# Patient Record
Sex: Male | Born: 1970
Health system: Southern US, Community
[De-identification: ages and names within clinical notes are randomized; demographics above are authoritative.]

## PROBLEM LIST (undated history)

## (undated) DIAGNOSIS — E119 Type 2 diabetes mellitus without complications: Secondary | ICD-10-CM

## (undated) DIAGNOSIS — I10 Essential (primary) hypertension: Secondary | ICD-10-CM

---

## 2017-05-26 ENCOUNTER — Ambulatory Visit (HOSPITAL_COMMUNITY)
Admission: EM | Admit: 2017-05-26 | Discharge: 2017-05-26 | Disposition: A | Discharge status: Home / Self Care | Payer: Self-pay | Attending: Family Medicine | Admitting: Family Medicine

## 2017-05-26 ENCOUNTER — Encounter (HOSPITAL_COMMUNITY): Payer: Self-pay | Admitting: Emergency Medicine

## 2017-05-26 DIAGNOSIS — I10 Essential (primary) hypertension: Secondary | ICD-10-CM

## 2017-05-26 DIAGNOSIS — E119 Type 2 diabetes mellitus without complications: Secondary | ICD-10-CM

## 2017-05-26 HISTORY — DX: Type 2 diabetes mellitus without complications: E11.9

## 2017-05-26 HISTORY — DX: Essential (primary) hypertension: I10

## 2017-05-26 NOTE — Discharge Instructions (Signed)
I have attached the contact information for [facility], contact them to schedule an appointment to establish for primary care.   For your diabetes and hypertension, I recommend eating fresh fruits and vegetables, avoid high salt foods, fried foods, increase daily exercise, and drink lots of plain water, avoid sweet tea.  You will need to follow up with a primary care provider for labs and to start medications.

## 2017-05-26 NOTE — ED Triage Notes (Signed)
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  Documentation shows high blood pressure and abnormal blood glucose readings.  Sent to this location for treatment

## 2017-05-26 NOTE — ED Provider Notes (Signed)
CSN: [id number]     Arrival date & time 05/26/17  1643 History   First MD Initiated Contact with Patient 05/26/17 1805     Chief Complaint  Patient presents with  . Hypertension  . Diabetes   (Consider location/radiation/quality/duration/timing/severity/associated sxs/prior Treatment) Paul Roberson is a 46 y.o. male with past hx DMII and HTN who [facility] with a chief complaint of unctrolled DM and HTN. 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 HgbA1c 8.2, BP at their clinic 170/120. Denies any symptoms. Was previously on medication several years ago but stopped after having acute renal failure.    The history is provided by the patient.  Hypertension   Diabetes     Past Medical History:  Diagnosis Date  . Diabetes mellitus without complication (HCC)   . Hypertension    No past surgical history on file. No family history on file. Social History  Substance Use Topics  . Smoking status: Current Some Day Smoker  . Smokeless tobacco: Not on file  . Alcohol use Yes    Review of Systems  Constitutional: Negative.   HENT: Negative.   Respiratory: Negative.   Cardiovascular: Negative.   Gastrointestinal: Negative.   Musculoskeletal: Negative.   Skin: Negative.   Neurological: Negative.     Allergies  Patient has no known allergies.  Home Medications   Prior to Admission medications   Not on File   Meds Ordered and Administered this Visit  Medications - No data to display  BP (!) 155/90 (BP Location: Right Arm) Comment: rn notified  Pulse (!) 109 Comment: notified rn  Temp 98.7 F (37.1 C) (Oral)   Resp 16   SpO2 100%  No data found.   Physical Exam  Constitutional: He is oriented to person, place, and time. He appears well-developed and well-nourished. No distress.  HENT:  Head: Normocephalic and atraumatic.  Right Ear: External ear normal.  Left Ear: External ear normal.  Eyes:  Conjunctivae are normal.  Neck: Normal range of motion.  Cardiovascular: Normal rate and regular rhythm.   Pulmonary/Chest: Effort normal and breath sounds normal.  Musculoskeletal: He exhibits no edema.  Neurological: He is alert and oriented to person, place, and time.  Skin: Skin is warm and dry. Capillary refill takes less than 2 seconds. He is not diaphoretic.  Psychiatric: He has a normal mood and affect. His behavior is normal.  Nursing note and vitals reviewed.   Urgent Care Course     Procedures (including critical care time)  Labs Review Labs Reviewed - No data to display  Imaging Review No results found.     MDM   1. Essential hypertension   2. Type 2 diabetes mellitus without complication, without long-term current use of insulin (HCC)     Pt referred to [facility] to establish for primary care. Declined to start treatment at this time, follow up with a PCP for management. Provided counseling on non-pharm management of these diseases such as diet and exercise.    Dorena BodoKennard, Karron Alvizo, NP 05/26/17 [phone]

## 2017-05-26 NOTE — ED Notes (Signed)
Patient was made aware at in take that providers would evaluated dm and htn including give guidance to securing a pcp.  Patient aware that paperwork would be providers discretion and NOT a certainty.

## 2017-05-28 ENCOUNTER — Ambulatory Visit (INDEPENDENT_AMBULATORY_CARE_PROVIDER_SITE_OTHER): Payer: Self-pay | Admitting: Emergency Medicine

## 2017-05-28 ENCOUNTER — Telehealth: Payer: Self-pay

## 2017-05-28 ENCOUNTER — Encounter: Payer: Self-pay | Admitting: Emergency Medicine

## 2017-05-28 VITALS — BP 134/90 | HR 69 | Temp 98.2°F | Resp 16 | Ht 69.0 in | Wt 238.4 lb

## 2017-05-28 DIAGNOSIS — E119 Type 2 diabetes mellitus without complications: Secondary | ICD-10-CM

## 2017-05-28 DIAGNOSIS — E1159 Type 2 diabetes mellitus with other circulatory complications: Secondary | ICD-10-CM | POA: Insufficient documentation

## 2017-05-28 DIAGNOSIS — I10 Essential (primary) hypertension: Secondary | ICD-10-CM

## 2017-05-28 MED ORDER — AMLODIPINE BESYLATE 5 MG PO TABS: 5.0000 mg | ORAL_TABLET | Freq: Every day | ORAL | 3 refills | Status: DC

## 2017-05-28 MED ORDER — METFORMIN HCL 500 MG PO TABS: 500.0000 mg | ORAL_TABLET | Freq: Two times a day (BID) | ORAL | 3 refills | Status: DC

## 2017-05-28 NOTE — Progress Notes (Signed)
Paul Roberson 46 y.o.   Chief Complaint  Patient presents with  . Establish Care    Sugar and BP check    HISTORY OF PRESENT ILLNESS: This is a 46 y.o. 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 Asymptomatic. BP was 170/120 and A1C was 8.7 with glucose 260.  HPI   Prior to Admission medications   Not on File    Allergies  Allergen Reactions  . Penicillins Itching    There are no active problems to display for this patient.   Past Medical History:  Diagnosis Date  . Diabetes mellitus without complication (HCC)   . Hypertension     History reviewed. No pertinent surgical history.  Social History   Social History  . Marital status: Married    Spouse name: N/A  . Number of children: N/A  . Years of education: N/A   Occupational History  . Not on file.   Social History Main Topics  . Smoking status: Current Some Day Smoker  . Smokeless tobacco: Never Used  . Alcohol use Yes  . Drug use: No  . Sexual activity: Not on file   Other Topics Concern  . Not on file   Social History Narrative  . No narrative on file    History reviewed. No pertinent family history.   Review of Systems  Constitutional: Negative.  Negative for chills and fever.  HENT: Negative.   Eyes: Negative.  Negative for blurred vision and double vision.  Respiratory: Negative.  Negative for cough, shortness of breath and wheezing.   Cardiovascular: Negative.  Negative for chest pain, palpitations, claudication and leg swelling.  Gastrointestinal: Negative.  Negative for abdominal pain, nausea and vomiting.  Genitourinary: Negative.  Negative for dysuria and hematuria.  Musculoskeletal: Negative.  Negative for back pain, myalgias and neck pain.  Skin: Negative.  Negative for rash.       Rash to both feet.  Neurological: Negative.  Negative for dizziness, sensory change, focal weakness and headaches.    Endo/Heme/Allergies: Negative.   All other systems reviewed and are negative.  Vitals:   05/28/17 0812  BP: 134/90  Pulse: 69  Resp: 16  Temp: 98.2 F (36.8 C)     Physical Exam  Constitutional: He is oriented to person, place, and time. He appears well-developed and well-nourished.  HENT:  Head: Normocephalic and atraumatic.  Nose: Nose normal.  Mouth/Throat: Oropharynx is clear and moist. No oropharyngeal exudate.  Eyes: Pupils are equal, round, and reactive to light. Conjunctivae and EOM are normal.  Neck: Normal range of motion. Neck supple. No JVD present. No thyromegaly present.  Cardiovascular: Normal rate, regular rhythm, normal heart sounds and intact distal pulses.   Pulmonary/Chest: Effort normal and breath sounds normal.  Abdominal: Soft. Bowel sounds are normal. He exhibits no distension. There is no tenderness.  Musculoskeletal: Normal range of motion.  Lymphadenopathy:    He has no cervical adenopathy.  Neurological: He is alert and oriented to person, place, and time. No sensory deficit. He exhibits normal muscle tone.  Skin: Skin is warm and dry. Capillary refill takes less than 2 seconds. Rash (both feet; tinea pedis) noted.  Psychiatric: He has a normal mood and affect. His behavior is normal.  Vitals reviewed.    ASSESSMENT & PLAN: Paul Roberson was seen today for establish care.  Diagnoses and all orders for this visit:  Essential hypertension -  Basic metabolic panel -     CBC -     amLODipine (NORVASC) 5 MG tablet; Take 1 tablet (5 mg total) by mouth daily.  Type 2 diabetes mellitus without complication, without long-term current use of insulin (HCC) -     Hemoglobin A1c -     metFORMIN (GLUCOPHAGE) 500 MG tablet; Take 1 tablet (500 mg total) by mouth 2 (two) times daily with a meal.    Patient Instructions    IF you received an x-ray today, you will receive an invoice from [facility]. Please contact [facility] at  [phone] with questions or concerns regarding your invoice.   IF you received labwork today, you will receive an invoice from [facility]. Please contact [facility] at [phone] with questions or concerns regarding your invoice.   Our billing staff will not be able to assist you with questions regarding bills from these companies.  You will be contacted with the lab results as soon as they are available. The fastest way to get your results is to activate your My Chart account. Instructions are located on the last page of this paperwork. If you have not heard from [place] regarding the results in 2 weeks, please contact this office.      Type 2 Diabetes Mellitus, Diagnosis, Adult Type 2 diabetes (type 2 diabetes mellitus) is a long-term (chronic) disease. It may be caused by one or both of these problems:  Your body does not make enough of a hormone called insulin.  Your body does not react in a normal way to insulin that it makes.  Insulin lets sugars (glucose) go into cells in the body. This gives you energy. If you have type 2 diabetes, sugars cannot get into cells. This causes high blood sugar (hyperglycemia). Your doctor will set treatment goals for you. Generally, you should have these blood sugar levels:  Before meals (preprandial): 80-130 mg/dL (9.5-6.2 mmol/L).  After meals (postprandial): below 180 mg/dL (10 mmol/L).  A1c (hemoglobin A1c) level: less than 7%.  Follow these instructions at home: Questions to Ask Your Doctor  You may want to ask these questions:  Do I need to meet with a diabetes educator?  Where can I find a support group for people with diabetes?  What equipment will I need to care for myself at home?  What diabetes medicines do I need? When should I take them?  How often do I need to check my blood sugar?  What number can I call if I have questions?  When is my next doctor's visit?  General instructions  Take over-the-counter and prescription  medicines only as told by your doctor.  Keep all follow-up visits as told by your doctor. This is important. Contact a doctor if:  Your blood sugar is at or above 240 mg/dL (13.0 mmol/L) for 2 days in a row.  You have been sick or have had a fever for 2 days or more and you are not getting better.  You have any of these problems for more than 6 hours: ? You cannot eat or drink. ? You feel sick to your stomach (nauseous). ? You throw up (vomit). ? You have watery poop (diarrhea). Get help right away if:  Your blood sugar is lower than 54 mg/dL (3 mmol/L).  You get confused.  You have trouble: ? Thinking clearly. ? Breathing.  You have moderate or large ketone levels in your pee (urine). This information is not intended to replace advice given to you by  your health care provider. Make sure you discuss any questions you have with your health care provider. Document Released: 08/12/2008 Document Revised: 04/10/2016 Document Reviewed: 12/07/2015 Elsevier Interactive Patient Education  2018 [employer]  Hypertension Hypertension is another name for high blood pressure. High blood pressure forces your heart to work harder to pump blood. This can cause problems over time. There are two numbers in a blood pressure reading. There is a top number (systolic) over a bottom number (diastolic). It is best to have a blood pressure below 120/80. Healthy choices can help lower your blood pressure. You may need medicine to help lower your blood pressure if:  Your blood pressure cannot be lowered with healthy choices.  Your blood pressure is higher than 130/80.  Follow these instructions at home: Eating and drinking  If directed, follow the DASH eating plan. This diet includes: ? Filling half of your plate at each meal with fruits and vegetables. ? Filling one quarter of your plate at each meal with whole grains. Whole grains include whole wheat pasta, brown rice, and whole grain  bread. ? Eating or drinking low-fat dairy products, such as skim milk or low-fat yogurt. ? Filling one quarter of your plate at each meal with low-fat (lean) proteins. Low-fat proteins include fish, skinless chicken, eggs, beans, and tofu. ? Avoiding fatty meat, cured and processed meat, or chicken with skin. ? Avoiding premade or processed food.  Eat less than 1,500 mg of salt (sodium) a day.  Limit alcohol use to no more than 1 drink a day for nonpregnant women and 2 drinks a day for men. One drink equals 12 oz of beer, 5 oz of wine, or 1 oz of hard liquor. Lifestyle  Work with your doctor to stay at a healthy weight or to lose weight. Ask your doctor what the best weight is for you.  Get at least 30 minutes of exercise that causes your heart to beat faster (aerobic exercise) most days of the week. This may include walking, swimming, or biking.  Get at least 30 minutes of exercise that strengthens your muscles (resistance exercise) at least 3 days a week. This may include lifting weights or pilates.  Do not use any products that contain nicotine or tobacco. This includes cigarettes and e-cigarettes. If you need help quitting, ask your doctor.  Check your blood pressure at home as told by your doctor.  Keep all follow-up visits as told by your doctor. This is important. Medicines  Take over-the-counter and prescription medicines only as told by your doctor. Follow directions carefully.  Do not skip doses of blood pressure medicine. The medicine does not work as well if you skip doses. Skipping doses also puts you at risk for problems.  Ask your doctor about side effects or reactions to medicines that you should watch for. Contact a doctor if:  You think you are having a reaction to the medicine you are taking.  You have headaches that keep coming back (recurring).  You feel dizzy.  You have swelling in your ankles.  You have trouble with your vision. Get help right away  if:  You get a very bad headache.  You start to feel confused.  You feel weak or numb.  You feel faint.  You get very bad pain in your: ? Chest. ? Belly (abdomen).  You throw up (vomit) more than once.  You have trouble breathing. Summary  Hypertension is another name for high blood pressure.  Making healthy choices  can help lower blood pressure. If your blood pressure cannot be controlled with healthy choices, you may need to take medicine. This information is not intended to replace advice given to you by your health care provider. Make sure you discuss any questions you have with your health care provider. Document Released: 04/21/2008 Document Revised: 10/01/2016 Document Reviewed: 10/01/2016 Elsevier Interactive Patient Education  2018 Elsevier Inc.      Edwina Barth, MD [facility]

## 2017-05-28 NOTE — Telephone Encounter (Signed)
Pt states that the pharmacy had incorrect DOB. Called pharmacy to resolve for Rx. Pharmacy was able to correct.

## 2017-05-28 NOTE — Patient Instructions (Addendum)
   IF you received an x-ray today, you will receive an invoice from [facility]. Please contact [facility] at [phone] with questions or concerns regarding your invoice.   IF you received labwork today, you will receive an invoice from [facility]. Please contact [facility] at [phone] with questions or concerns regarding your invoice.   Our billing staff will not be able to assist you with questions regarding bills from these companies.  You will be contacted with the lab results as soon as they are available. The fastest way to get your results is to activate your My Chart account. Instructions are located on the last page of this paperwork. If you have not heard from us regarding the results in 2 weeks, please contact this office.      Type 2 Diabetes Mellitus, Diagnosis, Adult Type 2 diabetes (type 2 diabetes mellitus) is a long-term (chronic) disease. It may be caused by one or both of these problems:  Your body does not make enough of a hormone called insulin.  Your body does not react in a normal way to insulin that it makes.  Insulin lets sugars (glucose) go into cells in the body. This gives you energy. If you have type 2 diabetes, sugars cannot get into cells. This causes high blood sugar (hyperglycemia). Your doctor will set treatment goals for you. Generally, you should have these blood sugar levels:  Before meals (preprandial): 80-130 mg/dL (4.4-7.2 mmol/L).  After meals (postprandial): below 180 mg/dL (10 mmol/L).  A1c (hemoglobin A1c) level: less than 7%.  Follow these instructions at home: Questions to Ask Your Doctor  You may want to ask these questions:  Do I need to meet with a diabetes educator?  Where can I find a support group for people with diabetes?  What equipment will I need to care for myself at home?  What diabetes medicines do I need? When should I take them?  How often do I need to check my blood sugar?  What number  can I call if I have questions?  When is my next doctor's visit?  General instructions  Take over-the-counter and prescription medicines only as told by your doctor.  Keep all follow-up visits as told by your doctor. This is important. Contact a doctor if:  Your blood sugar is at or above 240 mg/dL (13.3 mmol/L) for 2 days in a row.  You have been sick or have had a fever for 2 days or more and you are not getting better.  You have any of these problems for more than 6 hours: ? You cannot eat or drink. ? You feel sick to your stomach (nauseous). ? You throw up (vomit). ? You have watery poop (diarrhea). Get help right away if:  Your blood sugar is lower than 54 mg/dL (3 mmol/L).  You get confused.  You have trouble: ? Thinking clearly. ? Breathing.  You have moderate or large ketone levels in your pee (urine). This information is not intended to replace advice given to you by your health care provider. Make sure you discuss any questions you have with your health care provider. Document Released: 08/12/2008 Document Revised: 04/10/2016 Document Reviewed: 12/07/2015 Elsevier Interactive Patient Education  2018 [employer] Inc.  Hypertension Hypertension is another name for high blood pressure. High blood pressure forces your heart to work harder to pump blood. This can cause problems over time. There are two numbers in a blood pressure reading. There is a top number (systolic) over a bottom   number (diastolic). It is best to have a blood pressure below 120/80. Healthy choices can help lower your blood pressure. You may need medicine to help lower your blood pressure if:  Your blood pressure cannot be lowered with healthy choices.  Your blood pressure is higher than 130/80.  Follow these instructions at home: Eating and drinking  If directed, follow the DASH eating plan. This diet includes: ? Filling half of your plate at each meal with fruits and vegetables. ? Filling one  quarter of your plate at each meal with whole grains. Whole grains include whole wheat pasta, brown rice, and whole grain bread. ? Eating or drinking low-fat dairy products, such as skim milk or low-fat yogurt. ? Filling one quarter of your plate at each meal with low-fat (lean) proteins. Low-fat proteins include fish, skinless chicken, eggs, beans, and tofu. ? Avoiding fatty meat, cured and processed meat, or chicken with skin. ? Avoiding premade or processed food.  Eat less than 1,500 mg of salt (sodium) a day.  Limit alcohol use to no more than 1 drink a day for nonpregnant women and 2 drinks a day for men. One drink equals 12 oz of beer, 5 oz of wine, or 1 oz of hard liquor. Lifestyle  Work with your doctor to stay at a healthy weight or to lose weight. Ask your doctor what the best weight is for you.  Get at least 30 minutes of exercise that causes your heart to beat faster (aerobic exercise) most days of the week. This may include walking, swimming, or biking.  Get at least 30 minutes of exercise that strengthens your muscles (resistance exercise) at least 3 days a week. This may include lifting weights or pilates.  Do not use any products that contain nicotine or tobacco. This includes cigarettes and e-cigarettes. If you need help quitting, ask your doctor.  Check your blood pressure at home as told by your doctor.  Keep all follow-up visits as told by your doctor. This is important. Medicines  Take over-the-counter and prescription medicines only as told by your doctor. Follow directions carefully.  Do not skip doses of blood pressure medicine. The medicine does not work as well if you skip doses. Skipping doses also puts you at risk for problems.  Ask your doctor about side effects or reactions to medicines that you should watch for. Contact a doctor if:  You think you are having a reaction to the medicine you are taking.  You have headaches that keep coming back  (recurring).  You feel dizzy.  You have swelling in your ankles.  You have trouble with your vision. Get help right away if:  You get a very bad headache.  You start to feel confused.  You feel weak or numb.  You feel faint.  You get very bad pain in your: ? Chest. ? Belly (abdomen).  You throw up (vomit) more than once.  You have trouble breathing. Summary  Hypertension is another name for high blood pressure.  Making healthy choices can help lower blood pressure. If your blood pressure cannot be controlled with healthy choices, you may need to take medicine. This information is not intended to replace advice given to you by your health care provider. Make sure you discuss any questions you have with your health care provider. Document Released: 04/21/2008 Document Revised: 10/01/2016 Document Reviewed: 10/01/2016 Elsevier Interactive Patient Education  2018 [employer]vier Inc.  

## 2017-05-29 LAB — HEMOGLOBIN A1C
ESTIMATED AVERAGE GLUCOSE: 235 mg/dL
Hgb A1c MFr Bld: 9.8 % — ABNORMAL HIGH (ref 4.8–5.6)

## 2017-05-29 LAB — BASIC METABOLIC PANEL
BUN / CREAT RATIO: 14 (ref 9–20)
BUN: 17 mg/dL (ref 6–24)
CHLORIDE: 103 mmol/L (ref 96–106)
CO2: 21 mmol/L (ref 20–29)
Calcium: 9.7 mg/dL (ref 8.7–10.2)
Creatinine, Ser: 1.18 mg/dL (ref 0.76–1.27)
GFR calc Af Amer: 87 mL/min/{1.73_m2} (ref >59)
GFR calc non Af Amer: 75 mL/min/{1.73_m2} (ref >59)
GLUCOSE: 281 mg/dL — AB (ref 65–99)
Potassium: 4.6 mmol/L (ref 3.5–5.2)
SODIUM: 141 mmol/L (ref 134–144)

## 2017-05-29 LAB — CBC
Hematocrit: 38.8 % (ref 37.5–51.0)
Hemoglobin: 12.5 g/dL — ABNORMAL LOW (ref 13.0–17.7)
MCH: 27.6 pg (ref 26.6–33.0)
MCHC: 32.2 g/dL (ref 31.5–35.7)
MCV: 86 fL (ref 79–97)
PLATELETS: 293 10*3/uL (ref 150–379)
RBC: 4.53 x10E6/uL (ref 4.14–5.80)
RDW: 15.4 % (ref 12.3–15.4)
WBC: 6.5 10*3/uL (ref 3.4–10.8)

## 2017-06-26 ENCOUNTER — Ambulatory Visit: Payer: Self-pay | Admitting: Emergency Medicine

## 2017-07-30 ENCOUNTER — Encounter: Payer: Self-pay | Admitting: Physician Assistant

## 2017-07-30 ENCOUNTER — Ambulatory Visit (INDEPENDENT_AMBULATORY_CARE_PROVIDER_SITE_OTHER): Payer: BLUE CROSS/BLUE SHIELD | Admitting: Physician Assistant

## 2017-07-30 VITALS — BP 147/95 | HR 109 | Resp 16 | Ht 69.0 in | Wt 239.2 lb

## 2017-07-30 DIAGNOSIS — E119 Type 2 diabetes mellitus without complications: Secondary | ICD-10-CM | POA: Diagnosis not present

## 2017-07-30 LAB — POCT GLYCOSYLATED HEMOGLOBIN (HGB A1C): Hemoglobin A1C: 9.5

## 2017-07-30 LAB — POCT URINALYSIS DIPSTICK
Bilirubin, UA: NEGATIVE
Blood, UA: NEGATIVE
GLUCOSE UA: NEGATIVE
Ketones, UA: NEGATIVE
NITRITE UA: NEGATIVE
Spec Grav, UA: >=1.03 — AB (ref 1.010–1.025)
UROBILINOGEN UA: 0.2 U/dL
pH, UA: 5.5 (ref 5.0–8.0)

## 2017-07-30 MED ORDER — AMLODIPINE BESYLATE 10 MG PO TABS: 10.0000 mg | ORAL_TABLET | Freq: Every day | ORAL | 3 refills | Status: DC

## 2017-07-30 MED ORDER — METFORMIN HCL 1000 MG PO TABS: 1000.0000 mg | ORAL_TABLET | Freq: Two times a day (BID) | ORAL | 3 refills | Status: DC

## 2017-07-30 NOTE — Patient Instructions (Signed)
Start the new metformin and try not to miss doses.  Start amlodipine 10 mg and try not to miss doses.

## 2017-07-30 NOTE — Progress Notes (Signed)
07/31/2017 11:25 AM   DOB: February 10, 1971 / MRN: [id number]  SUBJECTIVE:  Paul Roberson is a 46 y.o. male presenting for a1c recheck. Is taking Norvasc 5 mg and Metofomin 500.  Tells me that he has lost 60 lbs this year 2/2 dieting.  Tells me he has been taking 1 metformin daily.  Continues smoking. Takes norvasc daily.  Remains asymptomatic. No acute changes in vision. Mother here today and is requesting patient be started on multiple medications as she is a diabetic as well.   He is allergic to penicillins.   He  has a past medical history of Diabetes mellitus without complication (HCC) and Hypertension.    He  reports that he has been smoking.  He has never used smokeless tobacco. He reports that he drinks alcohol. He reports that he does not use drugs. He  has no sexual activity history on file. The patient  has no past surgical history on file.  His family history is not on file.  Review of Systems  Constitutional: Negative for chills, diaphoresis and fever.  Respiratory: Negative for cough, hemoptysis, sputum production, shortness of breath and wheezing.   Cardiovascular: Negative for chest pain, orthopnea and leg swelling.  Gastrointestinal: Negative for abdominal pain, blood in stool, constipation, diarrhea, heartburn, melena, nausea and vomiting.  Genitourinary: Negative for flank pain.  Skin: Negative for rash.  Neurological: Negative for dizziness.    The problem list and medications were reviewed and updated by myself where necessary and exist elsewhere in the encounter.   OBJECTIVE:  BP (!) 147/95 (BP Location: Right Arm, Patient Position: Sitting, Cuff Size: Large)   Pulse (!) 109   Resp 16   Ht  (1.753 m)   Wt 239 lb 3.2 oz (108.5 kg)   SpO2 98%   BMI 35.32 kg/m   Pulse Readings from Last 3 Encounters:  07/30/17 (!) 109  05/28/17 69  05/26/17 (!) 109   Lab Results  Component Value Date   CHOL 169 07/30/2017   HDL 48 07/30/2017   LDLCALC 88 07/30/2017   TRIG 163 (H) 07/30/2017   CHOLHDL 3.5 07/30/2017     Lab Results  Component Value Date   HGBA1C 9.5 07/30/2017     Physical Exam  Constitutional: He is oriented to person, place, and time. He appears well-developed. He is active and cooperative.  Non-toxic appearance.  Eyes: Pupils are equal, round, and reactive to light. EOM are normal.  Cardiovascular: Normal rate, regular rhythm, S1 normal, S2 normal, normal heart sounds, intact distal pulses and normal pulses.  Exam reveals no gallop and no friction rub.   No murmur heard. Pulmonary/Chest: Effort normal. No stridor. No tachypnea. No respiratory distress. He has no wheezes. He has no rales.  Abdominal: He exhibits no distension.  Musculoskeletal: He exhibits no edema.  Neurological: He is alert and oriented to person, place, and time. He has normal strength and normal reflexes. He is not disoriented. No cranial nerve deficit or sensory deficit. He exhibits normal muscle tone. Coordination and gait normal.  Skin: Skin is warm and dry. Rash (skn breakdown about the bilateral feet, no open wounds) noted. He is not diaphoretic. No pallor.  Psychiatric: His behavior is normal.  Vitals reviewed.   Results for orders placed or performed in visit on 07/30/17 (from the past 72 hour(s))  POCT glycosylated hemoglobin (Hb A1C)     Status: None   Collection Time: 07/30/17  3:05 PM  Result Value Ref Range  Hemoglobin A1C 9.5   POCT urinalysis dipstick     Status: Abnormal   Collection Time: 07/30/17  3:10 PM  Result Value Ref Range   Color, UA yellow    Clarity, UA clear    Glucose, UA negative    Bilirubin, UA negative    Ketones, UA negative    Spec Grav, UA >=1.030 (A) 1.010 - 1.025   Blood, UA negative    pH, UA 5.5 5.0 - 8.0   Protein, UA trace    Urobilinogen, UA 0.2 0.2 or 1.0 E.U./dL   Nitrite, UA negative    Leukocytes, UA Trace (A) Negative  Lipid Panel     Status: Abnormal   Collection Time: 07/30/17  3:49 PM  Result  Value Ref Range   Cholesterol, Total 169 100 - 199 mg/dL   Triglycerides 161163 (H) 0 - 149 mg/dL   HDL 48 >09>39 mg/dL   VLDL Cholesterol Cal 33 5 - 40 mg/dL   LDL Calculated 88 0 - 99 mg/dL   Chol/HDL Ratio 3.5 0.0 - 5.0 ratio    Comment:                                   T. Chol/HDL Ratio                                             Men  Women                               1/2 Avg.Risk  3.4    3.3                                   Avg.Risk  5.0    4.4                                2X Avg.Risk  9.6    7.1                                3X Avg.Risk 23.4   11.0    Lab Results  Component Value Date   CREATININE 1.18 05/28/2017   BUN 17 05/28/2017   NA 141 05/28/2017   K 4.6 05/28/2017   CL 103 05/28/2017   CO2 21 05/28/2017     No results found.  ASSESSMENT AND PLAN:  Paul Roberson was seen today for diabetes.  Diagnoses and all orders for this visit:  Type 2 diabetes mellitus without complication, without long-term current use of insulin (HCC): Smoker.  Hypertensive.  Uncontrolled diabetic. He knew about his diabetes for at leaast 1/2 a year before seeking treatment.  Feet look terrible.  He does not want diabetes education.  Doubling metformin.  Doubling Norvasc. Will see him back in about three weeks. Will be starting a statin and ASA at that time.  I have urged him to quit smoking.  There may be a depression underlying his chronic health issues and Wellbutrin would be a reasonable step forward if it could help him stop smoking, reduce his appetite, and treat  an underlying dysthymia. I am sending him to podiatry. Will need optho.  -     POCT glycosylated hemoglobin (Hb A1C) -     Microalbumin, urine -     POCT urinalysis dipstick -     Lipid Panel -     amLODipine (NORVASC) 10 MG tablet; Take 1 tablet (10 mg total) by mouth daily. -     metFORMIN (GLUCOPHAGE) 1000 MG tablet; Take 1 tablet (1,000 mg total) by mouth 2 (two) times daily with a meal. -     Ambulatory referral to  Podiatry    The patient is advised to call or return to clinic if he does not see an improvement in symptoms, or to seek the care of the closest emergency department if he worsens with the above plan.   Deliah Boston, MHS, PA-C [facility] 07/31/2017 11:25 AM

## 2017-07-31 LAB — MICROALBUMIN, URINE: MICROALBUM., U, RANDOM: 41.4 ug/mL

## 2017-07-31 LAB — LIPID PANEL
Chol/HDL Ratio: 3.5 ratio (ref 0.0–5.0)
Cholesterol, Total: 169 mg/dL (ref 100–199)
HDL: 48 mg/dL (ref >39)
LDL Calculated: 88 mg/dL (ref 0–99)
TRIGLYCERIDES: 163 mg/dL — AB (ref 0–149)
VLDL Cholesterol Cal: 33 mg/dL (ref 5–40)

## 2017-08-21 ENCOUNTER — Encounter: Payer: Self-pay | Admitting: Emergency Medicine

## 2017-08-21 ENCOUNTER — Ambulatory Visit (INDEPENDENT_AMBULATORY_CARE_PROVIDER_SITE_OTHER): Payer: BLUE CROSS/BLUE SHIELD | Admitting: Emergency Medicine

## 2017-08-21 VITALS — BP 130/70 | HR 97 | Temp 98.7°F | Resp 16 | Ht 68.25 in | Wt 235.6 lb

## 2017-08-21 DIAGNOSIS — E119 Type 2 diabetes mellitus without complications: Secondary | ICD-10-CM

## 2017-08-21 DIAGNOSIS — I10 Essential (primary) hypertension: Secondary | ICD-10-CM | POA: Diagnosis not present

## 2017-08-21 LAB — POCT GLYCOSYLATED HEMOGLOBIN (HGB A1C): Hemoglobin A1C: 8.5

## 2017-08-21 NOTE — Patient Instructions (Addendum)
  Must take Metformin twice a day.   IF you received an x-ray today, you will receive an invoice from [facility]. Please contact [facility] at [phone] with questions or concerns regarding your invoice.   IF you received labwork today, you will receive an invoice from [place]. Please contact [facility] at [phone] with questions or concerns regarding your invoice.   Our billing staff will not be able to assist you with questions regarding bills from these companies.  You will be contacted with the lab results as soon as they are available. The fastest way to get your results is to activate your My Chart account. Instructions are located on the last page of this paperwork. If you have not heard from [place] regarding the results in 2 weeks, please contact this office.     Type 2 Diabetes Mellitus, Diagnosis, Adult Type 2 diabetes (type 2 diabetes mellitus) is a long-term (chronic) disease. It may be caused by one or both of these problems:  Your body does not make enough of a hormone called insulin.  Your body does not react in a normal way to insulin that it makes.  Insulin lets sugars (glucose) go into cells in the body. This gives you energy. If you have type 2 diabetes, sugars cannot get into cells. This causes high blood sugar (hyperglycemia). Your doctor will set treatment goals for you. Generally, you should have these blood sugar levels:  Before meals (preprandial): 80-130 mg/dL (1.3-0.8 mmol/L).  After meals (postprandial): below 180 mg/dL (10 mmol/L).  A1c (hemoglobin A1c) level: less than 7%.  Follow these instructions at home: Questions to Ask Your Doctor  You may want to ask these questions:  Do I need to meet with a diabetes educator?  Where can I find a support group for people with diabetes?  What equipment will I need to care for myself at home?  What diabetes medicines do I need? When should I take them?  How often do I need to check my  blood sugar?  What number can I call if I have questions?  When is my next doctor's visit?  General instructions  Take over-the-counter and prescription medicines only as told by your doctor.  Keep all follow-up visits as told by your doctor. This is important. Contact a doctor if:  Your blood sugar is at or above 240 mg/dL (65.7 mmol/L) for 2 days in a row.  You have been sick or have had a fever for 2 days or more and you are not getting better.  You have any of these problems for more than 6 hours: ? You cannot eat or drink. ? You feel sick to your stomach (nauseous). ? You throw up (vomit). ? You have watery poop (diarrhea). Get help right away if:  Your blood sugar is lower than 54 mg/dL (3 mmol/L).  You get confused.  You have trouble: ? Thinking clearly. ? Breathing.  You have moderate or large ketone levels in your pee (urine). This information is not intended to replace advice given to you by your health care provider. Make sure you discuss any questions you have with your health care provider. Document Released: 08/12/2008 Document Revised: 04/10/2016 Document Reviewed: 12/07/2015 Elsevier Interactive Patient Education  [employer].

## 2017-08-21 NOTE — Assessment & Plan Note (Signed)
A1C still high at 8.5 but less than before (9.5). Pt was taking Metformin once a day instead of bid. Advised to take it with food twice a day.

## 2017-08-21 NOTE — Progress Notes (Signed)
Paul Roberson 46 y.o.   Chief Complaint  Patient presents with  . Follow-up    check diabetes  and blood pressure    HISTORY OF PRESENT ILLNESS: This is a 46 y.o. male here for follow up of diabetes and HTN; taking Norvasc 10 mg daily but only taking Metformin once a day. Asymptomatic.   HPI   Prior to Admission medications   Medication Sig Start Date End Date Taking? Authorizing Provider  amLODipine (NORVASC) 10 MG tablet Take 1 tablet (10 mg total) by mouth daily. 07/30/17  Yes Ofilia Neas, PA-C  metFORMIN (GLUCOPHAGE) 1000 MG tablet Take 1 tablet (1,000 mg total) by mouth 2 (two) times daily with a meal. 07/30/17  Yes Ofilia Neas, PA-C    Allergies  Allergen Reactions  . Penicillins Itching    Patient Active Problem List   Diagnosis Date Noted  . Essential hypertension 05/28/2017  . Type 2 diabetes mellitus without complication, without long-term current use of insulin (HCC) 05/28/2017    Past Medical History:  Diagnosis Date  . Diabetes mellitus without complication (HCC)   . Hypertension     No past surgical history on file.  Social History   Social History  . Marital status: Married    Spouse name: N/A  . Number of children: N/A  . Years of education: N/A   Occupational History  . Not on file.   Social History Main Topics  . Smoking status: Current Some Day Smoker  . Smokeless tobacco: Never Used  . Alcohol use Yes  . Drug use: No  . Sexual activity: Not on file   Other Topics Concern  . Not on file   Social History Narrative  . No narrative on file    No family history on file.   Review of Systems  Constitutional: Negative.  Negative for chills and fever.  HENT: Negative.  Negative for nosebleeds and sore throat.   Eyes: Negative for blurred vision, double vision, discharge and redness.  Respiratory: Negative.  Negative for cough and shortness of breath.   Cardiovascular: Negative.  Negative for chest pain and leg swelling.    Gastrointestinal: Negative for abdominal pain, diarrhea, nausea and vomiting.  Genitourinary: Negative.  Negative for dysuria and hematuria.  Musculoskeletal: Negative for myalgias and neck pain.  Skin: Negative.  Negative for rash.  Neurological: Negative.  Negative for dizziness, sensory change, focal weakness and headaches.  Endo/Heme/Allergies: Negative.   All other systems reviewed and are negative.  Vitals:   08/21/17 1445  BP: 130/70  Pulse: 97  Resp: 16  Temp: 98.7 F (37.1 C)  SpO2: 100%     Physical Exam  Constitutional: He is oriented to person, place, and time. He appears well-developed and well-nourished.  HENT:  Head: Normocephalic and atraumatic.  Right Ear: External ear normal.  Left Ear: External ear normal.  Nose: Nose normal.  Mouth/Throat: Oropharynx is clear and moist.  Eyes: Pupils are equal, round, and reactive to light. Conjunctivae and EOM are normal.  Neck: Normal range of motion. Neck supple. No JVD present. No thyromegaly present.  Cardiovascular: Normal rate, regular rhythm, normal heart sounds and intact distal pulses.   Pulmonary/Chest: Effort normal and breath sounds normal.  Abdominal: Soft. Bowel sounds are normal. He exhibits no distension. There is no tenderness.  Musculoskeletal: Normal range of motion.  Lymphadenopathy:    He has no cervical adenopathy.  Neurological: He is alert and oriented to person, place, and time. No sensory deficit. He  exhibits normal muscle tone.  Skin: Skin is warm and dry. Capillary refill takes less than 2 seconds. No rash noted.  Psychiatric: He has a normal mood and affect. His behavior is normal.  Vitals reviewed.  Lab Results  Component Value Date   HGBA1C 8.5 08/21/2017   HGBA1C 9.5 07/30/2017   HGBA1C 9.8 (H) 05/28/2017   Lab Results  Component Value Date   LDLCALC 88 07/30/2017   CREATININE 1.18 05/28/2017   Results for orders placed or performed in visit on 08/21/17 (from the past 24  hour(s))  POCT glycosylated hemoglobin (Hb A1C)     Status: None   Collection Time: 08/21/17  3:27 PM  Result Value Ref Range   Hemoglobin A1C 8.5    Essential hypertension Target BP reached. Well controlled on 10 mg Norvasc.   Type 2 diabetes mellitus without complication, without long-term current use of insulin (HCC) A1C still high at 8.5 but less than before (9.5). Pt was taking Metformin once a day instead of bid. Advised to take it with food twice a day.   A total of 30 minutes was spent in the room with the patient, greater than 50% of which was in counseling/coordination of care regarding treatment of HTN and DM.    Follow up in 3 months.  ASSESSMENT & PLAN: Wen was seen today for follow-up.  Diagnoses and all orders for this visit:  Type 2 diabetes mellitus without complication, without long-term current use of insulin (HCC) -     POCT glycosylated hemoglobin (Hb A1C)  Essential hypertension    Patient Instructions    Must take Metformin twice a day.   IF you received an x-ray today, you will receive an invoice from [facility]. Please contact [facility] at [phone] with questions or concerns regarding your invoice.   IF you received labwork today, you will receive an invoice from [place]. Please contact [facility] at [phone] with questions or concerns regarding your invoice.   Our billing staff will not be able to assist you with questions regarding bills from these companies.  You will be contacted with the lab results as soon as they are available. The fastest way to get your results is to activate your My Chart account. Instructions are located on the last page of this paperwork. If you have not heard from [place] regarding the results in 2 weeks, please contact this office.     Type 2 Diabetes Mellitus, Diagnosis, Adult Type 2 diabetes (type 2 diabetes mellitus) is a long-term (chronic) disease. It may be caused by one or both of  these problems:  Your body does not make enough of a hormone called insulin.  Your body does not react in a normal way to insulin that it makes.  Insulin lets sugars (glucose) go into cells in the body. This gives you energy. If you have type 2 diabetes, sugars cannot get into cells. This causes high blood sugar (hyperglycemia). Your doctor will set treatment goals for you. Generally, you should have these blood sugar levels:  Before meals (preprandial): 80-130 mg/dL (8.4-1.3 mmol/L).  After meals (postprandial): below 180 mg/dL (10 mmol/L).  A1c (hemoglobin A1c) level: less than 7%.  Follow these instructions at home: Questions to Ask Your Doctor  You may want to ask these questions:  Do I need to meet with a diabetes educator?  Where can I find a support group for people with diabetes?  What equipment will I need to care for myself at home?  What diabetes medicines do I need? When should I take them?  How often do I need to check my blood sugar?  What number can I call if I have questions?  When is my next doctor's visit?  General instructions  Take over-the-counter and prescription medicines only as told by your doctor.  Keep all follow-up visits as told by your doctor. This is important. Contact a doctor if:  Your blood sugar is at or above 240 mg/dL (19.1 mmol/L) for 2 days in a row.  You have been sick or have had a fever for 2 days or more and you are not getting better.  You have any of these problems for more than 6 hours: ? You cannot eat or drink. ? You feel sick to your stomach (nauseous). ? You throw up (vomit). ? You have watery poop (diarrhea). Get help right away if:  Your blood sugar is lower than 54 mg/dL (3 mmol/L).  You get confused.  You have trouble: ? Thinking clearly. ? Breathing.  You have moderate or large ketone levels in your pee (urine). This information is not intended to replace advice given to you by your health care  provider. Make sure you discuss any questions you have with your health care provider. Document Released: 08/12/2008 Document Revised: 04/10/2016 Document Reviewed: 12/07/2015 Elsevier Interactive Patient Education  2018 [employer]      Edwina Barth, MD [facility]

## 2017-08-21 NOTE — Assessment & Plan Note (Signed)
Target BP reached. Well controlled on 10 mg Norvasc.

## 2018-06-26 ENCOUNTER — Ambulatory Visit: Payer: BLUE CROSS/BLUE SHIELD | Admitting: Emergency Medicine

## 2018-06-29 ENCOUNTER — Other Ambulatory Visit: Payer: Self-pay

## 2018-06-29 ENCOUNTER — Encounter: Payer: Self-pay | Admitting: Family Medicine

## 2018-06-29 ENCOUNTER — Ambulatory Visit (INDEPENDENT_AMBULATORY_CARE_PROVIDER_SITE_OTHER): Payer: 59 | Admitting: Family Medicine

## 2018-06-29 ENCOUNTER — Telehealth: Payer: Self-pay | Admitting: Family Medicine

## 2018-06-29 VITALS — BP 138/90 | HR 100 | Temp 98.4°F | Ht 69.88 in | Wt 230.8 lb

## 2018-06-29 DIAGNOSIS — E119 Type 2 diabetes mellitus without complications: Secondary | ICD-10-CM

## 2018-06-29 DIAGNOSIS — Z23 Encounter for immunization: Secondary | ICD-10-CM | POA: Diagnosis not present

## 2018-06-29 DIAGNOSIS — I10 Essential (primary) hypertension: Secondary | ICD-10-CM | POA: Diagnosis not present

## 2018-06-29 DIAGNOSIS — G5701 Lesion of sciatic nerve, right lower limb: Secondary | ICD-10-CM

## 2018-06-29 DIAGNOSIS — Z114 Encounter for screening for human immunodeficiency virus [HIV]: Secondary | ICD-10-CM | POA: Diagnosis not present

## 2018-06-29 LAB — POCT GLYCOSYLATED HEMOGLOBIN (HGB A1C): Hemoglobin A1C: 11.5 % — AB (ref 4.0–5.6)

## 2018-06-29 MED ORDER — AMLODIPINE BESYLATE 10 MG PO TABS: 10.0000 mg | ORAL_TABLET | Freq: Every day | ORAL | 1 refills | Status: DC

## 2018-06-29 MED ORDER — CYCLOBENZAPRINE HCL 10 MG PO TABS: 10.0000 mg | ORAL_TABLET | Freq: Three times a day (TID) | ORAL | 0 refills | Status: DC | PRN

## 2018-06-29 MED ORDER — DULAGLUTIDE 0.75 MG/0.5ML ~~LOC~~ SOAJ: 0.7500 mg | SUBCUTANEOUS | 2 refills | Status: DC

## 2018-06-29 MED ORDER — IBUPROFEN 600 MG PO TABS: 600.0000 mg | ORAL_TABLET | Freq: Three times a day (TID) | ORAL | 0 refills | Status: DC | PRN

## 2018-06-29 MED ORDER — METFORMIN HCL ER (MOD) 1000 MG PO TB24: 1000.0000 mg | ORAL_TABLET | Freq: Every day | ORAL | 2 refills | Status: DC

## 2018-06-29 NOTE — Patient Instructions (Addendum)
   IF you received an x-ray today, you will receive an invoice from [facility]. Please contact [facility] at [phone] with questions or concerns regarding your invoice.   IF you received labwork today, you will receive an invoice from [facility]. Please contact [facility] at [phone] with questions or concerns regarding your invoice.   Our billing staff will not be able to assist you with questions regarding bills from these companies.  You will be contacted with the lab results as soon as they are available. The fastest way to get your results is to activate your My Chart account. Instructions are located on the last page of this paperwork. If you have not heard from us regarding the results in 2 weeks, please contact this office.     Diabetes Mellitus and Nutrition When you have diabetes (diabetes mellitus), it is very important to have healthy eating habits because your blood sugar (glucose) levels are greatly affected by what you eat and drink. Eating healthy foods in the appropriate amounts, at about the same times every day, can help you:  Control your blood glucose.  Lower your risk of heart disease.  Improve your blood pressure.  Reach or maintain a healthy weight.  Every person with diabetes is different, and each person has different needs for a meal plan. Your health care provider may recommend that you work with a diet and nutrition specialist (dietitian) to make a meal plan that is best for you. Your meal plan may vary depending on factors such as:  The calories you need.  The medicines you take.  Your weight.  Your blood glucose, blood pressure, and cholesterol levels.  Your activity level.  Other health conditions you have, such as heart or kidney disease.  How do carbohydrates affect me? Carbohydrates affect your blood glucose level more than any other type of food. Eating carbohydrates naturally increases the amount of glucose in your  blood. Carbohydrate counting is a method for keeping track of how many carbohydrates you eat. Counting carbohydrates is important to keep your blood glucose at a healthy level, especially if you use insulin or take certain oral diabetes medicines. It is important to know how many carbohydrates you can safely have in each meal. This is different for every person. Your dietitian can help you calculate how many carbohydrates you should have at each meal and for snack. Foods that contain carbohydrates include:  Bread, cereal, rice, pasta, and crackers.  Potatoes and corn.  Peas, beans, and lentils.  Milk and yogurt.  Fruit and juice.  Desserts, such as cakes, cookies, ice cream, and candy.  How does alcohol affect me? Alcohol can cause a sudden decrease in blood glucose (hypoglycemia), especially if you use insulin or take certain oral diabetes medicines. Hypoglycemia can be a life-threatening condition. Symptoms of hypoglycemia (sleepiness, dizziness, and confusion) are similar to symptoms of having too much alcohol. If your health care provider says that alcohol is safe for you, follow these guidelines:  Limit alcohol intake to no more than 1 drink per day for nonpregnant women and 2 drinks per day for men. One drink equals 12 oz of beer, 5 oz of wine, or 1 oz of hard liquor.  Do not drink on an empty stomach.  Keep yourself hydrated with water, diet soda, or unsweetened iced tea.  Keep in mind that regular soda, juice, and other mixers may contain a lot of sugar and must be counted as carbohydrates.  What are tips for following   this plan? Reading food labels  Start by checking the serving size on the label. The amount of calories, carbohydrates, fats, and other nutrients listed on the label are based on one serving of the food. Many foods contain more than one serving per package.  Check the total grams (g) of carbohydrates in one serving. You can calculate the number of servings of  carbohydrates in one serving by dividing the total carbohydrates by 15. For example, if a food has 30 g of total carbohydrates, it would be equal to 2 servings of carbohydrates.  Check the number of grams (g) of saturated and trans fats in one serving. Choose foods that have low or no amount of these fats.  Check the number of milligrams (mg) of sodium in one serving. Most people should limit total sodium intake to less than 2,300 mg per day.  Always check the nutrition information of foods labeled as "low-fat" or "nonfat". These foods may be higher in added sugar or refined carbohydrates and should be avoided.  Talk to your dietitian to identify your daily goals for nutrients listed on the label. Shopping  Avoid buying canned, premade, or processed foods. These foods tend to be high in fat, sodium, and added sugar.  Shop around the outside edge of the grocery store. This includes fresh fruits and vegetables, bulk grains, fresh meats, and fresh dairy. Cooking  Use low-heat cooking methods, such as baking, instead of high-heat cooking methods like deep frying.  Cook using healthy oils, such as olive, canola, or sunflower oil.  Avoid cooking with butter, cream, or high-fat meats. Meal planning  Eat meals and snacks regularly, preferably at the same times every day. Avoid going long periods of time without eating.  Eat foods high in fiber, such as fresh fruits, vegetables, beans, and whole grains. Talk to your dietitian about how many servings of carbohydrates you can eat at each meal.  Eat 4-6 ounces of lean protein each day, such as lean meat, chicken, fish, eggs, or tofu. 1 ounce is equal to 1 ounce of meat, chicken, or fish, 1 egg, or 1/4 cup of tofu.  Eat some foods each day that contain healthy fats, such as avocado, nuts, seeds, and fish. Lifestyle   Check your blood glucose regularly.  Exercise at least 30 minutes 5 or more days each week, or as told by your health care  provider.  Take medicines as told by your health care provider.  Do not use any products that contain nicotine or tobacco, such as cigarettes and e-cigarettes. If you need help quitting, ask your health care provider.  Work with a counselor or diabetes educator to identify strategies to manage stress and any emotional and social challenges. What are some questions to ask my health care provider?  Do I need to meet with a diabetes educator?  Do I need to meet with a dietitian?  What number can I call if I have questions?  When are the best times to check my blood glucose? Where to find more information:  American Diabetes Association: diabetes.org/food-and-fitness/food  Academy of Nutrition and Dietetics: www.eatright.org/resources/health/diseases-and-conditions/diabetes  National Institute of Diabetes and Digestive and Kidney Diseases (NIH): www.niddk.nih.gov/health-information/diabetes/overview/diet-eating-physical-activity Summary  A healthy meal plan will help you control your blood glucose and maintain a healthy lifestyle.  Working with a diet and nutrition specialist (dietitian) can help you make a meal plan that is best for you.  Keep in mind that carbohydrates and alcohol have immediate effects on your blood glucose   levels. It is important to count carbohydrates and to use alcohol carefully. This information is not intended to replace advice given to you by your health care provider. Make sure you discuss any questions you have with your health care provider. Document Released: 07/31/2005 Document Revised: 12/08/2016 Document Reviewed: 12/08/2016 Elsevier Interactive Patient Education  2018 Elsevier Inc.  

## 2018-06-29 NOTE — Telephone Encounter (Signed)
Copied from CRM [phone]. Topic: General - Other >> Jun 29, 2018  3:32 PM Percival SpanishKennedy, Cheryl W wrote:   Pharmacy told pt his insurance company will not cover the below meds and will need to have something else issued    Dulaglutide (TRULICITY) 0.75 MG/0.5ML SOPN     metFORMIN (GLUMETZA) 1000 MG (MOD) 24 hr tablet   Pharmacy [facility]

## 2018-06-29 NOTE — Progress Notes (Signed)
8/13/20191:54 PM  Paul LiasJames Roberson 10/21/1971, 47 y.o. male [id number]  Chief Complaint  Patient presents with  . Pain    pain in the right hip that radiates down the leg. Has been a truck driver for 12 years. Taking aleve for the pain. Pt is a diabetic, a1C was 10.4 taken at [facility] yesterday    HPI:   Patient is a 47 y.o. male with past medical history significant for HTN and DM2 who presents today for followup and right leg pain  Last visit 08/2017   Lab Results  Component Value Date   HGBA1C 8.5 08/21/2017   Pain of right buttock x 1 week Feels it when he sits down or drives  Pain goes down to his foot Pain actually started 2 weeks ago in low back but now only buttocks Aleve not helping Had similar problems on left leg  No weakness. occ tightness and tingling No changes in bowel or bladder  Had a1c checked yesterday and it was 10.5, but continues to take it once a day. It causes diarrhea.  Currently being disqualified from driving  He has been working on cutting back on smoking, currently 2-3 cig day  Fall Risk  06/29/2018 08/21/2017 07/30/2017 05/28/2017  Falls in the past year? No No No No     Depression [facility] 2/9 06/29/2018 08/21/2017 07/30/2017  Decreased Interest 0 0 0  Down, Depressed, Hopeless 0 0 0  PHQ - 2 Score 0 0 0    Allergies  Allergen Reactions  . Penicillins Itching    Prior to Admission medications   Medication Sig Start Date End Date Taking? Authorizing Provider  amLODipine (NORVASC) 10 MG tablet Take 1 tablet (10 mg total) by mouth daily. 07/30/17   Ofilia Neaslark, Michael L, PA-C  metFORMIN (GLUCOPHAGE) 1000 MG tablet Take 1 tablet (1,000 mg total) by mouth 2 (two) times daily with a meal. 07/30/17   Ofilia Neaslark, Michael L, PA-C    Past Medical History:  Diagnosis Date  . Diabetes mellitus without complication (HCC)   . Hypertension     History reviewed. No pertinent surgical history.  Social History   Tobacco Use  . Smoking status: Current Some  Day Smoker  . Smokeless tobacco: Never Used  Substance Use Topics  . Alcohol use: Yes    Family History  Problem Relation Age of Onset  . Diabetes Mother   . Heart disease Mother   . Hypertension Mother   . Asthma Mother   . Diabetes Father   . Heart disease Father   . Hypertension Father   . Kidney disease Father   . Cancer Father     Review of Systems  Constitutional: Negative for chills and fever.  Respiratory: Negative for cough and shortness of breath.   Cardiovascular: Negative for chest pain, palpitations and leg swelling.  Gastrointestinal: Negative for abdominal pain, nausea and vomiting.     OBJECTIVE:  Blood pressure 138/90, pulse 100, temperature 98.4 F (36.9 C), temperature source Oral, height 5' 9.88" (1.775 m), weight 230 lb 12.8 oz (104.7 kg), SpO2 100 %. Body mass index is 33.23 kg/m.   Wt Readings from Last 3 Encounters:  06/29/18 230 lb 12.8 oz (104.7 kg)  08/21/17 235 lb 9.6 oz (106.9 kg)  07/30/17 239 lb 3.2 oz (108.5 kg)    Physical Exam  Constitutional: He is oriented to person, place, and time. He appears well-developed and well-nourished.  HENT:  Head: Normocephalic and atraumatic.  Mouth/Throat: Oropharynx is clear and  moist.  Eyes: Pupils are equal, round, and reactive to light. EOM are normal.  Neck: Neck supple.  Cardiovascular: Normal rate and regular rhythm. Exam reveals no gallop and no friction rub.  No murmur heard. Pulmonary/Chest: Effort normal and breath sounds normal. He has no wheezes. He has no rales.  Musculoskeletal:       Lumbar back: He exhibits normal range of motion, no tenderness and no bony tenderness.  + TTP over right piriformis Right hip normal   Neurological: He is alert and oriented to person, place, and time. He displays normal reflexes.  Neg SLR  Skin: Skin is warm and dry.  Psychiatric: He has a normal mood and affect.  Nursing note and vitals reviewed.   Results for orders placed or performed in  visit on 06/29/18 (from the past 24 hour(s))  POCT glycosylated hemoglobin (Hb A1C)     Status: Abnormal   Collection Time: 06/29/18  2:35 PM  Result Value Ref Range   Hemoglobin A1C 11.5 (A) 4.0 - 5.6 %   HbA1c POC (<> result, manual entry)     HbA1c, POC (prediabetic range)     HbA1c, POC (controlled diabetic range)       ASSESSMENT and PLAN  1. Type 2 diabetes mellitus without complication, without long-term current use of insulin (HCC) Uncontrolled. Changing metformin to XR for improved GI tolerance. Adding victoza. New med r/se/b reviewed. Discussed LFM.  - Ambulatory referral to Ophthalmology - POCT glycosylated hemoglobin (Hb A1C) - Comprehensive metabolic panel - TSH - Lipid panel - Microalbumin/Creatinine Ratio, Urine - amLODipine (NORVASC) 10 MG tablet; Take 1 tablet (10 mg total) by mouth daily.  2. Essential hypertension Controlled. Continue current regime.  - Comprehensive metabolic panel - Lipid panel  3. Screening for HIV (human immunodeficiency virus) - HIV antibody  4. Piriformis syndrome of right side Discussed supportive measures, new meds r/se/b and RTC precautions. Patient educational handout given.  Other orders - Td vaccine greater than or equal to 7yo preservative free IM - metFORMIN (GLUMETZA) 1000 MG (MOD) 24 hr tablet; Take 1 tablet (1,000 mg total) by mouth daily with breakfast. - Dulaglutide (TRULICITY) 0.75 MG/0.5ML SOPN; Inject 0.75 mg into the skin once a week. - ibuprofen (ADVIL,MOTRIN) 600 MG tablet; Take 1 tablet (600 mg total) by mouth every 8 (eight) hours as needed. - cyclobenzaprine (FLEXERIL) 10 MG tablet; Take 1 tablet (10 mg total) by mouth 3 (three) times daily as needed for muscle spasms.  Return in about 3 months (around 09/29/2018).    Myles LippsIrma M Santiago, MD [facility] [address] [place], [place] [address] Ph.  [phone] Fax [phone]

## 2018-06-30 LAB — MICROALBUMIN / CREATININE URINE RATIO
Creatinine, Urine: 216.2 mg/dL
Microalb/Creat Ratio: 45.1 mg/g creat — ABNORMAL HIGH (ref 0.0–30.0)
Microalbumin, Urine: 97.5 ug/mL

## 2018-06-30 LAB — COMPREHENSIVE METABOLIC PANEL
ALT: 23 IU/L (ref 0–44)
AST: 23 IU/L (ref 0–40)
Albumin/Globulin Ratio: 1.5 (ref 1.2–2.2)
Albumin: 4.6 g/dL (ref 3.5–5.5)
Alkaline Phosphatase: 71 IU/L (ref 39–117)
BUN/Creatinine Ratio: 13 (ref 9–20)
BUN: 14 mg/dL (ref 6–24)
Bilirubin Total: 0.5 mg/dL (ref 0.0–1.2)
CO2: 22 mmol/L (ref 20–29)
Calcium: 10 mg/dL (ref 8.7–10.2)
Chloride: 102 mmol/L (ref 96–106)
Creatinine, Ser: 1.11 mg/dL (ref 0.76–1.27)
GFR calc Af Amer: 92 mL/min/{1.73_m2} (ref >59)
GFR calc non Af Amer: 80 mL/min/{1.73_m2} (ref >59)
Globulin, Total: 3 g/dL (ref 1.5–4.5)
Glucose: 287 mg/dL — ABNORMAL HIGH (ref 65–99)
Potassium: 4.5 mmol/L (ref 3.5–5.2)
Sodium: 141 mmol/L (ref 134–144)
Total Protein: 7.6 g/dL (ref 6.0–8.5)

## 2018-06-30 LAB — LIPID PANEL
Chol/HDL Ratio: 3.5 ratio (ref 0.0–5.0)
Cholesterol, Total: 171 mg/dL (ref 100–199)
HDL: 49 mg/dL (ref >39)
LDL Calculated: 94 mg/dL (ref 0–99)
Triglycerides: 139 mg/dL (ref 0–149)
VLDL Cholesterol Cal: 28 mg/dL (ref 5–40)

## 2018-06-30 LAB — TSH: TSH: 2.28 u[IU]/mL (ref 0.450–4.500)

## 2018-06-30 LAB — HIV ANTIBODY (ROUTINE TESTING W REFLEX): HIV Screen 4th Generation wRfx: NONREACTIVE

## 2018-06-30 NOTE — Telephone Encounter (Signed)
Pt checking status on message below

## 2018-07-01 ENCOUNTER — Telehealth: Payer: Self-pay | Admitting: Family Medicine

## 2018-07-01 NOTE — Telephone Encounter (Signed)
Called patient in regards to his medication (Trulicity) was approved by [employer]. Pt didn't have dpr on file so did not leave voicemail.

## 2018-07-07 ENCOUNTER — Other Ambulatory Visit: Payer: Self-pay | Admitting: Family Medicine

## 2018-07-07 MED ORDER — GLIPIZIDE 10 MG PO TABS: 10.0000 mg | ORAL_TABLET | Freq: Every day | ORAL | 2 refills | Status: DC

## 2018-07-07 MED ORDER — METFORMIN HCL ER (OSM) 1000 MG PO TB24: 1000.0000 mg | ORAL_TABLET | Freq: Two times a day (BID) | ORAL | 2 refills | Status: DC

## 2018-07-07 NOTE — Addendum Note (Signed)
Addended by: Myles LippsSANTIAGO, Kay Shippy M on: 07/07/2018 12:35 AM   Modules accepted: Orders

## 2018-07-07 NOTE — Telephone Encounter (Signed)
LOV 06/29/18 Dr. [facility]re[facility] PolSantiago [facility] [place]

## 2018-07-07 NOTE — Telephone Encounter (Signed)
Sorry for delay, had to investigate.  So insurance does cover extended release metformin, sending different version  Regarding trulicity...it is covered but he needs to try other medications first.   So I sent a prescription for glipizide. He needs to be aware of risk of low sugars so he must take his medications with meals.   Thank you I [place]

## 2018-07-07 NOTE — Telephone Encounter (Signed)
Paperwork came in - Trulicity was approved -   Paperwork in [employer]'s bo.

## 2018-07-12 NOTE — Telephone Encounter (Signed)
Patient was advised. Voiced understanding 

## 2018-10-01 ENCOUNTER — Ambulatory Visit: Payer: 59 | Admitting: Family Medicine

## 2018-10-03 ENCOUNTER — Other Ambulatory Visit: Payer: Self-pay | Admitting: Family Medicine

## 2018-10-04 NOTE — Telephone Encounter (Signed)
Courtesy refill until appointment 12/17/18.

## 2018-10-04 NOTE — Telephone Encounter (Signed)
Patient called and advised he will need to schedule an appointment that he missed one. He says he doesn't have insurance right now until the first of the year, he asks for the end of January on a Friday, because he works out of town and will be home then. Appointment scheduled for Friday, 12/17/18 at 1100. I advised a refill will be sent to his pharmacy.

## 2018-10-18 ENCOUNTER — Ambulatory Visit: Payer: Self-pay | Admitting: Emergency Medicine

## 2018-12-17 ENCOUNTER — Ambulatory Visit: Payer: BLUE CROSS/BLUE SHIELD | Admitting: Emergency Medicine

## 2018-12-17 ENCOUNTER — Encounter: Payer: Self-pay | Admitting: Emergency Medicine

## 2018-12-17 VITALS — BP 138/96 | HR 100 | Temp 98.3°F | Resp 16 | Ht 70.0 in | Wt 239.4 lb

## 2018-12-17 DIAGNOSIS — I10 Essential (primary) hypertension: Secondary | ICD-10-CM

## 2018-12-17 DIAGNOSIS — E119 Type 2 diabetes mellitus without complications: Secondary | ICD-10-CM

## 2018-12-17 LAB — POCT GLYCOSYLATED HEMOGLOBIN (HGB A1C): Hemoglobin A1C: 9.9 % — AB (ref 4.0–5.6)

## 2018-12-17 LAB — GLUCOSE, POCT (MANUAL RESULT ENTRY): POC GLUCOSE: 265 mg/dL — AB (ref 70–99)

## 2018-12-17 MED ORDER — ROSUVASTATIN CALCIUM 5 MG PO TABS: 5.0000 mg | ORAL_TABLET | Freq: Every day | ORAL | 3 refills | Status: DC

## 2018-12-17 MED ORDER — GLIPIZIDE 10 MG PO TABS: 10.0000 mg | ORAL_TABLET | Freq: Every day | ORAL | 2 refills | Status: DC

## 2018-12-17 MED ORDER — METFORMIN HCL 1000 MG PO TABS: 1000.0000 mg | ORAL_TABLET | Freq: Two times a day (BID) | ORAL | 3 refills | Status: DC

## 2018-12-17 MED ORDER — LISINOPRIL 10 MG PO TABS: 10.0000 mg | ORAL_TABLET | Freq: Every day | ORAL | 3 refills | Status: DC

## 2018-12-17 MED ORDER — AMLODIPINE BESYLATE 10 MG PO TABS: 10.0000 mg | ORAL_TABLET | Freq: Every day | ORAL | 2 refills | Status: DC

## 2018-12-17 NOTE — Assessment & Plan Note (Signed)
Uncontrolled diabetes with hemoglobin A1c at 9.9 today.  Patient has been noncompliant with medications, diet, and exercise.  Will restart metformin 1000 mg twice a day.  Continue glipizide 10 mg daily.  Diet and exercise.  Follow-up in 3 months.

## 2018-12-17 NOTE — Patient Instructions (Addendum)
   If you have lab work done today you will be contacted with your lab results within the next 2 weeks.  If you have not heard from us then please contact us. The fastest way to get your results is to register for My Chart.   IF you received an x-ray today, you will receive an invoice from [facility]. Please contact [facility] at [phone] with questions or concerns regarding your invoice.   IF you received labwork today, you will receive an invoice from [facility]. Please contact [facility] at [phone] with questions or concerns regarding your invoice.   Our billing staff will not be able to assist you with questions regarding bills from these companies.  You will be contacted with the lab results as soon as they are available. The fastest way to get your results is to activate your My Chart account. Instructions are located on the last page of this paperwork. If you have not heard from us regarding the results in 2 weeks, please contact this office.     Diabetes Mellitus and Nutrition, Adult When you have diabetes (diabetes mellitus), it is very important to have healthy eating habits because your blood sugar (glucose) levels are greatly affected by what you eat and drink. Eating healthy foods in the appropriate amounts, at about the same times every day, can help you:  Control your blood glucose.  Lower your risk of heart disease.  Improve your blood pressure.  Reach or maintain a healthy weight. Every person with diabetes is different, and each person has different needs for a meal plan. Your health care provider may recommend that you work with a diet and nutrition specialist (dietitian) to make a meal plan that is best for you. Your meal plan may vary depending on factors such as:  The calories you need.  The medicines you take.  Your weight.  Your blood glucose, blood pressure, and cholesterol levels.  Your activity level.  Other health conditions  you have, such as heart or kidney disease. How do carbohydrates affect me? Carbohydrates, also called carbs, affect your blood glucose level more than any other type of food. Eating carbs naturally raises the amount of glucose in your blood. Carb counting is a method for keeping track of how many carbs you eat. Counting carbs is important to keep your blood glucose at a healthy level, especially if you use insulin or take certain oral diabetes medicines. It is important to know how many carbs you can safely have in each meal. This is different for every person. Your dietitian can help you calculate how many carbs you should have at each meal and for each snack. Foods that contain carbs include:  Bread, cereal, rice, pasta, and crackers.  Potatoes and corn.  Peas, beans, and lentils.  Milk and yogurt.  Fruit and juice.  Desserts, such as cakes, cookies, ice cream, and candy. How does alcohol affect me? Alcohol can cause a sudden decrease in blood glucose (hypoglycemia), especially if you use insulin or take certain oral diabetes medicines. Hypoglycemia can be a life-threatening condition. Symptoms of hypoglycemia (sleepiness, dizziness, and confusion) are similar to symptoms of having too much alcohol. If your health care provider says that alcohol is safe for you, follow these guidelines:  Limit alcohol intake to no more than 1 drink per day for nonpregnant women and 2 drinks per day for men. One drink equals 12 oz of beer, 5 oz of wine, or 1 oz of hard liquor.    Do not drink on an empty stomach.  Keep yourself hydrated with water, diet soda, or unsweetened iced tea.  Keep in mind that regular soda, juice, and other mixers may contain a lot of sugar and must be counted as carbs. What are tips for following this plan?  Reading food labels  Start by checking the serving size on the "Nutrition Facts" label of packaged foods and drinks. The amount of calories, carbs, fats, and other  nutrients listed on the label is based on one serving of the item. Many items contain more than one serving per package.  Check the total grams (g) of carbs in one serving. You can calculate the number of servings of carbs in one serving by dividing the total carbs by 15. For example, if a food has 30 g of total carbs, it would be equal to 2 servings of carbs.  Check the number of grams (g) of saturated and trans fats in one serving. Choose foods that have low or no amount of these fats.  Check the number of milligrams (mg) of salt (sodium) in one serving. Most people should limit total sodium intake to less than 2,300 mg per day.  Always check the nutrition information of foods labeled as "low-fat" or "nonfat". These foods may be higher in added sugar or refined carbs and should be avoided.  Talk to your dietitian to identify your daily goals for nutrients listed on the label. Shopping  Avoid buying canned, premade, or processed foods. These foods tend to be high in fat, sodium, and added sugar.  Shop around the outside edge of the grocery store. This includes fresh fruits and vegetables, bulk grains, fresh meats, and fresh dairy. Cooking  Use low-heat cooking methods, such as baking, instead of high-heat cooking methods like deep frying.  Cook using healthy oils, such as olive, canola, or sunflower oil.  Avoid cooking with butter, cream, or high-fat meats. Meal planning  Eat meals and snacks regularly, preferably at the same times every day. Avoid going long periods of time without eating.  Eat foods high in fiber, such as fresh fruits, vegetables, beans, and whole grains. Talk to your dietitian about how many servings of carbs you can eat at each meal.  Eat 4-6 ounces (oz) of lean protein each day, such as lean meat, chicken, fish, eggs, or tofu. One oz of lean protein is equal to: ? 1 oz of meat, chicken, or fish. ? 1 egg. ?  cup of tofu.  Eat some foods each day that contain  healthy fats, such as avocado, nuts, seeds, and fish. Lifestyle  Check your blood glucose regularly.  Exercise regularly as told by your health care provider. This may include: ? 150 minutes of moderate-intensity or vigorous-intensity exercise each week. This could be brisk walking, biking, or water aerobics. ? Stretching and doing strength exercises, such as yoga or weightlifting, at least 2 times a week.  Take medicines as told by your health care provider.  Do not use any products that contain nicotine or tobacco, such as cigarettes and e-cigarettes. If you need help quitting, ask your health care provider.  Work with a counselor or diabetes educator to identify strategies to manage stress and any emotional and social challenges. Questions to ask a health care provider  Do I need to meet with a diabetes educator?  Do I need to meet with a dietitian?  What number can I call if I have questions?  When are the best times to   check my blood glucose? Where to find more information:  American Diabetes Association: diabetes.org  Academy of Nutrition and Dietetics: www.eatright.org  National Institute of Diabetes and Digestive and Kidney Diseases (NIH): www.niddk.nih.gov Summary  A healthy meal plan will help you control your blood glucose and maintain a healthy lifestyle.  Working with a diet and nutrition specialist (dietitian) can help you make a meal plan that is best for you.  Keep in mind that carbohydrates (carbs) and alcohol have immediate effects on your blood glucose levels. It is important to count carbs and to use alcohol carefully. This information is not intended to replace advice given to you by your health care provider. Make sure you discuss any questions you have with your health care provider. Document Released: 07/31/2005 Document Revised: 06/03/2017 Document Reviewed: 12/08/2016 Elsevier Interactive Patient Education  2019 Elsevier Inc.  

## 2018-12-17 NOTE — Assessment & Plan Note (Signed)
Uncontrolled hypertension.  Restart Norvasc 10 mg daily.  Follow-up in 3 months.

## 2018-12-17 NOTE — Progress Notes (Signed)
Paul Roberson 48 y.o.   Chief Complaint  Patient presents with  . Diabetes    have not been taking blood sugar this week, need a refill on glipizide  . Hypertension    need a refill on amlodipine   Lab Results  Component Value Date   HGBA1C 9.9 (A) 12/17/2018   BP Readings from Last 3 Encounters:  12/17/18 (!) 156/96  06/29/18 138/90  08/21/17 130/70    HISTORY OF PRESENT ILLNESS: This is a 48 y.o. male here for follow-up of diabetes and hypertension.  Needs medication refills.  Asymptomatic.  Off medication for the last 2 weeks.  Metformin.  Only taking glipizide 10 mg once a day.  HPI   Prior to Admission medications   Medication Sig Start Date End Date Taking? Authorizing Provider  amLODipine (NORVASC) 10 MG tablet Take 1 tablet (10 mg total) by mouth daily. 12/17/18  Yes Wynn Kernes, Eilleen Kempf, MD  glipiZIDE (GLUCOTROL) 10 MG tablet Take 1 tablet (10 mg total) by mouth daily before breakfast. Keep 12/17/18 appointment for more refills. 12/17/18  Yes Georgina Quint, MD    Allergies  Allergen Reactions  . Penicillins Itching    Patient Active Problem List   Diagnosis Date Noted  . Essential hypertension 05/28/2017  . Type 2 diabetes mellitus without complication, without long-term current use of insulin (HCC) 05/28/2017    Past Medical History:  Diagnosis Date  . Diabetes mellitus without complication (HCC)   . Hypertension     No past surgical history on file.  Social History   Socioeconomic History  . Marital status: Married    Spouse name: Not on file  . Number of children: Not on file  . Years of education: Not on file  . Highest education level: Not on file  Occupational History  . Not on file  Social Needs  . Financial resource strain: Not on file  . Food insecurity:    Worry: Not on file    Inability: Not on file  . Transportation needs:    Medical: Not on file    Non-medical: Not on file  Tobacco Use  . Smoking status: Current Some Day  Smoker  . Smokeless tobacco: Never Used  Substance and Sexual Activity  . Alcohol use: Yes  . Drug use: No  . Sexual activity: Yes  Lifestyle  . Physical activity:    Days per week: Not on file    Minutes per session: Not on file  . Stress: Not on file  Relationships  . Social connections:    Talks on phone: Not on file    Gets together: Not on file    Attends religious service: Not on file    Active member of club or organization: Not on file    Attends meetings of clubs or organizations: Not on file    Relationship status: Not on file  . Intimate partner violence:    Fear of current or ex partner: Not on file    Emotionally abused: Not on file    Physically abused: Not on file    Forced sexual activity: Not on file  Other Topics Concern  . Not on file  Social History Narrative  . Not on file    Family History  Problem Relation Age of Onset  . Diabetes Mother   . Heart disease Mother   . Hypertension Mother   . Asthma Mother   . Diabetes Father   . Heart disease Father   .  Hypertension Father   . Kidney disease Father   . Cancer Father      Review of Systems  Constitutional: Negative.  Negative for chills and fever.  HENT: Negative.  Negative for congestion, nosebleeds and sore throat.   Eyes: Negative.  Negative for blurred vision and double vision.  Respiratory: Negative.  Negative for cough and shortness of breath.   Cardiovascular: Negative.  Negative for chest pain and palpitations.  Gastrointestinal: Negative.  Negative for abdominal pain, diarrhea, nausea and vomiting.  Genitourinary: Negative.  Negative for dysuria, frequency and hematuria.  Musculoskeletal: Negative.  Negative for back pain, myalgias and neck pain.  Skin: Negative.  Negative for rash.  Neurological: Negative.  Negative for dizziness and headaches.  Endo/Heme/Allergies: Negative.     Vitals:   12/17/18 1111  BP: (!) 156/96  Pulse: 100  Resp: 16  Temp: 98.3 F (36.8 C)  SpO2:  97%    Physical Exam Vitals signs reviewed.  Constitutional:      Appearance: Normal appearance.  HENT:     Head: Normocephalic and atraumatic.     Nose: Nose normal.     Mouth/Throat:     Mouth: Mucous membranes are moist.     Pharynx: Oropharynx is clear.  Eyes:     Extraocular Movements: Extraocular movements intact.     Conjunctiva/sclera: Conjunctivae normal.     Pupils: Pupils are equal, round, and reactive to light.  Neck:     Musculoskeletal: Normal range of motion and neck supple.  Cardiovascular:     Rate and Rhythm: Normal rate and regular rhythm.     Heart sounds: Normal heart sounds.  Pulmonary:     Effort: Pulmonary effort is normal.     Breath sounds: Normal breath sounds.  Abdominal:     General: There is no distension.     Palpations: Abdomen is soft.     Tenderness: There is no abdominal tenderness.  Musculoskeletal: Normal range of motion.  Skin:    General: Skin is warm and dry.  Neurological:     General: No focal deficit present.     Mental Status: He is alert and oriented to person, place, and time.  Psychiatric:        Mood and Affect: Mood normal.        Behavior: Behavior normal.    Results for orders placed or performed in visit on 12/17/18 (from the past 24 hour(s))  POCT glucose (manual entry)     Status: Abnormal   Collection Time: 12/17/18 11:35 AM  Result Value Ref Range   POC Glucose 265 (A) 70 - 99 mg/dl  POCT glycosylated hemoglobin (Hb A1C)     Status: Abnormal   Collection Time: 12/17/18 11:40 AM  Result Value Ref Range   Hemoglobin A1C 9.9 (A) 4.0 - 5.6 %   HbA1c POC (<> result, manual entry)     HbA1c, POC (prediabetic range)     HbA1c, POC (controlled diabetic range)      A total of 40 minutes was spent in the room with the patient, greater than 50% of which was in counseling/coordination of care regarding chronic medical problems in particular diabetes and hypertension, treatment and management, medications and side  effects, nutrition and exercise, prognosis and need for follow-up.   ASSESSMENT & PLAN: Type 2 diabetes mellitus without complication, without long-term current use of insulin (HCC) Uncontrolled diabetes with hemoglobin A1c at 9.9 today.  Patient has been noncompliant with medications, diet, and exercise.  Will  restart metformin 1000 mg twice a day.  Continue glipizide 10 mg daily.  Diet and exercise.  Follow-up in 3 months.  Essential hypertension Uncontrolled hypertension.  Restart Norvasc 10 mg daily.  Follow-up in 3 months.  [name] was seen today for diabetes and hypertension.  Diagnoses and all orders for this visit:  Type 2 diabetes mellitus without complication, without long-term current use of insulin (HCC) -     POCT glycosylated hemoglobin (Hb A1C) -     POCT glucose (manual entry) -     Cancel: Microalbumin/Creatinine Ratio, Urine -     HM Diabetes Foot Exam -     amLODipine (NORVASC) 10 MG tablet; Take 1 tablet (10 mg total) by mouth daily. -     glipiZIDE (GLUCOTROL) 10 MG tablet; Take 1 tablet (10 mg total) by mouth daily before breakfast. Keep 12/17/18 appointment for more refills. -     metFORMIN (GLUCOPHAGE) 1000 MG tablet; Take 1 tablet (1,000 mg total) by mouth 2 (two) times daily with a meal. -     Comprehensive metabolic panel -     Lipid panel -     rosuvastatin (CRESTOR) 5 MG tablet; Take 1 tablet (5 mg total) by mouth daily.  Essential hypertension -     lisinopril (PRINIVIL,ZESTRIL) 10 MG tablet; Take 1 tablet (10 mg total) by mouth daily. -     Comprehensive metabolic panel -     Lipid panel    Patient Instructions       If you have lab work done today you will be contacted with your lab results within the next 2 weeks.  If you have not heard from us then please contact us. The fastest way to get your results is to register for My Chart.   IF you received an x-ray today, you will receive an invoice from [facility]. Please contact [facility] at [phone] with questions or concerns regarding your invoice.   IF you received labwork today, you will receive an invoice from [facility]. Please contact [facility] at [phone] with questions or concerns regarding your invoice.   Our billing staff will not be able to assist you with questions regarding bills from these companies.  You will be contacted with the lab results as soon as they are available. The fastest way to get your results is to activate your My Chart account. Instructions are located on the last page of this paperwork. If you have not heard from us regarding the results in 2 weeks, please contact this office.      Diabetes Mellitus and Nutrition, Adult When you have diabetes (diabetes mellitus), it is very important to have healthy eating habits because your blood sugar (glucose) levels are greatly affected by what you eat and drink. Eating healthy foods in the appropriate amounts, at about the same times every day, can help you:  Control your blood glucose.  Lower your risk of heart disease.  Improve your blood pressure.  Reach or maintain a healthy weight. Every person with diabetes is different, and each person has different needs for a meal plan. Your health care provider may recommend that you work with a diet and nutrition specialist (dietitian) to make a meal plan that is best for you. Your meal plan may vary depending on factors such as:  The calories you need.  The medicines you take.  Your weight.  Your blood glucose, blood pressure, and cholesterol levels.  Your activity level.  Other health conditions you have,  such as heart or kidney disease. How do carbohydrates affect me? Carbohydrates, also called carbs, affect your blood glucose level more than any other type of food. Eating carbs naturally raises the amount of glucose in your blood. Carb counting is a method for keeping track of how many carbs you eat. Counting carbs is important  to keep your blood glucose at a healthy level, especially if you use insulin or take certain oral diabetes medicines. It is important to know how many carbs you can safely have in each meal. This is different for every person. Your dietitian can help you calculate how many carbs you should have at each meal and for each snack. Foods that contain carbs include:  Bread, cereal, rice, pasta, and crackers.  Potatoes and corn.  Peas, beans, and lentils.  Milk and yogurt.  Fruit and juice.  Desserts, such as cakes, cookies, ice cream, and candy. How does alcohol affect me? Alcohol can cause a sudden decrease in blood glucose (hypoglycemia), especially if you use insulin or take certain oral diabetes medicines. Hypoglycemia can be a life-threatening condition. Symptoms of hypoglycemia (sleepiness, dizziness, and confusion) are similar to symptoms of having too much alcohol. If your health care provider says that alcohol is safe for you, follow these guidelines:  Limit alcohol intake to no more than 1 drink per day for nonpregnant women and 2 drinks per day for men. One drink equals 12 oz of beer, 5 oz of wine, or 1 oz of hard liquor.  Do not drink on an empty stomach.  Keep yourself hydrated with water, diet soda, or unsweetened iced tea.  Keep in mind that regular soda, juice, and other mixers may contain a lot of sugar and must be counted as carbs. What are tips for following this plan?  Reading food labels  Start by checking the serving size on the "Nutrition Facts" label of packaged foods and drinks. The amount of calories, carbs, fats, and other nutrients listed on the label is based on one serving of the item. Many items contain more than one serving per package.  Check the total grams (g) of carbs in one serving. You can calculate the number of servings of carbs in one serving by dividing the total carbs by 15. For example, if a food has 30 g of total carbs, it would be equal to 2  servings of carbs.  Check the number of grams (g) of saturated and trans fats in one serving. Choose foods that have low or no amount of these fats.  Check the number of milligrams (mg) of salt (sodium) in one serving. Most people should limit total sodium intake to less than 2,300 mg per day.  Always check the nutrition information of foods labeled as "low-fat" or "nonfat". These foods may be higher in added sugar or refined carbs and should be avoided.  Talk to your dietitian to identify your daily goals for nutrients listed on the label. Shopping  Avoid buying canned, premade, or processed foods. These foods tend to be high in fat, sodium, and added sugar.  Shop around the outside edge of the grocery store. This includes fresh fruits and vegetables, bulk grains, fresh meats, and fresh dairy. Cooking  Use low-heat cooking methods, such as baking, instead of high-heat cooking methods like deep frying.  Cook using healthy oils, such as olive, canola, or sunflower oil.  Avoid cooking with butter, cream, or high-fat meats. Meal planning  Eat meals and snacks regularly, preferably at the  same times every day. Avoid going long periods of time without eating.  Eat foods high in fiber, such as fresh fruits, vegetables, beans, and whole grains. Talk to your dietitian about how many servings of carbs you can eat at each meal.  Eat 4-6 ounces (oz) of lean protein each day, such as lean meat, chicken, fish, eggs, or tofu. One oz of lean protein is equal to: ? 1 oz of meat, chicken, or fish. ? 1 egg. ?  cup of tofu.  Eat some foods each day that contain healthy fats, such as avocado, nuts, seeds, and fish. Lifestyle  Check your blood glucose regularly.  Exercise regularly as told by your health care provider. This may include: ? 150 minutes of moderate-intensity or vigorous-intensity exercise each week. This could be brisk walking, biking, or water aerobics. ? Stretching and doing  strength exercises, such as yoga or weightlifting, at least 2 times a week.  Take medicines as told by your health care provider.  Do not use any products that contain nicotine or tobacco, such as cigarettes and e-cigarettes. If you need help quitting, ask your health care provider.  Work with a Veterinary surgeoncounselor or diabetes educator to identify strategies to manage stress and any emotional and social challenges. Questions to ask a health care provider  Do I need to meet with a diabetes educator?  Do I need to meet with a dietitian?  What number can I call if I have questions?  When are the best times to check my blood glucose? Where to find more information:  American Diabetes Association: diabetes.org  Academy of Nutrition and Dietetics: www.eatright.[employer]  National Institute of Diabetes and Digestive and Kidney Diseases (NIH): Car[employer]lippers.tnwww.niddk.nih.gov Summary  A healthy meal plan will help you control your blood glucose and maintain a healthy lifestyle.  Working with a diet and nutrition specialist (dietitian) can help you make a meal plan that is best for you.  Keep in mind that carbohydrates (carbs) and alcohol have immediate effects on your blood glucose levels. It is important to count carbs and to use alcohol carefully. This information is not intended to replace advice given to you by your health care provider. Make sure you discuss any questions you have with your health care provider. Document Released: 07/31/2005 Document Revised: 06/03/2017 Document Reviewed: 12/08/2016 Elsevier Interactive Patient Education  2019 Elsevier Inc.      Edwina BarthMiguel Tayloranne Lekas, MD [facility]

## 2018-12-18 LAB — COMPREHENSIVE METABOLIC PANEL
ALT: 22 IU/L (ref 0–44)
AST: 22 IU/L (ref 0–40)
Albumin/Globulin Ratio: 1.7 (ref 1.2–2.2)
Albumin: 4.3 g/dL (ref 4.0–5.0)
Alkaline Phosphatase: 80 IU/L (ref 39–117)
BUN/Creatinine Ratio: 8 — ABNORMAL LOW (ref 9–20)
BUN: 9 mg/dL (ref 6–24)
Bilirubin Total: 0.5 mg/dL (ref 0.0–1.2)
CO2: 20 mmol/L (ref 20–29)
Calcium: 9.5 mg/dL (ref 8.7–10.2)
Chloride: 103 mmol/L (ref 96–106)
Creatinine, Ser: 1.2 mg/dL (ref 0.76–1.27)
GFR calc Af Amer: 84 mL/min/{1.73_m2} (ref >59)
GFR calc non Af Amer: 73 mL/min/{1.73_m2} (ref >59)
Globulin, Total: 2.5 g/dL (ref 1.5–4.5)
Glucose: 253 mg/dL — ABNORMAL HIGH (ref 65–99)
Potassium: 4.2 mmol/L (ref 3.5–5.2)
Sodium: 139 mmol/L (ref 134–144)
Total Protein: 6.8 g/dL (ref 6.0–8.5)

## 2018-12-18 LAB — LIPID PANEL
Chol/HDL Ratio: 4.1 ratio (ref 0.0–5.0)
Cholesterol, Total: 175 mg/dL (ref 100–199)
HDL: 43 mg/dL (ref >39)
LDL Calculated: 92 mg/dL (ref 0–99)
Triglycerides: 199 mg/dL — ABNORMAL HIGH (ref 0–149)
VLDL Cholesterol Cal: 40 mg/dL (ref 5–40)

## 2018-12-20 ENCOUNTER — Encounter: Payer: Self-pay | Admitting: *Deleted

## 2019-01-11 ENCOUNTER — Telehealth: Payer: Self-pay | Admitting: Emergency Medicine

## 2019-01-11 NOTE — Telephone Encounter (Signed)
Called and spoke with pt regarding his appt with Dr. Alvy Bimler on 5/1. Due to Dr. Alvy Bimler being out of the office, I was able to get him rescheudled for 5/11. I advised of time and late policy. Pt acknowledged.

## 2019-02-09 ENCOUNTER — Telehealth: Payer: Self-pay | Admitting: Emergency Medicine

## 2019-02-09 NOTE — Telephone Encounter (Unsigned)
Copied from CRM #[id number]. Topic: General - Other °>> Feb 09, 2019 12:15 PM Williams, Olivia A wrote: °Reason for CRM: Pt left vm requesting refill on amLODipine (NORVASC) 10 MG tablet [[id number]]. ° °[id number] - [place], [place] - [address] °[address] [place] [address] °Phone: [phone] Fax: [phone] °Open 24 hours °

## 2019-02-09 NOTE — Telephone Encounter (Signed)
Copied from CRM [id number]. Topic: General - Other >> Feb 09, 2019 12:15 PM Gwenlyn Fudge A wrote: Reason for CRM: Pt left vm requesting refill on amLODipine (NORVASC) 10 MG tablet [[id number]].  [id number] - [place], [place] - [address] AT [facility] [address] [place] [address] Phone: [phone] Fax: [phone] Open 24 hours

## 2019-02-12 NOTE — Telephone Encounter (Signed)
Spoke with pt and he states he has refills, I verbalized understanding.

## 2019-03-18 ENCOUNTER — Ambulatory Visit: Payer: BLUE CROSS/BLUE SHIELD | Admitting: Emergency Medicine

## 2019-03-28 ENCOUNTER — Telehealth (INDEPENDENT_AMBULATORY_CARE_PROVIDER_SITE_OTHER): Payer: BLUE CROSS/BLUE SHIELD | Admitting: Emergency Medicine

## 2019-03-28 ENCOUNTER — Other Ambulatory Visit: Payer: Self-pay

## 2019-03-28 ENCOUNTER — Encounter: Payer: Self-pay | Admitting: Emergency Medicine

## 2019-03-28 DIAGNOSIS — E1159 Type 2 diabetes mellitus with other circulatory complications: Secondary | ICD-10-CM

## 2019-03-28 DIAGNOSIS — E1165 Type 2 diabetes mellitus with hyperglycemia: Secondary | ICD-10-CM | POA: Diagnosis not present

## 2019-03-28 DIAGNOSIS — I10 Essential (primary) hypertension: Secondary | ICD-10-CM

## 2019-03-28 DIAGNOSIS — I152 Hypertension secondary to endocrine disorders: Secondary | ICD-10-CM

## 2019-03-28 MED ORDER — GLIPIZIDE 5 MG PO TABS: 5.0000 mg | ORAL_TABLET | Freq: Two times a day (BID) | ORAL | 3 refills | Status: DC

## 2019-03-28 MED ORDER — LISINOPRIL 10 MG PO TABS: 10.0000 mg | ORAL_TABLET | Freq: Every day | ORAL | 3 refills | Status: DC

## 2019-03-28 NOTE — Progress Notes (Signed)
F/u on DM2 and HTN. Pt states that he do not need any med refills at this time. Pt states that he is not taking the Glipizide and would like to discuss the reason with his doctor.

## 2019-03-28 NOTE — Progress Notes (Signed)
Lab Results  Component Value Date   HGBA1C 9.9 (A) 12/17/2018   BP Readings from Last 3 Encounters:  12/17/18 (!) 138/96  06/29/18 138/90  08/21/17 130/70   Lab Results  Component Value Date   CHOL 175 12/17/2018   HDL 43 12/17/2018   LDLCALC 92 12/17/2018   TRIG 199 (H) 12/17/2018   CHOLHDL 4.1 12/17/2018   The 10-year ASCVD risk score Denman George DC Jr., et al., 2013) is: 23.8%   Values used to calculate the score:     Age: 48 years     Sex: Male     Is Non-Hispanic African American: Yes     Diabetic: Yes     Tobacco smoker: Yes     Systolic Blood Pressure: 138 mmHg     Is BP treated: Yes     HDL Cholesterol: 43 mg/dL     Total Cholesterol: 175 mg/dL Wt Readings from Last 3 Encounters:  12/17/18 239 lb 6.4 oz (108.6 kg)  06/29/18 230 lb 12.8 oz (104.7 kg)  08/21/17 235 lb 9.6 oz (106.9 kg)      Telemedicine Encounter- SOAP NOTE Established Patient  This telephone encounter was conducted with the patient's (or proxy's) verbal consent via audio telecommunications: yes/no: Yes Patient was instructed to have this encounter in a suitably private space; and to only have persons present to whom they give permission to participate. In addition, patient identity was confirmed by use of name plus two identifiers (DOB and address).  I discussed the limitations, risks, security and privacy concerns of performing an evaluation and management service by telephone and the availability of in person appointments. I also discussed with the patient that there may be a patient responsible charge related to this service. The patient expressed understanding and agreed to proceed.  I spent a total of TIME; 0 MIN TO 60 MIN: 15 minutes talking with the patient or their proxy.  No chief complaint on file. Diabetes and hypertension follow-up  Subjective   Paul Roberson is a 48 y.o. male established patient. Telephone visit today for diabetes and hypertension follow-up.  Taking amlodipine 10 mg a day  but not taking lisinopril 10 mg's prescribed.  Blood pressures at home systolics between 130 and 140 and diastolics between 80 and 90.  Blood glucose at home between 170 -200.  Taking metformin 1000 mg twice a day.  Not taking glipizide 10 mg in the morning and due to constipation.  Still taking rosuvastatin once a day. No other complaints or medical concerns today.  HPI   Patient Active Problem List   Diagnosis Date Noted  . Essential hypertension 05/28/2017  . Type 2 diabetes mellitus without complication, without long-term current use of insulin (HCC) 05/28/2017    Past Medical History:  Diagnosis Date  . Diabetes mellitus without complication (HCC)   . Hypertension     Current Outpatient Medications  Medication Sig Dispense Refill  . amLODipine (NORVASC) 10 MG tablet Take 1 tablet (10 mg total) by mouth daily. 90 tablet 2  . metFORMIN (GLUCOPHAGE) 1000 MG tablet Take 1 tablet (1,000 mg total) by mouth 2 (two) times daily with a meal. 180 tablet 3  . rosuvastatin (CRESTOR) 5 MG tablet Take 1 tablet (5 mg total) by mouth daily. 90 tablet 3  . glipiZIDE (GLUCOTROL) 10 MG tablet Take 1 tablet (10 mg total) by mouth daily before breakfast. Keep 12/17/18 appointment for more refills. (Patient not taking: Reported on 03/28/2019) 90 tablet 2  . lisinopril (PRINIVIL,ZESTRIL) 10  MG tablet Take 1 tablet (10 mg total) by mouth daily. (Patient not taking: Reported on 03/28/2019) 90 tablet 3   No current facility-administered medications for this visit.     Allergies  Allergen Reactions  . Penicillins Itching    Social History   Socioeconomic History  . Marital status: Married    Spouse name: Not on file  . Number of children: Not on file  . Years of education: Not on file  . Highest education level: Not on file  Occupational History  . Not on file  Social Needs  . Financial resource strain: Not on file  . Food insecurity:    Worry: Not on file    Inability: Not on file  .  Transportation needs:    Medical: Not on file    Non-medical: Not on file  Tobacco Use  . Smoking status: Current Some Day Smoker  . Smokeless tobacco: Never Used  Substance and Sexual Activity  . Alcohol use: Yes  . Drug use: No  . Sexual activity: Yes  Lifestyle  . Physical activity:    Days per week: Not on file    Minutes per session: Not on file  . Stress: Not on file  Relationships  . Social connections:    Talks on phone: Not on file    Gets together: Not on file    Attends religious service: Not on file    Active member of club or organization: Not on file    Attends meetings of clubs or organizations: Not on file    Relationship status: Not on file  . Intimate partner violence:    Fear of current or ex partner: Not on file    Emotionally abused: Not on file    Physically abused: Not on file    Forced sexual activity: Not on file  Other Topics Concern  . Not on file  Social History Narrative  . Not on file    Review of Systems  Constitutional: Negative.  Negative for chills and fever.  HENT: Negative.  Negative for congestion and sore throat.   Eyes: Negative.   Respiratory: Negative.  Negative for cough and shortness of breath.   Cardiovascular: Negative.  Negative for chest pain and palpitations.  Gastrointestinal: Negative.  Negative for abdominal pain, diarrhea, nausea and vomiting.  Genitourinary: Negative.  Negative for dysuria.  Musculoskeletal: Negative.  Negative for myalgias.  Skin: Negative.  Negative for rash.  Neurological: Negative for dizziness and headaches.  All other systems reviewed and are negative.   Objective   Vitals as reported by the patient: Blood pressure 130-140 over 80-90.  Blood glucose between 170 and 200.  Self-reported numbers.  Awake and oriented x3 in no apparent respiratory distress while talking to me on the phone. There were no vitals filed for this visit.  There are no diagnoses linked to this encounter.    Diagnoses and all orders for this visit:  Type 2 diabetes mellitus with hyperglycemia, without long-term current use of insulin (HCC) -     glipiZIDE (GLUCOTROL) 5 MG tablet; Take 1 tablet (5 mg total) by mouth 2 (two) times daily before a meal.  Essential hypertension -     lisinopril (ZESTRIL) 10 MG tablet; Take 1 tablet (10 mg total) by mouth daily.  Hypertension associated with type 2 diabetes mellitus (HCC)     Uncontrolled diabetes.  Patient has not been taking glipizide due to constipation problems.  Continue metformin twice a day.  Advised to  use Metamucil daily.  Will try glipizide 5 mg twice a day instead of 10 mg daily in the morning.  Diabetic diet and nutrition discussed with patient.  Associated cardiovascular risks discussed with patient.  Encouraged to stay physically active. Blood pressure fairly well controlled.  Continue amlodipine and lisinopril 10 mg each daily. Office visit in 3 months. Continue rosuvastatin.  I discussed the assessment and treatment plan with the patient. The patient was provided an opportunity to ask questions and all were answered. The patient agreed with the plan and demonstrated an understanding of the instructions.   The patient was advised to call back or seek an in-person evaluation if the symptoms worsen or if the condition fails to improve as anticipated.  I provided 15 minutes of non-face-to-face time during this encounter.  Georgina Quint, MD  Primary Care at [facility]

## 2019-06-19 ENCOUNTER — Other Ambulatory Visit: Payer: Self-pay | Admitting: Emergency Medicine

## 2019-06-19 DIAGNOSIS — E1165 Type 2 diabetes mellitus with hyperglycemia: Secondary | ICD-10-CM

## 2019-06-19 NOTE — Telephone Encounter (Signed)
Courtesy refill x 30 days- pt must make OV Requested Prescriptions  Pending Prescriptions Disp Refills  . glipiZIDE (GLUCOTROL) 5 MG tablet [Pharmacy Med Name: GLIPIZIDE 5 MG TABLET] 60 tablet 0    Sig: TAKE 1 TABLET (5 MG TOTAL) BY MOUTH 2 (TWO) TIMES DAILY BEFORE A MEAL.     Endocrinology:  Diabetes - Sulfonylureas Failed - 06/19/2019 12:11 AM      Failed - HBA1C is between 0 and 7.9 and within 180 days    Hemoglobin A1C  Date Value Ref Range Status  12/17/2018 9.9 (A) 4.0 - 5.6 % Final   Hgb A1c MFr Bld  Date Value Ref Range Status  05/28/2017 9.8 (H) 4.8 - 5.6 % Final    Comment:             Pre-diabetes: 5.7 - 6.4          Diabetes: >6.4          Glycemic control for adults with diabetes: <7.0          Failed - Valid encounter within last 6 months    Recent Outpatient Visits          6 months ago Type 2 diabetes mellitus without complication, without long-term current use of insulin [facility])   Primary Care at [facility], [place], [place]   11 months ago Type 2 diabetes mellitus without complication, without long-term current use of insulin [facility])   Primary Care at [facility], Lilia Argue, [place]   1 year ago Type 2 diabetes mellitus without complication, without long-term current use of insulin [facility])   Primary Care at [place], [place], [place]   1 year ago Type 2 diabetes mellitus without complication, without long-term current use of insulin [facility])   Primary Care at [place], PA-C   2 years ago Essential hypertension   Primary Care at [facility], [place], [place]

## 2019-07-03 ENCOUNTER — Other Ambulatory Visit: Payer: Self-pay | Admitting: Emergency Medicine

## 2019-07-03 DIAGNOSIS — E1165 Type 2 diabetes mellitus with hyperglycemia: Secondary | ICD-10-CM

## 2019-07-04 NOTE — Telephone Encounter (Signed)
Please advise pt may have had a televisit

## 2019-12-06 ENCOUNTER — Telehealth: Payer: Self-pay | Admitting: Emergency Medicine

## 2019-12-06 NOTE — Telephone Encounter (Signed)
Paul Roberson is looking to to see if we have received medical record request sent on 01.15.2021 call back number is [phone]

## 2019-12-06 NOTE — Telephone Encounter (Signed)
I called office and asked them to refax the request.

## 2020-01-01 ENCOUNTER — Other Ambulatory Visit: Payer: Self-pay | Admitting: Emergency Medicine

## 2020-01-01 DIAGNOSIS — E119 Type 2 diabetes mellitus without complications: Secondary | ICD-10-CM

## 2020-01-27 ENCOUNTER — Other Ambulatory Visit: Payer: Self-pay | Admitting: Emergency Medicine

## 2020-01-27 DIAGNOSIS — E119 Type 2 diabetes mellitus without complications: Secondary | ICD-10-CM

## 2020-01-27 NOTE — Telephone Encounter (Signed)
Requested medication (s) are due for refill today:   Yes  Requested medication (s) are on the active medication list:   Yes  Future visit scheduled:   No   Last ordered: 01/01/2020  #30 with no refills.   Clinic note:  Needs OV for further refills.   Courtesy refill given in Feb.     Requested Prescriptions  Pending Prescriptions Disp Refills   amLODipine (NORVASC) 10 MG tablet [Pharmacy Med Name: AMLODIPINE BESYLATE 10 MG TAB] 30 tablet 0    Sig: TAKE 1 TABLET BY MOUTH EVERY DAY      Cardiovascular:  Calcium Channel Blockers Failed - 01/27/2020  9:31 AM      Failed - Last BP in normal range    BP Readings from Last 1 Encounters:  12/17/18 (!) 138/96          Failed - Valid encounter within last 6 months    Recent Outpatient Visits           10 months ago Type 2 diabetes mellitus with hyperglycemia, without long-term current use of insulin [facility])   Primary Care at [place], [place], [place]   1 year ago Type 2 diabetes mellitus without complication, without long-term current use of insulin [facility])   Primary Care at [place], [place], [place]   1 year ago Type 2 diabetes mellitus without complication, without long-term current use of insulin [facility])   Primary Care at [facility], [place], MD   2 years ago Type 2 diabetes mellitus without complication, without long-term current use of insulin [facility])   Primary Care at [place], [place], [place]   2 years ago Type 2 diabetes mellitus without complication, without long-term current use of insulin [facility])   Primary Care at [facility], Marolyn Hammock, PA-C

## 2020-01-27 NOTE — Telephone Encounter (Signed)
Requested medication (s) are due for refill today:   Yes  Requested medication (s) are on the active medication list:   Yes  Future visit scheduled:   No   Last ordered: 01/01/2020  #60  0 refills.   Clinic note:    Needs OV for further refills.   Courtesy refill was given in Feb.     Requested Prescriptions  Pending Prescriptions Disp Refills   metFORMIN (GLUCOPHAGE) 1000 MG tablet [Pharmacy Med Name: METFORMIN HCL 1,000 MG TABLET] 60 tablet 0    Sig: TAKE 1 TABLET (1,000 MG TOTAL) BY MOUTH 2 (TWO) TIMES DAILY WITH A MEAL. NEED OFFICE VISIT      Endocrinology:  Diabetes - Biguanides Failed - 01/27/2020  9:30 AM      Failed - Cr in normal range and within 360 days    Creatinine, Ser  Date Value Ref Range Status  12/17/2018 1.20 0.76 - 1.27 mg/dL Final          Failed - HBA1C is between 0 and 7.9 and within 180 days    Hemoglobin A1C  Date Value Ref Range Status  12/17/2018 9.9 (A) 4.0 - 5.6 % Final   Hgb A1c MFr Bld  Date Value Ref Range Status  05/28/2017 9.8 (H) 4.8 - 5.6 % Final    Comment:             Pre-diabetes: 5.7 - 6.4          Diabetes: >6.4          Glycemic control for adults with diabetes: <7.0           Failed - eGFR in normal range and within 360 days    GFR calc Af Amer  Date Value Ref Range Status  12/17/2018 84 >59 mL/min/1.73 Final   GFR calc non Af Amer  Date Value Ref Range Status  12/17/2018 73 >59 mL/min/1.73 Final          Failed - Valid encounter within last 6 months    Recent Outpatient Visits           10 months ago Type 2 diabetes mellitus with hyperglycemia, without long-term current use of insulin [facility])   Primary Care at [place], [place], [place]   1 year ago Type 2 diabetes mellitus without complication, without long-term current use of insulin [facility])   Primary Care at [place], [place], MD   1 year ago Type 2 diabetes mellitus without complication, without long-term current use of insulin [facility])   Primary Care  at [facility], Lilia Argue, MD   2 years ago Type 2 diabetes mellitus without complication, without long-term current use of insulin [facility])   Primary Care at [place], [place], [place]   2 years ago Type 2 diabetes mellitus without complication, without long-term current use of insulin [facility])   Primary Care at [facility], Audrie Lia, PA-C

## 2020-01-27 NOTE — Telephone Encounter (Signed)
Called pt to schedule office visit, he is currently traveling. Says he will not be available to come in until after Easter.

## 2020-02-20 ENCOUNTER — Other Ambulatory Visit: Payer: Self-pay | Admitting: Emergency Medicine

## 2020-02-20 ENCOUNTER — Other Ambulatory Visit (HOSPITAL_COMMUNITY)
Admission: RE | Admit: 2020-02-20 | Discharge: 2020-02-20 | Disposition: A | Discharge status: Home / Self Care | Payer: BC Managed Care – PPO | Source: Ambulatory Visit | Attending: Registered Nurse | Admitting: Registered Nurse

## 2020-02-20 ENCOUNTER — Encounter: Payer: Self-pay | Admitting: Registered Nurse

## 2020-02-20 ENCOUNTER — Other Ambulatory Visit: Payer: Self-pay

## 2020-02-20 ENCOUNTER — Ambulatory Visit: Payer: BC Managed Care – PPO | Admitting: Registered Nurse

## 2020-02-20 VITALS — BP 162/95 | HR 96 | Temp 98.0°F | Resp 16 | Ht 70.0 in | Wt 228.0 lb

## 2020-02-20 DIAGNOSIS — Z13220 Encounter for screening for lipoid disorders: Secondary | ICD-10-CM | POA: Diagnosis not present

## 2020-02-20 DIAGNOSIS — Z13228 Encounter for screening for other metabolic disorders: Secondary | ICD-10-CM

## 2020-02-20 DIAGNOSIS — Z1329 Encounter for screening for other suspected endocrine disorder: Secondary | ICD-10-CM | POA: Diagnosis not present

## 2020-02-20 DIAGNOSIS — E119 Type 2 diabetes mellitus without complications: Secondary | ICD-10-CM

## 2020-02-20 DIAGNOSIS — Z113 Encounter for screening for infections with a predominantly sexual mode of transmission: Secondary | ICD-10-CM | POA: Insufficient documentation

## 2020-02-20 DIAGNOSIS — I10 Essential (primary) hypertension: Secondary | ICD-10-CM

## 2020-02-20 DIAGNOSIS — M778 Other enthesopathies, not elsewhere classified: Secondary | ICD-10-CM

## 2020-02-20 DIAGNOSIS — Z130 Encounter for screening for diseases of the blood and blood-forming organs and certain disorders involving the immune mechanism: Secondary | ICD-10-CM

## 2020-02-20 DIAGNOSIS — E1165 Type 2 diabetes mellitus with hyperglycemia: Secondary | ICD-10-CM

## 2020-02-20 LAB — POCT GLYCOSYLATED HEMOGLOBIN (HGB A1C): Hemoglobin A1C: 9.9 % — AB (ref 4.0–5.6)

## 2020-02-20 LAB — GLUCOSE, POCT (MANUAL RESULT ENTRY): POC Glucose: 269 mg/dl — AB (ref 70–99)

## 2020-02-20 MED ORDER — LISINOPRIL 20 MG PO TABS: 20.0000 mg | ORAL_TABLET | Freq: Every day | ORAL | 1 refills | Status: DC

## 2020-02-20 MED ORDER — ROSUVASTATIN CALCIUM 5 MG PO TABS: 5.0000 mg | ORAL_TABLET | Freq: Every day | ORAL | 0 refills | Status: DC

## 2020-02-20 MED ORDER — METHOCARBAMOL 500 MG PO TABS: 500.0000 mg | ORAL_TABLET | Freq: Three times a day (TID) | ORAL | 0 refills | Status: DC | PRN

## 2020-02-20 MED ORDER — MELOXICAM 15 MG PO TABS: 15.0000 mg | ORAL_TABLET | Freq: Every day | ORAL | 0 refills | Status: DC

## 2020-02-20 MED ORDER — GLIPIZIDE 5 MG PO TABS: 5.0000 mg | ORAL_TABLET | Freq: Two times a day (BID) | ORAL | 0 refills | Status: DC

## 2020-02-20 MED ORDER — AMLODIPINE BESYLATE 10 MG PO TABS: 10.0000 mg | ORAL_TABLET | Freq: Every day | ORAL | 1 refills | Status: DC

## 2020-02-20 MED ORDER — SITAGLIPTIN PHOS-METFORMIN HCL 50-1000 MG PO TABS: 1.0000 | ORAL_TABLET | Freq: Two times a day (BID) | ORAL | 0 refills | Status: DC

## 2020-02-20 NOTE — Patient Instructions (Signed)
° ° ° °  If you have lab work done today you will be contacted with your lab results within the next 2 weeks.  If you have not heard from us then please contact us. The fastest way to get your results is to register for My Chart. ° ° °IF you received an x-ray today, you will receive an invoice from [facility]. Please contact [facility] at [phone] with questions or concerns regarding your invoice.  ° °IF you received labwork today, you will receive an invoice from [facility]. Please contact [facility] at [phone] with questions or concerns regarding your invoice.  ° °Our billing staff will not be able to assist you with questions regarding bills from these companies. ° °You will be contacted with the lab results as soon as they are available. The fastest way to get your results is to activate your My Chart account. Instructions are located on the last page of this paperwork. If you have not heard from us regarding the results in 2 weeks, please contact this office. °  ° ° ° °

## 2020-02-20 NOTE — Progress Notes (Signed)
Established Patient Office Visit  Subjective:  Patient ID: Paul Roberson, male    DOB: 21-Mar-1971  Age: 49 y.o. MRN: [id number]  CC:  Chief Complaint  Patient presents with  . Shoulder Pain    patient has some right shoulder pain and soreness for about 6 months thats getting a little worse has been taking some aleve for pain.  . Medication Refill    On all medications    HPI Antero Derosia presents for t2dm, med refill, shoulder pain  T2dm: reports diet has been slipping, stopped fiber supplement. 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   Past Medical History:  Diagnosis Date  . Diabetes mellitus without complication (HCC)   . Hypertension     No past surgical history on file.  Family History  Problem Relation Age of Onset  . Diabetes Mother   . Heart disease Mother   . Hypertension Mother   . Asthma Mother   . Diabetes Father   . Heart disease Father   . Hypertension Father   . Kidney disease Father   . Cancer Father     Social History   Socioeconomic History  . Marital status: Married    Spouse name: Not on file  . Number of children: Not on file  . Years of education: Not on file  . Highest education level: Not on file  Occupational History  . Not on file  Tobacco Use  . Smoking status: Current Some Day Smoker  . Smokeless tobacco: Never Used  Substance and Sexual Activity  . Alcohol use: Yes  . Drug use: No  . Sexual activity: Yes  Other Topics Concern  . Not on file  Social History Narrative  . Not on file   Social Determinants of Health   Financial Resource Strain:   . Difficulty of Paying Living Expenses:   Food Insecurity:   . Worried About Programme researcher, broadcasting/film/video in the Last Year:   . Barista in the Last Year:   Transportation Needs:   . Automotive engineer (Medical):   Marland Kitchen Lack of Transportation (Non-Medical):   Physical Activity:   . Days of Exercise per Week:   . Minutes of Exercise per Session:   Stress:   . Feeling of Stress :   Social Connections:   . Frequency of Communication with Friends and Family:   . Frequency of Social Gatherings with Friends and Family:   . Attends Religious Services:   . Active Member of Clubs or Organizations:   . Attends Banker Meetings:   Marland Kitchen Marital Status:   Intimate Partner Violence:   . Fear of Current or Ex-Partner:   . Emotionally Abused:   Marland Kitchen Physically Abused:   . Sexually Abused:     Outpatient Medications Prior to Visit  Medication Sig Dispense Refill  . amLODipine (NORVASC) 10 MG tablet TAKE 1 TABLET BY MOUTH EVERY DAY 30 tablet 0  . glipiZIDE (GLUCOTROL) 5 MG tablet TAKE 1 TABLET (5 MG TOTAL) BY MOUTH 2 (TWO) TIMES Paul BEFORE A MEAL. 60 tablet 0  . lisinopril (ZESTRIL) 10 MG tablet Take 1 tablet (10 mg total) by mouth Paul. 90 tablet 3  . metFORMIN (GLUCOPHAGE) 1000 MG tablet TAKE 1 TABLET (1,000 MG TOTAL) BY MOUTH 2 (  TWO) TIMES Paul WITH A MEAL. 60 tablet 0  . rosuvastatin (CRESTOR) 5 MG tablet TAKE 1 TABLET BY MOUTH EVERY DAY 90 tablet 0   No facility-administered medications prior to visit.    Allergies  Allergen Reactions  . Penicillins Itching    ROS Review of Systems  Constitutional: Negative.   HENT: Negative.   Eyes: Negative.   Respiratory: Negative.   Cardiovascular: Negative.   Gastrointestinal: Negative.   Endocrine: Negative.   Genitourinary: Negative.   Musculoskeletal: Positive for joint swelling. Negative for arthralgias, back pain, gait problem, myalgias, neck pain and neck stiffness.  Skin: Negative.   Allergic/Immunologic: Negative.   Neurological: Negative.   Hematological: Negative.   Psychiatric/Behavioral: Negative.   All other systems reviewed and are negative.     Objective:    Physical Exam  Constitutional: He  is oriented to person, place, and time. He appears well-developed and well-nourished. No distress.  Cardiovascular: Normal rate, regular rhythm and intact distal pulses. Exam reveals no gallop and no friction rub.  No murmur heard. Pulmonary/Chest: Effort normal and breath sounds normal. No respiratory distress. He has no wheezes. He has no rales. He exhibits no tenderness.  Musculoskeletal:        General: Tenderness (L shoulder joint line) present. No deformity or edema.     Comments: ROM limited by pain in L shoulder through flexion, extension, and abduction  Neurological: He is alert and oriented to person, place, and time.  Skin: Skin is warm and dry. No rash noted. He is not diaphoretic. No erythema. No pallor.  Psychiatric: He has a normal mood and affect. His behavior is normal. Judgment and thought content normal.  Nursing note and vitals reviewed.   BP (!) 162/95   Pulse 96   Temp 98 F (36.7 C) (Temporal)   Resp 16   Ht 5\' 10"  (1.778 m)   Wt 228 lb (103.4 kg)   SpO2 99%   BMI 32.71 kg/m  Wt Readings from Last 3 Encounters:  02/20/20 228 lb (103.4 kg)  12/17/18 239 lb 6.4 oz (108.6 kg)  06/29/18 230 lb 12.8 oz (104.7 kg)     There are no preventive care reminders to display for this patient.  There are no preventive care reminders to display for this patient.  Lab Results  Component Value Date   TSH 2.280 06/29/2018   Lab Results  Component Value Date   WBC 6.5 05/28/2017   HGB 12.5 (L) 05/28/2017   HCT 38.8 05/28/2017   MCV 86 05/28/2017   PLT 293 05/28/2017   Lab Results  Component Value Date   NA 139 12/17/2018   K 4.2 12/17/2018   CO2 20 12/17/2018   GLUCOSE 253 (H) 12/17/2018   BUN 9 12/17/2018   CREATININE 1.20 12/17/2018   BILITOT 0.5 12/17/2018   ALKPHOS 80 12/17/2018   AST 22 12/17/2018   ALT 22 12/17/2018   PROT 6.8 12/17/2018   ALBUMIN 4.3 12/17/2018   CALCIUM 9.5 12/17/2018   Lab Results  Component Value Date   CHOL 175 12/17/2018     Lab Results  Component Value Date   HDL 43 12/17/2018   Lab Results  Component Value Date   LDLCALC 92 12/17/2018   Lab Results  Component Value Date   TRIG 199 (H) 12/17/2018   Lab Results  Component Value Date   CHOLHDL 4.1 12/17/2018   Lab Results  Component Value Date   HGBA1C 9.9 (A) 02/20/2020  Assessment & Plan:   Problem List Items Addressed This Visit      Cardiovascular and Mediastinum   Essential hypertension   Relevant Medications   amLODipine (NORVASC) 10 MG tablet   rosuvastatin (CRESTOR) 5 MG tablet   lisinopril (ZESTRIL) 20 MG tablet     Endocrine   Type 2 diabetes mellitus without complication, without long-term current use of insulin (HCC) - Primary   Relevant Medications   sitaGLIPtin-metformin (JANUMET) 50-1000 MG tablet   amLODipine (NORVASC) 10 MG tablet   glipiZIDE (GLUCOTROL) 5 MG tablet   rosuvastatin (CRESTOR) 5 MG tablet   lisinopril (ZESTRIL) 20 MG tablet   Other Relevant Orders   POCT glycosylated hemoglobin (Hb A1C) (Completed)   POCT glucose (manual entry) (Completed)    Other Visit Diagnoses    Screening for endocrine, metabolic and immunity disorder       Relevant Orders   Comprehensive metabolic panel   TSH   CBC with Differential   Lipid screening       Relevant Orders   Lipid panel   Screening for STD (sexually transmitted disease)       Relevant Orders   RPR   HIV antibody (with reflex)   Urine cytology ancillary only   Screen for STD (sexually transmitted disease)       Type 2 diabetes mellitus with hyperglycemia, without long-term current use of insulin (HCC)       Relevant Medications   sitaGLIPtin-metformin (JANUMET) 50-1000 MG tablet   glipiZIDE (GLUCOTROL) 5 MG tablet   rosuvastatin (CRESTOR) 5 MG tablet   lisinopril (ZESTRIL) 20 MG tablet   Right shoulder tendonitis       Relevant Medications   meloxicam (MOBIC) 15 MG tablet   methocarbamol (ROBAXIN) 500 MG tablet      Meds ordered this  encounter  Medications  . sitaGLIPtin-metformin (JANUMET) 50-1000 MG tablet    Sig: Take 1 tablet by mouth 2 (two) times Paul with a meal.    Dispense:  180 tablet    Refill:  0    Generic preferred    Order Specific Question:   Supervising Provider    Answer:   Delia Chimes A [id number]  . amLODipine (NORVASC) 10 MG tablet    Sig: Take 1 tablet (10 mg total) by mouth Paul.    Dispense:  90 tablet    Refill:  1    Order Specific Question:   Supervising Provider    Answer:   Delia Chimes A [id number]  . glipiZIDE (GLUCOTROL) 5 MG tablet    Sig: Take 1 tablet (5 mg total) by mouth 2 (two) times Paul before a meal.    Dispense:  180 tablet    Refill:  0    Order Specific Question:   Supervising Provider    Answer:   Delia Chimes A [id number]  . rosuvastatin (CRESTOR) 5 MG tablet    Sig: Take 1 tablet (5 mg total) by mouth Paul.    Dispense:  90 tablet    Refill:  0    Order Specific Question:   Supervising Provider    Answer:   Delia Chimes A [id number]  . lisinopril (ZESTRIL) 20 MG tablet    Sig: Take 1 tablet (20 mg total) by mouth Paul.    Dispense:  90 tablet    Refill:  1    Order Specific Question:   Supervising Provider    Answer:   Delia Chimes A [id number]  . meloxicam (MOBIC) 15 MG  tablet    Sig: Take 1 tablet (15 mg total) by mouth Paul.    Dispense:  30 tablet    Refill:  0    Order Specific Question:   Supervising Provider    Answer:   Collie Siad A [id number]  . methocarbamol (ROBAXIN) 500 MG tablet    Sig: Take 1 tablet (500 mg total) by mouth every 8 (eight) hours as needed for muscle spasms.    Dispense:  60 tablet    Refill:  0    Order Specific Question:   Supervising Provider    Answer:   Doristine Bosworth [id number]    Follow-up: Return in about 3 months (around 05/21/2020) for with Sagardia for T2DM.   PLAN  a1c 9.9  Start janumet 50-1000 po bid, continue glipizide 5mg  PO bid ac  Refill bp meds, increase lisinopril to  20mg   Meloxicam, methocarbamol, RICE for shoulder, if worsening or failing to improve consider PT  Or ortho  Patient encouraged to call clinic with any questions, comments, or concerns.  , NP

## 2020-02-21 LAB — LIPID PANEL
Chol/HDL Ratio: 3.7 ratio (ref 0.0–5.0)
Cholesterol, Total: 162 mg/dL (ref 100–199)
HDL: 44 mg/dL (ref >39)
LDL Chol Calc (NIH): 85 mg/dL (ref 0–99)
Triglycerides: 196 mg/dL — ABNORMAL HIGH (ref 0–149)
VLDL Cholesterol Cal: 33 mg/dL (ref 5–40)

## 2020-02-21 LAB — URINE CYTOLOGY ANCILLARY ONLY
Chlamydia: NEGATIVE
Comment: NEGATIVE
Comment: NEGATIVE
Comment: NORMAL
Neisseria Gonorrhea: NEGATIVE
Trichomonas: POSITIVE — AB

## 2020-02-21 LAB — CBC WITH DIFFERENTIAL/PLATELET
Basophils Absolute: 0 10*3/uL (ref 0.0–0.2)
Basos: 1 %
EOS (ABSOLUTE): 0.1 10*3/uL (ref 0.0–0.4)
Eos: 1 %
Hematocrit: 39 % (ref 37.5–51.0)
Hemoglobin: 13.2 g/dL (ref 13.0–17.7)
Immature Grans (Abs): 0 10*3/uL (ref 0.0–0.1)
Immature Granulocytes: 0 %
Lymphocytes Absolute: 2.4 10*3/uL (ref 0.7–3.1)
Lymphs: 36 %
MCH: 29.1 pg (ref 26.6–33.0)
MCHC: 33.8 g/dL (ref 31.5–35.7)
MCV: 86 fL (ref 79–97)
Monocytes Absolute: 0.5 10*3/uL (ref 0.1–0.9)
Monocytes: 7 %
Neutrophils Absolute: 3.6 10*3/uL (ref 1.4–7.0)
Neutrophils: 55 %
Platelets: 286 10*3/uL (ref 150–450)
RBC: 4.53 x10E6/uL (ref 4.14–5.80)
RDW: 13.7 % (ref 11.6–15.4)
WBC: 6.6 10*3/uL (ref 3.4–10.8)

## 2020-02-21 LAB — RPR: RPR Ser Ql: NONREACTIVE

## 2020-02-21 LAB — COMPREHENSIVE METABOLIC PANEL
ALT: 27 IU/L (ref 0–44)
AST: 21 IU/L (ref 0–40)
Albumin/Globulin Ratio: 1.7 (ref 1.2–2.2)
Albumin: 4.3 g/dL (ref 4.0–5.0)
Alkaline Phosphatase: 81 IU/L (ref 39–117)
BUN/Creatinine Ratio: 12 (ref 9–20)
BUN: 15 mg/dL (ref 6–24)
Bilirubin Total: 0.4 mg/dL (ref 0.0–1.2)
CO2: 19 mmol/L — ABNORMAL LOW (ref 20–29)
Calcium: 9.6 mg/dL (ref 8.7–10.2)
Chloride: 104 mmol/L (ref 96–106)
Creatinine, Ser: 1.22 mg/dL (ref 0.76–1.27)
GFR calc Af Amer: 82 mL/min/{1.73_m2} (ref >59)
GFR calc non Af Amer: 71 mL/min/{1.73_m2} (ref >59)
Globulin, Total: 2.6 g/dL (ref 1.5–4.5)
Glucose: 258 mg/dL — ABNORMAL HIGH (ref 65–99)
Potassium: 4 mmol/L (ref 3.5–5.2)
Sodium: 139 mmol/L (ref 134–144)
Total Protein: 6.9 g/dL (ref 6.0–8.5)

## 2020-02-21 LAB — TSH: TSH: 2.76 u[IU]/mL (ref 0.450–4.500)

## 2020-02-21 LAB — HIV ANTIBODY (ROUTINE TESTING W REFLEX): HIV Screen 4th Generation wRfx: NONREACTIVE

## 2020-02-29 ENCOUNTER — Other Ambulatory Visit: Payer: Self-pay | Admitting: Registered Nurse

## 2020-02-29 DIAGNOSIS — A599 Trichomoniasis, unspecified: Secondary | ICD-10-CM

## 2020-02-29 MED ORDER — METRONIDAZOLE 500 MG PO TABS: 500.0000 mg | ORAL_TABLET | Freq: Two times a day (BID) | ORAL | 0 refills | Status: DC

## 2020-02-29 NOTE — Progress Notes (Signed)
Called patient with lab results Trichomonas positive Metronidazole 500mg  PO bid for 7 days Partner treatment encouraged  Return to clinic for follow up testing in around 3 mo  Pt understands  , NP

## 2020-03-07 ENCOUNTER — Other Ambulatory Visit: Payer: Self-pay | Admitting: Registered Nurse

## 2020-03-07 DIAGNOSIS — M778 Other enthesopathies, not elsewhere classified: Secondary | ICD-10-CM

## 2020-03-07 NOTE — Telephone Encounter (Signed)
Patient is requesting a refill of the following medications: Requested Prescriptions   Pending Prescriptions Disp Refills  . methocarbamol (ROBAXIN) 500 MG tablet [Pharmacy Med Name: METHOCARBAMOL 500 MG TABLET] 60 tablet 0    Sig: TAKE 1 TABLET (500 MG TOTAL) BY MOUTH EVERY 8 (EIGHT) HOURS AS NEEDED FOR MUSCLE SPASMS.    Date of patient request: 03/07/2020 Last office visit: 02/20/2020 Date of last refill: 02/20/2020 Last refill amount: 60 tablets Follow up time period per chart: 05/28/2020

## 2020-03-13 ENCOUNTER — Telehealth: Payer: Self-pay

## 2020-03-13 ENCOUNTER — Other Ambulatory Visit: Payer: Self-pay

## 2020-03-13 ENCOUNTER — Other Ambulatory Visit: Payer: Self-pay | Admitting: Registered Nurse

## 2020-03-13 DIAGNOSIS — M778 Other enthesopathies, not elsewhere classified: Secondary | ICD-10-CM

## 2020-03-13 MED ORDER — MELOXICAM 15 MG PO TABS: 15.0000 mg | ORAL_TABLET | Freq: Every day | ORAL | 0 refills | Status: DC

## 2020-03-13 NOTE — Telephone Encounter (Signed)
Per Dr. Alvy Bimler of to send refill on meloxicam, pt is aware of doctors orders which are not to take the medication daily

## 2020-04-23 ENCOUNTER — Other Ambulatory Visit: Payer: Self-pay

## 2020-04-23 ENCOUNTER — Encounter: Payer: Self-pay | Admitting: Emergency Medicine

## 2020-04-23 ENCOUNTER — Ambulatory Visit (INDEPENDENT_AMBULATORY_CARE_PROVIDER_SITE_OTHER): Payer: BC Managed Care – PPO | Admitting: Emergency Medicine

## 2020-04-23 VITALS — BP 143/97 | HR 104 | Temp 97.4°F | Resp 16 | Ht 70.0 in | Wt 216.0 lb

## 2020-04-23 DIAGNOSIS — I10 Essential (primary) hypertension: Secondary | ICD-10-CM | POA: Diagnosis not present

## 2020-04-23 DIAGNOSIS — M5431 Sciatica, right side: Secondary | ICD-10-CM

## 2020-04-23 DIAGNOSIS — E1159 Type 2 diabetes mellitus with other circulatory complications: Secondary | ICD-10-CM | POA: Diagnosis not present

## 2020-04-23 MED ORDER — METHYLPREDNISOLONE ACETATE 80 MG/ML IJ SUSP
80.0000 mg | Freq: Once | INTRAMUSCULAR | Status: AC
  Administered 2020-04-23: 80 mg via INTRAMUSCULAR

## 2020-04-23 MED ORDER — PREDNISONE 20 MG PO TABS: 40.0000 mg | ORAL_TABLET | Freq: Every day | ORAL | 0 refills | Status: AC

## 2020-04-23 NOTE — Progress Notes (Signed)
Paul Roberson 49 y.o.   Chief Complaint  Patient presents with  . Hip Pain    RIGHT radiates down to RIGHT ankle per patient since February this year hurts with pressure     HISTORY OF PRESENT ILLNESS: This is a 49 y.o. male complaining of right-sided lumbar pain radiating down the back of the right leg for the past several weeks.  #1 diabetes: On Janumet 50-1000, supposed to take it twice a day but only taking it once a day.  Glipizide 5 mg, supposed to take it twice a day but only taking it once a day. Lab Results  Component Value Date   HGBA1C 9.9 (A) 02/20/2020  #2 hypertension: On amlodipine 10 mg and lisinopril 20 mg daily. BP Readings from Last 3 Encounters:  04/23/20 (!) 143/97  02/20/20 (!) 162/95  12/17/18 (!) 138/96    Back Pain This is a recurrent problem. The current episode started 1 to 4 weeks ago. The problem occurs constantly. The problem has been gradually worsening since onset. The pain is present in the lumbar spine. The quality of the pain is described as aching. The pain radiates to the right thigh and right knee. The pain is at a severity of 7/10. The pain is moderate. The pain is the same all the time. The symptoms are aggravated by standing, position, twisting and bending. Stiffness is present all day. Associated symptoms include leg pain. Pertinent negatives include no abdominal pain, bladder incontinence, bowel incontinence, chest pain, dysuria, fever, headaches, numbness, paresis, paresthesias, pelvic pain, perianal numbness, tingling, weakness or weight loss. Risk factors: None. He has tried NSAIDs and analgesics for the symptoms. The treatment provided mild relief.  Has been taking meloxicam and Robaxin as needed.   Prior to Admission medications   Medication Sig Start Date End Date Taking? Authorizing Provider  amLODipine (NORVASC) 10 MG tablet Take 1 tablet (10 mg total) by mouth daily. 02/20/20  Yes Janeece Agee, NP  glipiZIDE (GLUCOTROL) 5 MG tablet  Take 1 tablet (5 mg total) by mouth 2 (two) times daily before a meal. 02/20/20  Yes Janeece Agee, NP  lisinopril (ZESTRIL) 20 MG tablet Take 1 tablet (20 mg total) by mouth daily. 02/20/20  Yes Janeece Agee, NP  meloxicam (MOBIC) 15 MG tablet Take 1 tablet (15 mg total) by mouth daily. 03/13/20  Yes Yvon Mccord, Eilleen Kempf, MD  methocarbamol (ROBAXIN) 500 MG tablet TAKE 1 TABLET (500 MG TOTAL) BY MOUTH EVERY 8 (EIGHT) HOURS AS NEEDED FOR MUSCLE SPASMS. 03/07/20  Yes Oluwadamilare Tobler, Eilleen Kempf, MD  rosuvastatin (CRESTOR) 5 MG tablet Take 1 tablet (5 mg total) by mouth daily. 02/20/20  Yes Janeece Agee, NP  sitaGLIPtin-metformin (JANUMET) 50-1000 MG tablet Take 1 tablet by mouth 2 (two) times daily with a meal. 02/20/20  Yes Janeece Agee, NP  metroNIDAZOLE (FLAGYL) 500 MG tablet Take 1 tablet (500 mg total) by mouth 2 (two) times daily. Patient not taking: Reported on 04/23/2020 02/29/20   Janeece Agee, NP    Allergies  Allergen Reactions  . Penicillins Itching  . Sulfa Antibiotics Other (See Comments)    Causes real bad headache    Patient Active Problem List   Diagnosis Date Noted  . Hypertension associated with type 2 diabetes mellitus (HCC) 05/28/2017  . Type 2 diabetes mellitus without complication, without long-term current use of insulin (HCC) 05/28/2017    Past Medical History:  Diagnosis Date  . Diabetes mellitus without complication (HCC)   . Hypertension     History  reviewed. No pertinent surgical history.  Social History   Socioeconomic History  . Marital status: Married    Spouse name: Not on file  . Number of children: Not on file  . Years of education: Not on file  . Highest education level: Not on file  Occupational History  . Not on file  Tobacco Use  . Smoking status: Current Some Day Smoker  . Smokeless tobacco: Never Used  Substance and Sexual Activity  . Alcohol use: Yes  . Drug use: No  . Sexual activity: Yes  Other Topics Concern  . Not on file    Social History Narrative  . Not on file   Social Determinants of Health   Financial Resource Strain:   . Difficulty of Paying Living Expenses:   Food Insecurity:   . Worried About Programme researcher, broadcasting/film/video in the Last Year:   . Barista in the Last Year:   Transportation Needs:   . Freight forwarder (Medical):   Marland Kitchen Lack of Transportation (Non-Medical):   Physical Activity:   . Days of Exercise per Week:   . Minutes of Exercise per Session:   Stress:   . Feeling of Stress :   Social Connections:   . Frequency of Communication with Friends and Family:   . Frequency of Social Gatherings with Friends and Family:   . Attends Religious Services:   . Active Member of Clubs or Organizations:   . Attends Banker Meetings:   Marland Kitchen Marital Status:   Intimate Partner Violence:   . Fear of Current or Ex-Partner:   . Emotionally Abused:   Marland Kitchen Physically Abused:   . Sexually Abused:     Family History  Problem Relation Age of Onset  . Diabetes Mother   . Heart disease Mother   . Hypertension Mother   . Asthma Mother   . Diabetes Father   . Heart disease Father   . Hypertension Father   . Kidney disease Father   . Cancer Father      Review of Systems  Constitutional: Negative for fever and weight loss.  HENT: Negative.  Negative for congestion and sore throat.   Respiratory: Negative.  Negative for shortness of breath.   Cardiovascular: Negative for chest pain and palpitations.  Gastrointestinal: Negative.  Negative for abdominal pain, blood in stool, bowel incontinence, diarrhea, melena, nausea and vomiting.  Genitourinary: Negative.  Negative for bladder incontinence, dysuria, flank pain, frequency, hematuria, pelvic pain and urgency.  Musculoskeletal: Positive for back pain.  Skin: Negative.  Negative for rash.  Neurological: Negative.  Negative for tingling, weakness, numbness, headaches and paresthesias.  All other systems reviewed and are  negative.  Vitals:   04/23/20 1101  BP: (!) 143/97  Pulse: (!) 104  Resp: 16  Temp: (!) 97.4 F (36.3 C)  SpO2: 99%  Did not take blood pressure medication this morning.   Physical Exam Vitals reviewed.  Constitutional:      Appearance: Normal appearance.  HENT:     Head: Normocephalic.  Eyes:     Extraocular Movements: Extraocular movements intact.     Conjunctiva/sclera: Conjunctivae normal.     Pupils: Pupils are equal, round, and reactive to light.  Cardiovascular:     Rate and Rhythm: Normal rate and regular rhythm.     Pulses: Normal pulses.     Heart sounds: Normal heart sounds.  Pulmonary:     Effort: Pulmonary effort is normal.     Breath  sounds: Normal breath sounds.  Abdominal:     Palpations: Abdomen is soft.     Tenderness: There is no abdominal tenderness.  Musculoskeletal:     Cervical back: Normal range of motion and neck supple.     Lumbar back: Spasms and tenderness present. No swelling or bony tenderness. Decreased range of motion.  Skin:    General: Skin is warm and dry.     Capillary Refill: Capillary refill takes less than 2 seconds.  Neurological:     General: No focal deficit present.     Mental Status: He is alert and oriented to person, place, and time.  Psychiatric:        Mood and Affect: Mood normal.        Behavior: Behavior normal.    A total of 30 minutes was spent with the patient, greater than 50% of which was in counseling/coordination of care regarding differential diagnosis of sciatica, treatment including use of corticosteroids, review of most recent office visit notes, review of all medications, hypertension and diabetes, review of most recent blood work results, need for orthopedic evaluation, prognosis, cardiovascular risks associated with hypertension and diabetes, diet and nutrition, need for follow-up.   ASSESSMENT & PLAN: [place] was seen today for hip pain.  Diagnoses and all orders for this visit:  Sciatica, right  side -     methylPREDNISolone acetate (DEPO-MEDROL) injection 80 mg -     predniSONE (DELTASONE) 20 MG tablet; Take 2 tablets (40 mg total) by mouth daily with breakfast for 5 days. -     Ambulatory referral to Orthopedic Surgery  Hypertension associated with type 2 diabetes mellitus ([place]) -     Ambulatory referral to Ophthalmology    Patient Instructions       If you have lab work done today you will be contacted with your lab results within the next 2 weeks.  If you have not heard from [place] then please contact us. The fastest way to get your results is to register for My Chart.   IF you received an x-ray today, you will receive an invoice from [facility]. Please contact [facility] at [phone] with questions or concerns regarding your invoice.   IF you received labwork today, you will receive an invoice from [place]. Please contact [facility] at [phone] with questions or concerns regarding your invoice.   Our billing staff will not be able to assist you with questions regarding bills from these companies.  You will be contacted with the lab results as soon as they are available. The fastest way to get your results is to activate your My Chart account. Instructions are located on the last page of this paperwork. If you have not heard from [place] regarding the results in 2 weeks, please contact this office.     Sciatica  Sciatica is pain, weakness, tingling, or loss of feeling (numbness) along the sciatic nerve. The sciatic nerve starts in the lower back and goes down the back of each leg. Sciatica usually goes away on its own or with treatment. Sometimes, sciatica may come back (recur). What are the causes? This condition happens when the sciatic nerve is pinched or has pressure put on it. This may be the result of:  A disk in between the bones of the spine bulging out too far (herniated disk).  Changes in the spinal disks that occur with aging.  A  condition that affects a muscle in the butt.  Extra bone growth near the sciatic  nerve.  A break (fracture) of the area between your hip bones (pelvis).  Pregnancy.  Tumor. This is rare. What increases the risk? You are more likely to develop this condition if you:  Play sports that put pressure or stress on the spine.  Have poor strength and ease of movement (flexibility).  Have had a back injury in the past.  Have had back surgery.  Sit for long periods of time.  Do activities that involve bending or lifting over and over again.  Are very overweight (obese). What are the signs or symptoms? Symptoms can vary from mild to very bad. They may include:  Any of these problems in the lower back, leg, hip, or butt: ? Mild tingling, loss of feeling, or dull aches. ? Burning sensations. ? Sharp pains.  Loss of feeling in the back of the calf or the sole of the foot.  Leg weakness.  Very bad back pain that makes it hard to move. These symptoms may get worse when you cough, sneeze, or laugh. They may also get worse when you sit or stand for long periods of time. How is this treated? This condition often gets better without any treatment. However, treatment may include:  Changing or cutting back on physical activity when you have pain.  Doing exercises and stretching.  Putting ice or heat on the affected area.  Medicines that help: ? To relieve pain and swelling. ? To relax your muscles.  Shots (injections) of medicines that help to relieve pain, irritation, and swelling.  Surgery. Follow these instructions at home: Medicines  Take over-the-counter and prescription medicines only as told by your doctor.  Ask your doctor if the medicine prescribed to you: ? Requires you to avoid driving or using heavy machinery. ? Can cause trouble pooping (constipation). You may need to take these steps to prevent or treat trouble pooping:  Drink enough fluids to keep your pee  (urine) pale yellow.  Take over-the-counter or prescription medicines.  Eat foods that are high in fiber. These include beans, whole grains, and fresh fruits and vegetables.  Limit foods that are high in fat and sugar. These include fried or sweet foods. Managing pain      If told, put ice on the affected area. ? Put ice in a plastic bag. ? Place a towel between your skin and the bag. ? Leave the ice on for 20 minutes, 2-3 times a day.  If told, put heat on the affected area. Use the heat source that your doctor tells you to use, such as a moist heat pack or a heating pad. ? Place a towel between your skin and the heat source. ? Leave the heat on for 20-30 minutes. ? Remove the heat if your skin turns bright red. This is very important if you are unable to feel pain, heat, or cold. You may have a greater risk of getting burned. Activity   Return to your normal activities as told by your doctor. Ask your doctor what activities are safe for you.  Avoid activities that make your symptoms worse.  Take short rests during the day. ? When you rest for a long time, do some physical activity or stretching between periods of rest. ? Avoid sitting for a long time without moving. Get up and move around at least one time each hour.  Exercise and stretch regularly, as told by your doctor.  Do not lift anything that is heavier than 10 lb (4.5 kg) while you  have symptoms of sciatica. ? Avoid lifting heavy things even when you do not have symptoms. ? Avoid lifting heavy things over and over.  When you lift objects, always lift in a way that is safe for your body. To do this, you should: ? Bend your knees. ? Keep the object close to your body. ? Avoid twisting. General instructions  Stay at a healthy weight.  Wear comfortable shoes that support your feet. Avoid wearing high heels.  Avoid sleeping on a mattress that is too soft or too hard. You might have less pain if you sleep on a  mattress that is firm enough to support your back.  Keep all follow-up visits as told by your doctor. This is important. Contact a doctor if:  You have pain that: ? Wakes you up when you are sleeping. ? Gets worse when you lie down. ? Is worse than the pain you have had in the past. ? Lasts longer than 4 weeks.  You lose weight without trying. Get help right away if:  You cannot control when you pee (urinate) or poop (have a bowel movement).  You have weakness in any of these areas and it gets worse: ? Lower back. ? The area between your hip bones. ? Butt. ? Legs.  You have redness or swelling of your back.  You have a burning feeling when you pee. Summary  Sciatica is pain, weakness, tingling, or loss of feeling (numbness) along the sciatic nerve.  This condition happens when the sciatic nerve is pinched or has pressure put on it.  Sciatica can cause pain, tingling, or loss of feeling (numbness) in the lower back, legs, hips, and butt.  Treatment often includes rest, exercise, medicines, and putting ice or heat on the affected area. This information is not intended to replace advice given to you by your health care provider. Make sure you discuss any questions you have with your health care provider. Document Revised: 11/22/2018 Document Reviewed: 11/22/2018 Elsevier Patient Education  2020 Elsevier Inc.      Edwina BarthMiguel Chaniece Barbato, MD [facility]

## 2020-04-23 NOTE — Patient Instructions (Addendum)
   If you have lab work done today you will be contacted with your lab results within the next 2 weeks.  If you have not heard from us then please contact us. The fastest way to get your results is to register for My Chart.   IF you received an x-ray today, you will receive an invoice from [facility]. Please contact [facility] at [phone] with questions or concerns regarding your invoice.   IF you received labwork today, you will receive an invoice from [facility]. Please contact [facility] at [phone] with questions or concerns regarding your invoice.   Our billing staff will not be able to assist you with questions regarding bills from these companies.  You will be contacted with the lab results as soon as they are available. The fastest way to get your results is to activate your My Chart account. Instructions are located on the last page of this paperwork. If you have not heard from us regarding the results in 2 weeks, please contact this office.     Sciatica  Sciatica is pain, weakness, tingling, or loss of feeling (numbness) along the sciatic nerve. The sciatic nerve starts in the lower back and goes down the back of each leg. Sciatica usually goes away on its own or with treatment. Sometimes, sciatica may come back (recur). What are the causes? This condition happens when the sciatic nerve is pinched or has pressure put on it. This may be the result of:  A disk in between the bones of the spine bulging out too far (herniated disk).  Changes in the spinal disks that occur with aging.  A condition that affects a muscle in the butt.  Extra bone growth near the sciatic nerve.  A break (fracture) of the area between your hip bones (pelvis).  Pregnancy.  Tumor. This is rare. What increases the risk? You are more likely to develop this condition if you:  Play sports that put pressure or stress on the spine.  Have poor strength and ease of movement  (flexibility).  Have had a back injury in the past.  Have had back surgery.  Sit for long periods of time.  Do activities that involve bending or lifting over and over again.  Are very overweight (obese). What are the signs or symptoms? Symptoms can vary from mild to very bad. They may include:  Any of these problems in the lower back, leg, hip, or butt: ? Mild tingling, loss of feeling, or dull aches. ? Burning sensations. ? Sharp pains.  Loss of feeling in the back of the calf or the sole of the foot.  Leg weakness.  Very bad back pain that makes it hard to move. These symptoms may get worse when you cough, sneeze, or laugh. They may also get worse when you sit or stand for long periods of time. How is this treated? This condition often gets better without any treatment. However, treatment may include:  Changing or cutting back on physical activity when you have pain.  Doing exercises and stretching.  Putting ice or heat on the affected area.  Medicines that help: ? To relieve pain and swelling. ? To relax your muscles.  Shots (injections) of medicines that help to relieve pain, irritation, and swelling.  Surgery. Follow these instructions at home: Medicines  Take over-the-counter and prescription medicines only as told by your doctor.  Ask your doctor if the medicine prescribed to you: ? Requires you to avoid driving or using   heavy machinery. ? Can cause trouble pooping (constipation). You may need to take these steps to prevent or treat trouble pooping:  Drink enough fluids to keep your pee (urine) pale yellow.  Take over-the-counter or prescription medicines.  Eat foods that are high in fiber. These include beans, whole grains, and fresh fruits and vegetables.  Limit foods that are high in fat and sugar. These include fried or sweet foods. Managing pain      If told, put ice on the affected area. ? Put ice in a plastic bag. ? Place a towel between  your skin and the bag. ? Leave the ice on for 20 minutes, 2-3 times a day.  If told, put heat on the affected area. Use the heat source that your doctor tells you to use, such as a moist heat pack or a heating pad. ? Place a towel between your skin and the heat source. ? Leave the heat on for 20-30 minutes. ? Remove the heat if your skin turns bright red. This is very important if you are unable to feel pain, heat, or cold. You may have a greater risk of getting burned. Activity   Return to your normal activities as told by your doctor. Ask your doctor what activities are safe for you.  Avoid activities that make your symptoms worse.  Take short rests during the day. ? When you rest for a long time, do some physical activity or stretching between periods of rest. ? Avoid sitting for a long time without moving. Get up and move around at least one time each hour.  Exercise and stretch regularly, as told by your doctor.  Do not lift anything that is heavier than 10 lb (4.5 kg) while you have symptoms of sciatica. ? Avoid lifting heavy things even when you do not have symptoms. ? Avoid lifting heavy things over and over.  When you lift objects, always lift in a way that is safe for your body. To do this, you should: ? Bend your knees. ? Keep the object close to your body. ? Avoid twisting. General instructions  Stay at a healthy weight.  Wear comfortable shoes that support your feet. Avoid wearing high heels.  Avoid sleeping on a mattress that is too soft or too hard. You might have less pain if you sleep on a mattress that is firm enough to support your back.  Keep all follow-up visits as told by your doctor. This is important. Contact a doctor if:  You have pain that: ? Wakes you up when you are sleeping. ? Gets worse when you lie down. ? Is worse than the pain you have had in the past. ? Lasts longer than 4 weeks.  You lose weight without trying. Get help right away  if:  You cannot control when you pee (urinate) or poop (have a bowel movement).  You have weakness in any of these areas and it gets worse: ? Lower back. ? The area between your hip bones. ? Butt. ? Legs.  You have redness or swelling of your back.  You have a burning feeling when you pee. Summary  Sciatica is pain, weakness, tingling, or loss of feeling (numbness) along the sciatic nerve.  This condition happens when the sciatic nerve is pinched or has pressure put on it.  Sciatica can cause pain, tingling, or loss of feeling (numbness) in the lower back, legs, hips, and butt.  Treatment often includes rest, exercise, medicines, and putting ice or heat   on the affected area. This information is not intended to replace advice given to you by your health care provider. Make sure you discuss any questions you have with your health care provider. Document Revised: 11/22/2018 Document Reviewed: 11/22/2018 Elsevier Patient Education  2020 [employer]vier Inc.  

## 2020-04-24 ENCOUNTER — Ambulatory Visit (INDEPENDENT_AMBULATORY_CARE_PROVIDER_SITE_OTHER): Payer: BC Managed Care – PPO | Admitting: Family Medicine

## 2020-04-24 ENCOUNTER — Ambulatory Visit: Payer: Self-pay

## 2020-04-24 ENCOUNTER — Encounter: Payer: Self-pay | Admitting: Family Medicine

## 2020-04-24 DIAGNOSIS — M5441 Lumbago with sciatica, right side: Secondary | ICD-10-CM

## 2020-04-24 MED ORDER — GABAPENTIN 100 MG PO CAPS: ORAL_CAPSULE | ORAL | 3 refills | Status: DC
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
## 2020-04-24 NOTE — Progress Notes (Signed)
I saw and examined the patient with Dr. Robby Sermon and agree with assessment and plan as outlined.    Chronic LBP with mostly right-sided sciatica symptoms.  Neuro exam non-focal.  Will try PT, gabapentin.  Referral for ESI.  Will order MRI if fails to improve.

## 2020-04-25 ENCOUNTER — Ambulatory Visit: Payer: BC Managed Care – PPO | Attending: Family Medicine | Admitting: Physical Therapy

## 2020-05-13 ENCOUNTER — Other Ambulatory Visit: Payer: Self-pay | Admitting: Registered Nurse

## 2020-05-13 DIAGNOSIS — E1165 Type 2 diabetes mellitus with hyperglycemia: Secondary | ICD-10-CM

## 2020-05-19 ENCOUNTER — Other Ambulatory Visit: Payer: Self-pay | Admitting: Registered Nurse

## 2020-05-19 DIAGNOSIS — E119 Type 2 diabetes mellitus without complications: Secondary | ICD-10-CM

## 2020-05-24 ENCOUNTER — Telehealth: Payer: Self-pay

## 2020-05-24 NOTE — Telephone Encounter (Signed)
Patient called in saying he wants to resch his appt on 7/12. Says he is not going to be able to make it

## 2020-05-25 ENCOUNTER — Telehealth: Payer: Self-pay | Admitting: Physical Medicine and Rehabilitation

## 2020-05-25 NOTE — Telephone Encounter (Signed)
Patient called requesting a call back. Patient needs to reschedule appt for injection. Patient phone number is [phone].

## 2020-05-25 NOTE — Telephone Encounter (Signed)
Pt called wanting to know if he could reschedule his 06/19/20 appt for 06/28/20 due to his job.   [phone]

## 2020-05-25 NOTE — Telephone Encounter (Signed)
Pt is r/s for 06/19/20 with driver.

## 2020-05-25 NOTE — Telephone Encounter (Signed)
Pt appt is r/s to 06/28/20 at 1000a

## 2020-05-25 NOTE — Telephone Encounter (Signed)
Patient r/s by Antony Blackbird for 06/19/20 at 2:15pm

## 2020-05-28 ENCOUNTER — Encounter: Payer: BC Managed Care – PPO | Admitting: Physical Medicine and Rehabilitation

## 2020-05-28 ENCOUNTER — Encounter (INDEPENDENT_AMBULATORY_CARE_PROVIDER_SITE_OTHER): Payer: BC Managed Care – PPO | Admitting: Ophthalmology

## 2020-05-28 ENCOUNTER — Ambulatory Visit: Payer: BC Managed Care – PPO | Admitting: Emergency Medicine

## 2020-05-29 ENCOUNTER — Encounter (INDEPENDENT_AMBULATORY_CARE_PROVIDER_SITE_OTHER): Payer: BC Managed Care – PPO | Admitting: Ophthalmology

## 2020-06-11 ENCOUNTER — Telehealth: Payer: Self-pay

## 2020-06-11 NOTE — Telephone Encounter (Signed)
Rescheduled

## 2020-06-11 NOTE — Telephone Encounter (Signed)
Patient called in wanting to move his upcoming appt on 8/12 to a sooner date

## 2020-06-18 ENCOUNTER — Ambulatory Visit: Payer: Self-pay

## 2020-06-18 ENCOUNTER — Ambulatory Visit: Payer: BC Managed Care – PPO | Admitting: Physical Medicine and Rehabilitation

## 2020-06-18 ENCOUNTER — Other Ambulatory Visit: Payer: Self-pay

## 2020-06-18 ENCOUNTER — Encounter: Payer: Self-pay | Admitting: Physical Medicine and Rehabilitation

## 2020-06-18 VITALS — BP 123/78 | HR 98

## 2020-06-18 DIAGNOSIS — M5416 Radiculopathy, lumbar region: Secondary | ICD-10-CM | POA: Diagnosis not present

## 2020-06-18 MED ORDER — METHYLPREDNISOLONE ACETATE 80 MG/ML IJ SUSP
80.0000 mg | Freq: Once | INTRAMUSCULAR | Status: AC
  Administered 2020-06-18: 80 mg

## 2020-06-18 NOTE — Progress Notes (Signed)
Pt states he has pain from his buttock thattravel down his leg to his right big toe. Pt states everything makes his pain worse. Pt state meds makes the pain feel a little better.   Numeric Pain Rating Scale and Functional Assessment Average Pain 4   In the last MONTH (on 0-10 scale) has pain interfered with the following?  1. General activity like being  able to carry out your everyday physical activities such as walking, climbing stairs, carrying groceries, or moving a chair?  Rating(10)   +Driver, -BT, -Dye Allergies.

## 2020-06-19 ENCOUNTER — Encounter: Payer: BC Managed Care – PPO | Admitting: Physical Medicine and Rehabilitation

## 2020-06-19 NOTE — Procedures (Signed)
Lumbosacral Transforaminal Epidural Steroid Injection - Sub-Pedicular Approach with Fluoroscopic Guidance  Patient: Paul Roberson      Date of Birth: 1971/05/05 MRN: [id number] PCP: Georgina Quint, MD      Visit Date: 06/18/2020   Universal Protocol:    Date/Time: 06/18/2020  Consent Given By: the patient  Position: PRONE  Additional Comments: Vital signs were monitored before and after the procedure. Patient was prepped and draped in the usual sterile fashion. The correct patient, procedure, and site was verified.   Injection Procedure Details:  Procedure Site One Meds Administered:  Meds ordered this encounter  Medications  . methylPREDNISolone acetate (DEPO-MEDROL) injection 80 mg    Laterality: Right  Location/Site:  L5-S1  Needle size: 22 G  Needle type: Spinal  Needle Placement: Transforaminal  Findings:    -Comments: Excellent flow of contrast along the nerve, nerve root and into the epidural space.  Procedure Details: After squaring off the end-plates to get a true AP view, the C-arm was positioned so that an oblique view of the foramen as noted above was visualized. The target area is just inferior to the "nose of the scotty dog" or sub pedicular. The soft tissues overlying this structure were infiltrated with 2-3 ml. of 1% Lidocaine without Epinephrine.  The spinal needle was inserted toward the target using a "trajectory" view along the fluoroscope beam.  Under AP and lateral visualization, the needle was advanced so it did not puncture dura and was located close the 6 O'Clock position of the pedical in AP tracterory. Biplanar projections were used to confirm position. Aspiration was confirmed to be negative for CSF and/or blood. A 1-2 ml. volume of Isovue-250 was injected and flow of contrast was noted at each level. Radiographs were obtained for documentation purposes.   After attaining the desired flow of contrast documented above, a 0.5 to 1.0 ml test  dose of 0.25% Marcaine was injected into each respective transforaminal space.  The patient was observed for 90 seconds post injection.  After no sensory deficits were reported, and normal lower extremity motor function was noted,   the above injectate was administered so that equal amounts of the injectate were placed at each foramen (level) into the transforaminal epidural space.   Additional Comments:  The patient tolerated the procedure well Dressing: 2 x 2 sterile gauze and Band-Aid    Post-procedure details: Patient was observed during the procedure. Post-procedure instructions were reviewed.  Patient left the clinic in stable condition.

## 2020-06-19 NOTE — Progress Notes (Signed)
Paul Roberson - 49 y.o. male MRN [id number]  Date of birth: 1971/07/29  Office Visit Note: Visit Date: 06/18/2020 PCP: Georgina Quint, MD Referred by: Georgina Quint, *  Subjective: Chief Complaint  Patient presents with  . Right Hip - Pain  . Right Leg - Pain  . Right Foot - Pain   HPI:  Paul Roberson is a 49 y.o. male who comes in today at the request of Dr. Lavada Mesi for planned Right L5-S1 Lumbar epidural steroid injection with fluoroscopic guidance.  The patient has failed conservative care including home exercise, medications, time and activity modification.  This injection will be diagnostic and hopefully therapeutic.  Please see requesting physician notes for further details and justification.  Patient is a [employer] with classic L5 dermatome radiculopathy.  No red flag complaints.  Does have diabetes.  We will complete diagnostic right L5 transforaminal injection.  Depending on relief would consider definitive MRI for looking at specific cause.   ROS Otherwise per HPI.  Assessment & Plan: Visit Diagnoses:  1. Lumbar radiculopathy     Plan: No additional findings.   Meds & Orders:  Meds ordered this encounter  Medications  . methylPREDNISolone acetate (DEPO-MEDROL) injection 80 mg    Orders Placed This Encounter  Procedures  . XR C-ARM NO REPORT  . Epidural Steroid injection    Follow-up: Return for visit to requesting physician as needed.   Procedures: No procedures performed  Lumbosacral Transforaminal Epidural Steroid Injection - Sub-Pedicular Approach with Fluoroscopic Guidance  Patient: Paul Roberson      Date of Birth: [birth year] MRN: [id number] PCP: Georgina Quint, MD      Visit Date: 06/18/2020   Universal Protocol:    Date/Time: 06/18/2020  Consent Given By: the patient  Position: PRONE  Additional Comments: Vital signs were monitored before and after the procedure. Patient was prepped and draped in the usual sterile  fashion. The correct patient, procedure, and site was verified.   Injection Procedure Details:  Procedure Site One Meds Administered:  Meds ordered this encounter  Medications  . methylPREDNISolone acetate (DEPO-MEDROL) injection 80 mg    Laterality: Right  Location/Site:  L5-S1  Needle size: 22 G  Needle type: Spinal  Needle Placement: Transforaminal  Findings:    -Comments: Excellent flow of contrast along the nerve, nerve root and into the epidural space.  Procedure Details: After squaring off the end-plates to get a true AP view, the C-arm was positioned so that an oblique view of the foramen as noted above was visualized. The target area is just inferior to the "nose of the scotty dog" or sub pedicular. The soft tissues overlying this structure were infiltrated with 2-3 ml. of 1% Lidocaine without Epinephrine.  The spinal needle was inserted toward the target using a "trajectory" view along the fluoroscope beam.  Under AP and lateral visualization, the needle was advanced so it did not puncture dura and was located close the 6 O'Clock position of the pedical in AP tracterory. Biplanar projections were used to confirm position. Aspiration was confirmed to be negative for CSF and/or blood. A 1-2 ml. volume of Isovue-250 was injected and flow of contrast was noted at each level. Radiographs were obtained for documentation purposes.   After attaining the desired flow of contrast documented above, a 0.5 to 1.0 ml test dose of 0.25% Marcaine was injected into each respective transforaminal space.  The patient was observed for 90 seconds post injection.  After no sensory deficits  were reported, and normal lower extremity motor function was noted,   the above injectate was administered so that equal amounts of the injectate were placed at each foramen (level) into the transforaminal epidural space.   Additional Comments:  The patient tolerated the procedure well Dressing: 2 x 2  sterile gauze and Band-Aid    Post-procedure details: Patient was observed during the procedure. Post-procedure instructions were reviewed.  Patient left the clinic in stable condition.      Clinical History: No specialty comments available.     Objective:  VS:  HT:    WT:   BMI:     BP:123/78  HR:98bpm  TEMP: ( )  RESP:  Physical Exam Constitutional:      General: He is not in acute distress.    Appearance: Normal appearance. He is not ill-appearing.  HENT:     Head: Normocephalic and atraumatic.     Right Ear: External ear normal.     Left Ear: External ear normal.  Eyes:     Extraocular Movements: Extraocular movements intact.  Cardiovascular:     Rate and Rhythm: Normal rate.     Pulses: Normal pulses.  Abdominal:     General: There is no distension.     Palpations: Abdomen is soft.  Musculoskeletal:        General: No tenderness or signs of injury.     Right lower leg: No edema.     Left lower leg: No edema.     Comments: Patient has good distal strength without clonus.  Positive slump test on the right  Skin:    Findings: No erythema or rash.  Neurological:     General: No focal deficit present.     Mental Status: He is alert and oriented to person, place, and time.     Sensory: No sensory deficit.     Motor: No weakness or abnormal muscle tone.     Coordination: Coordination normal.  Psychiatric:        Mood and Affect: Mood normal.        Behavior: Behavior normal.      Imaging: XR C-ARM NO REPORT  Result Date: 06/18/2020 Please see Notes tab for imaging impression.

## 2020-06-28 ENCOUNTER — Encounter: Payer: BC Managed Care – PPO | Admitting: Physical Medicine and Rehabilitation

## 2020-07-10 ENCOUNTER — Telehealth: Payer: Self-pay | Admitting: Family Medicine

## 2020-07-10 NOTE — Telephone Encounter (Signed)
Patient called requesting a call back. Patient stated he needed to see if Dr. Prince Rome staff received a fax on behalf of patient. Please call patient back at [phone].

## 2020-07-11 ENCOUNTER — Encounter: Payer: Self-pay | Admitting: Family Medicine

## 2020-07-11 ENCOUNTER — Telehealth: Payer: Self-pay | Admitting: Family Medicine

## 2020-07-11 NOTE — Telephone Encounter (Signed)
I faxed the note to Attn: NP Moses, @ [phone] at patients request.

## 2020-07-11 NOTE — Telephone Encounter (Signed)
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  Can I write this letter for him? Please advise

## 2020-07-11 NOTE — Telephone Encounter (Signed)
Pt states he's called twice to see about getting his gabapentin authorized and this is the third time and no one has reached out to him. Pt states he would just like to confirm that we received the fax and an estimate on when we'll be sending it back.  [phone]

## 2020-07-11 NOTE — Telephone Encounter (Signed)
Please see other message.

## 2020-07-11 NOTE — Telephone Encounter (Signed)
Letter printed.

## 2020-07-30 ENCOUNTER — Ambulatory Visit: Payer: BC Managed Care – PPO | Admitting: Emergency Medicine

## 2020-08-07 ENCOUNTER — Other Ambulatory Visit: Payer: Self-pay | Admitting: Emergency Medicine

## 2020-08-07 DIAGNOSIS — E1165 Type 2 diabetes mellitus with hyperglycemia: Secondary | ICD-10-CM

## 2020-08-07 NOTE — Telephone Encounter (Signed)
Requested Prescriptions  Pending Prescriptions Disp Refills  . glipiZIDE (GLUCOTROL) 5 MG tablet [Pharmacy Med Name: GLIPIZIDE 5 MG TABLET] 180 tablet 0    Sig: TAKE 1 TABLET (5 MG TOTAL) BY MOUTH 2 (TWO) TIMES DAILY BEFORE A MEAL.     Endocrinology:  Diabetes - Sulfonylureas Failed - 08/07/2020 12:05 AM      Failed - HBA1C is between 0 and 7.9 and within 180 days    Hemoglobin A1C  Date Value Ref Range Status  02/20/2020 9.9 (A) 4.0 - 5.6 % Final   Hgb A1c MFr Bld  Date Value Ref Range Status  05/28/2017 9.8 (H) 4.8 - 5.6 % Final    Comment:             Pre-diabetes: 5.7 - 6.4          Diabetes: >6.4          Glycemic control for adults with diabetes: <7.0          Passed - Valid encounter within last 6 months    Recent Outpatient Visits          3 months ago Sciatica, right side   Primary Care at [facility], [place], [place]   5 months ago Type 2 diabetes mellitus without complication, without long-term current use of insulin [facility])   [facility] at [facility], [place], NP   1 year ago Type 2 diabetes mellitus with hyperglycemia, without long-term current use of insulin [facility])   Primary Care at [facility], [place], MD   1 year ago Type 2 diabetes mellitus without complication, without long-term current use of insulin [facility])   Primary Care at [facility], [place], [place]   2 years ago Type 2 diabetes mellitus without complication, without long-term current use of insulin [facility])   [facility] Care at [facility], [place], MD

## 2020-08-08 ENCOUNTER — Other Ambulatory Visit: Payer: Self-pay | Admitting: Registered Nurse

## 2020-08-08 DIAGNOSIS — I10 Essential (primary) hypertension: Secondary | ICD-10-CM

## 2020-08-08 DIAGNOSIS — E119 Type 2 diabetes mellitus without complications: Secondary | ICD-10-CM

## 2020-10-10 ENCOUNTER — Emergency Department (HOSPITAL_COMMUNITY): Payer: BC Managed Care – PPO

## 2020-10-10 ENCOUNTER — Encounter (HOSPITAL_COMMUNITY): Payer: Self-pay

## 2020-10-10 ENCOUNTER — Emergency Department (HOSPITAL_COMMUNITY)
Admission: EM | Admit: 2020-10-10 | Discharge: 2020-10-10 | Disposition: A | Discharge status: Home / Self Care | Payer: BC Managed Care – PPO | Attending: Emergency Medicine | Admitting: Emergency Medicine

## 2020-10-10 ENCOUNTER — Other Ambulatory Visit: Payer: Self-pay

## 2020-10-10 DIAGNOSIS — E119 Type 2 diabetes mellitus without complications: Secondary | ICD-10-CM | POA: Insufficient documentation

## 2020-10-10 DIAGNOSIS — M25561 Pain in right knee: Secondary | ICD-10-CM | POA: Diagnosis not present

## 2020-10-10 DIAGNOSIS — R10811 Right upper quadrant abdominal tenderness: Secondary | ICD-10-CM | POA: Diagnosis not present

## 2020-10-10 DIAGNOSIS — Z79899 Other long term (current) drug therapy: Secondary | ICD-10-CM | POA: Insufficient documentation

## 2020-10-10 DIAGNOSIS — I10 Essential (primary) hypertension: Secondary | ICD-10-CM | POA: Diagnosis not present

## 2020-10-10 DIAGNOSIS — R000 Tachycardia, unspecified: Secondary | ICD-10-CM | POA: Diagnosis not present

## 2020-10-10 DIAGNOSIS — M5459 Other low back pain: Secondary | ICD-10-CM | POA: Diagnosis not present

## 2020-10-10 DIAGNOSIS — R109 Unspecified abdominal pain: Secondary | ICD-10-CM

## 2020-10-10 DIAGNOSIS — Z7984 Long term (current) use of oral hypoglycemic drugs: Secondary | ICD-10-CM | POA: Diagnosis not present

## 2020-10-10 DIAGNOSIS — R935 Abnormal findings on diagnostic imaging of other abdominal regions, including retroperitoneum: Secondary | ICD-10-CM | POA: Diagnosis not present

## 2020-10-10 DIAGNOSIS — M7989 Other specified soft tissue disorders: Secondary | ICD-10-CM | POA: Diagnosis not present

## 2020-10-10 DIAGNOSIS — M545 Low back pain, unspecified: Secondary | ICD-10-CM | POA: Insufficient documentation

## 2020-10-10 DIAGNOSIS — M778 Other enthesopathies, not elsewhere classified: Secondary | ICD-10-CM

## 2020-10-10 DIAGNOSIS — M25562 Pain in left knee: Secondary | ICD-10-CM | POA: Insufficient documentation

## 2020-10-10 DIAGNOSIS — F17200 Nicotine dependence, unspecified, uncomplicated: Secondary | ICD-10-CM | POA: Insufficient documentation

## 2020-10-10 DIAGNOSIS — Y92410 Unspecified street and highway as the place of occurrence of the external cause: Secondary | ICD-10-CM | POA: Insufficient documentation

## 2020-10-10 DIAGNOSIS — Z041 Encounter for examination and observation following transport accident: Secondary | ICD-10-CM | POA: Diagnosis not present

## 2020-10-10 DIAGNOSIS — R1031 Right lower quadrant pain: Secondary | ICD-10-CM | POA: Diagnosis not present

## 2020-10-10 LAB — COMPREHENSIVE METABOLIC PANEL
ALT: 23 U/L (ref 0–44)
AST: 38 U/L (ref 15–41)
Albumin: 3.7 g/dL (ref 3.5–5.0)
Alkaline Phosphatase: 61 U/L (ref 38–126)
Anion gap: 15 (ref 5–15)
BUN: 21 mg/dL — ABNORMAL HIGH (ref 6–20)
CO2: 21 mmol/L — ABNORMAL LOW (ref 22–32)
Calcium: 9.3 mg/dL (ref 8.9–10.3)
Chloride: 100 mmol/L (ref 98–111)
Creatinine, Ser: 1.5 mg/dL — ABNORMAL HIGH (ref 0.61–1.24)
GFR, Estimated: 57 mL/min — ABNORMAL LOW (ref >60)
Glucose, Bld: 333 mg/dL — ABNORMAL HIGH (ref 70–99)
Potassium: 4.9 mmol/L (ref 3.5–5.1)
Sodium: 136 mmol/L (ref 135–145)
Total Bilirubin: 1 mg/dL (ref 0.3–1.2)
Total Protein: 6.8 g/dL (ref 6.5–8.1)

## 2020-10-10 LAB — URINALYSIS, ROUTINE W REFLEX MICROSCOPIC
Bacteria, UA: NONE SEEN
Bilirubin Urine: NEGATIVE
Glucose, UA: >=500 mg/dL — AB
Hgb urine dipstick: NEGATIVE
Ketones, ur: NEGATIVE mg/dL
Leukocytes,Ua: NEGATIVE
Nitrite: NEGATIVE
Protein, ur: NEGATIVE mg/dL
Specific Gravity, Urine: >1.046 — ABNORMAL HIGH (ref 1.005–1.030)
pH: 5 (ref 5.0–8.0)

## 2020-10-10 LAB — CBC WITH DIFFERENTIAL/PLATELET
Abs Immature Granulocytes: 0.02 10*3/uL (ref 0.00–0.07)
Basophils Absolute: 0 10*3/uL (ref 0.0–0.1)
Basophils Relative: 0 %
Eosinophils Absolute: 0 10*3/uL (ref 0.0–0.5)
Eosinophils Relative: 1 %
HCT: 39 % (ref 39.0–52.0)
Hemoglobin: 12.6 g/dL — ABNORMAL LOW (ref 13.0–17.0)
Immature Granulocytes: 0 %
Lymphocytes Relative: 28 %
Lymphs Abs: 2.1 10*3/uL (ref 0.7–4.0)
MCH: 28.4 pg (ref 26.0–34.0)
MCHC: 32.3 g/dL (ref 30.0–36.0)
MCV: 88 fL (ref 80.0–100.0)
Monocytes Absolute: 0.6 10*3/uL (ref 0.1–1.0)
Monocytes Relative: 8 %
Neutro Abs: 4.9 10*3/uL (ref 1.7–7.7)
Neutrophils Relative %: 63 %
Platelets: 245 10*3/uL (ref 150–400)
RBC: 4.43 MIL/uL (ref 4.22–5.81)
RDW: 13.1 % (ref 11.5–15.5)
WBC: 7.6 10*3/uL (ref 4.0–10.5)
nRBC: 0 % (ref 0.0–0.2)

## 2020-10-10 MED ORDER — FENTANYL CITRATE (PF) 100 MCG/2ML IJ SOLN
50.0000 ug | Freq: Once | INTRAMUSCULAR | Status: AC
  Administered 2020-10-10: 50 ug via INTRAVENOUS
  Filled 2020-10-10: qty 2

## 2020-10-10 MED ORDER — SODIUM CHLORIDE 0.9 % IV BOLUS
500.0000 mL | Freq: Once | INTRAVENOUS | Status: AC
  Administered 2020-10-10: 500 mL via INTRAVENOUS

## 2020-10-10 MED ORDER — IOHEXOL 300 MG/ML  SOLN
100.0000 mL | Freq: Once | INTRAMUSCULAR | Status: AC | PRN
  Administered 2020-10-10: 100 mL via INTRAVENOUS

## 2020-10-10 MED ORDER — METHOCARBAMOL 500 MG PO TABS: 500.0000 mg | ORAL_TABLET | Freq: Three times a day (TID) | ORAL | 0 refills | Status: DC | PRN

## 2020-10-10 MED ORDER — HYDROCODONE-ACETAMINOPHEN 5-325 MG PO TABS: 1.0000 | ORAL_TABLET | Freq: Three times a day (TID) | ORAL | 0 refills | Status: DC | PRN

## 2020-10-10 MED ORDER — ONDANSETRON HCL 4 MG/2ML IJ SOLN
4.0000 mg | Freq: Once | INTRAMUSCULAR | Status: AC
  Administered 2020-10-10: 4 mg via INTRAVENOUS
  Filled 2020-10-10: qty 2
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
## 2020-10-10 NOTE — ED Notes (Signed)
Pt transported to XRAY °

## 2020-10-10 NOTE — ED Notes (Signed)
Pt returned from CT °

## 2020-10-10 NOTE — Discharge Instructions (Addendum)
Your imaging and evaluation today was overall reassuring.  No evidence of intra-abdominal injury, no fractures to the knees or lumbar spine.  We do see some evidence of chronic degenerative changes to your lumbar spine that are starting to cause some spinal canal stenosis.  Please follow-up closely with [facility] regarding this.   To treat pain you may use Norco once every 8 hours for the first 2 days, then you may use 1000 mg of Tylenol every 6 hours as needed as well as prescribed Robaxin to help with muscle spasm and soreness.  Your kidney function was a bit elevated today so please avoid medications like ibuprofen, aspirin or Aleve for pain as these can worsen kidney function.   You may also use ice and heat, and over-the-counter remedies such as Biofreeze gel or salon pas lidocaine patches. The muscle soreness should improve over the next week. Follow up with your family doctor in the next week for a recheck if you are still having symptoms. Return to ED if pain is worsening, you develop weakness or numbness of extremities, or new or concerning symptoms develop.

## 2020-10-10 NOTE — ED Notes (Signed)
Patient verbalizes understanding of discharge instructions. Opportunity for questioning and answers were provided. Armband removed by staff, pt discharged from ED ambulatory to home.  

## 2020-12-02 ENCOUNTER — Other Ambulatory Visit: Payer: Self-pay | Admitting: Emergency Medicine

## 2020-12-02 DIAGNOSIS — I10 Essential (primary) hypertension: Secondary | ICD-10-CM

## 2020-12-02 DIAGNOSIS — E119 Type 2 diabetes mellitus without complications: Secondary | ICD-10-CM

## 2020-12-24 ENCOUNTER — Ambulatory Visit: Payer: BC Managed Care – PPO | Admitting: Emergency Medicine

## 2020-12-24 ENCOUNTER — Other Ambulatory Visit: Payer: Self-pay

## 2020-12-24 ENCOUNTER — Encounter: Payer: Self-pay | Admitting: Emergency Medicine

## 2020-12-24 VITALS — BP 130/80 | HR 97 | Temp 98.1°F | Resp 16 | Ht 70.0 in | Wt 230.0 lb

## 2020-12-24 DIAGNOSIS — I10 Essential (primary) hypertension: Secondary | ICD-10-CM | POA: Diagnosis not present

## 2020-12-24 DIAGNOSIS — Z23 Encounter for immunization: Secondary | ICD-10-CM

## 2020-12-24 DIAGNOSIS — Z1211 Encounter for screening for malignant neoplasm of colon: Secondary | ICD-10-CM

## 2020-12-24 DIAGNOSIS — E1159 Type 2 diabetes mellitus with other circulatory complications: Secondary | ICD-10-CM | POA: Diagnosis not present

## 2020-12-24 DIAGNOSIS — I152 Hypertension secondary to endocrine disorders: Secondary | ICD-10-CM | POA: Diagnosis not present

## 2020-12-24 DIAGNOSIS — E1165 Type 2 diabetes mellitus with hyperglycemia: Secondary | ICD-10-CM | POA: Diagnosis not present

## 2020-12-24 DIAGNOSIS — Z789 Other specified health status: Secondary | ICD-10-CM | POA: Diagnosis not present

## 2020-12-24 LAB — GLUCOSE, POCT (MANUAL RESULT ENTRY): POC Glucose: 377 mg/dl — AB (ref 70–99)

## 2020-12-24 LAB — POCT GLYCOSYLATED HEMOGLOBIN (HGB A1C): Hemoglobin A1C: 13.4 % — AB (ref 4.0–5.6)

## 2020-12-24 MED ORDER — FREESTYLE LIBRE 14 DAY SENSOR MISC: 3 refills | Status: DC

## 2020-12-24 MED ORDER — LISINOPRIL 20 MG PO TABS: 20.0000 mg | ORAL_TABLET | Freq: Every day | ORAL | 3 refills | Status: DC

## 2020-12-24 MED ORDER — METFORMIN HCL 1000 MG PO TABS: 1000.0000 mg | ORAL_TABLET | Freq: Two times a day (BID) | ORAL | 3 refills | Status: DC

## 2020-12-24 MED ORDER — TRULICITY 0.75 MG/0.5ML ~~LOC~~ SOAJ: 0.7500 mg | SUBCUTANEOUS | 5 refills | Status: DC

## 2020-12-24 MED ORDER — FREESTYLE LIBRE 14 DAY READER DEVI: 3 refills | Status: DC

## 2020-12-24 MED ORDER — AMLODIPINE BESYLATE 10 MG PO TABS: 10.0000 mg | ORAL_TABLET | Freq: Every day | ORAL | 3 refills | Status: DC

## 2020-12-24 MED ORDER — GLIPIZIDE 5 MG PO TABS: 5.0000 mg | ORAL_TABLET | Freq: Two times a day (BID) | ORAL | 0 refills | Status: DC

## 2020-12-24 NOTE — Assessment & Plan Note (Signed)
Uncontrolled hypertension.  Continue amlodipine 10 mg and lisinopril 20 mg daily. Uncontrolled diabetes with hemoglobin A1c of 13.4.  Patient noncompliant with both medications and nutrition. Restart Metformin 1000 mg twice a day and glipizide 5 mg twice a day. Start Trulicity 0.75 mg weekly.  Demonstration pen used with patient. Diet and nutrition discussed. Follow-up in 3 months. Continue rosuvastatin 5 mg daily.

## 2020-12-24 NOTE — Patient Instructions (Addendum)
   If you have lab work done today you will be contacted with your lab results within the next 2 weeks.  If you have not heard from us then please contact us. The fastest way to get your results is to register for My Chart.   IF you received an x-ray today, you will receive an invoice from [facility]. Please contact [facility] at [phone] with questions or concerns regarding your invoice.   IF you received labwork today, you will receive an invoice from [facility]. Please contact [facility] at [phone] with questions or concerns regarding your invoice.   Our billing staff will not be able to assist you with questions regarding bills from these companies.  You will be contacted with the lab results as soon as they are available. The fastest way to get your results is to activate your My Chart account. Instructions are located on the last page of this paperwork. If you have not heard from us regarding the results in 2 weeks, please contact this office.     Diabetes Mellitus and Nutrition, Adult When you have diabetes, or diabetes mellitus, it is very important to have healthy eating habits because your blood sugar (glucose) levels are greatly affected by what you eat and drink. Eating healthy foods in the right amounts, at about the same times every day, can help you:  Control your blood glucose.  Lower your risk of heart disease.  Improve your blood pressure.  Reach or maintain a healthy weight. What can affect my meal plan? Every person with diabetes is different, and each person has different needs for a meal plan. Your health care provider may recommend that you work with a dietitian to make a meal plan that is best for you. Your meal plan may vary depending on factors such as:  The calories you need.  The medicines you take.  Your weight.  Your blood glucose, blood pressure, and cholesterol levels.  Your activity level.  Other health conditions you  have, such as heart or kidney disease. How do carbohydrates affect me? Carbohydrates, also called carbs, affect your blood glucose level more than any other type of food. Eating carbs naturally raises the amount of glucose in your blood. Carb counting is a method for keeping track of how many carbs you eat. Counting carbs is important to keep your blood glucose at a healthy level, especially if you use insulin or take certain oral diabetes medicines. It is important to know how many carbs you can safely have in each meal. This is different for every person. Your dietitian can help you calculate how many carbs you should have at each meal and for each snack. How does alcohol affect me? Alcohol can cause a sudden decrease in blood glucose (hypoglycemia), especially if you use insulin or take certain oral diabetes medicines. Hypoglycemia can be a life-threatening condition. Symptoms of hypoglycemia, such as sleepiness, dizziness, and confusion, are similar to symptoms of having too much alcohol.  Do not drink alcohol if: ? Your health care provider tells you not to drink. ? You are pregnant, may be pregnant, or are planning to become pregnant.  If you drink alcohol: ? Do not drink on an empty stomach. ? Limit how much you use to:  0-1 drink a day for women.  0-2 drinks a day for men. ? Be aware of how much alcohol is in your drink. In the [place] one drink equals one 12 oz bottle of beer (355 mL),   one 5 oz glass of wine (148 mL), or one 1 oz glass of hard liquor (44 mL). ? Keep yourself hydrated with water, diet soda, or unsweetened iced tea.  Keep in mind that regular soda, juice, and other mixers may contain a lot of sugar and must be counted as carbs. What are tips for following this plan? Reading food labels  Start by checking the serving size on the "Nutrition Facts" label of packaged foods and drinks. The amount of calories, carbs, fats, and other nutrients listed on the label is based on  one serving of the item. Many items contain more than one serving per package.  Check the total grams (g) of carbs in one serving. You can calculate the number of servings of carbs in one serving by dividing the total carbs by 15. For example, if a food has 30 g of total carbs per serving, it would be equal to 2 servings of carbs.  Check the number of grams (g) of saturated fats and trans fats in one serving. Choose foods that have a low amount or none of these fats.  Check the number of milligrams (mg) of salt (sodium) in one serving. Most people should limit total sodium intake to less than 2,300 mg per day.  Always check the nutrition information of foods labeled as "low-fat" or "nonfat." These foods may be higher in added sugar or refined carbs and should be avoided.  Talk to your dietitian to identify your daily goals for nutrients listed on the label. Shopping  Avoid buying canned, pre-made, or processed foods. These foods tend to be high in fat, sodium, and added sugar.  Shop around the outside edge of the grocery store. This is where you will most often find fresh fruits and vegetables, bulk grains, fresh meats, and fresh dairy. Cooking  Use low-heat cooking methods, such as baking, instead of high-heat cooking methods like deep frying.  Cook using healthy oils, such as olive, canola, or sunflower oil.  Avoid cooking with butter, cream, or high-fat meats. Meal planning  Eat meals and snacks regularly, preferably at the same times every day. Avoid going long periods of time without eating.  Eat foods that are high in fiber, such as fresh fruits, vegetables, beans, and whole grains. Talk with your dietitian about how many servings of carbs you can eat at each meal.  Eat 4-6 oz (112-168 g) of lean protein each day, such as lean meat, chicken, fish, eggs, or tofu. One ounce (oz) of lean protein is equal to: ? 1 oz (28 g) of meat, chicken, or fish. ? 1 egg. ?  cup (62 g) of  tofu.  Eat some foods each day that contain healthy fats, such as avocado, nuts, seeds, and fish.   What foods should I eat? Fruits Berries. Apples. Oranges. Peaches. Apricots. Plums. Grapes. Mango. Papaya. Pomegranate. Kiwi. Cherries. Vegetables Lettuce. Spinach. Leafy greens, including kale, chard, collard greens, and mustard greens. Beets. Cauliflower. Cabbage. Broccoli. Carrots. [place] beans. Tomatoes. Peppers. Onions. Cucumbers. Brussels sprouts. Grains Whole grains, such as whole-wheat or whole-grain bread, crackers, tortillas, cereal, and pasta. Unsweetened oatmeal. Quinoa. Brown or wild rice. Meats and other proteins Seafood. Poultry without skin. Lean cuts of poultry and beef. Tofu. Nuts. Seeds. Dairy Low-fat or fat-free dairy products such as milk, yogurt, and cheese. The items listed above may not be a complete list of foods and beverages you can eat. Contact a dietitian for more information. What foods should I avoid? Fruits Fruits canned   with syrup. Vegetables Canned vegetables. Frozen vegetables with butter or cream sauce. Grains Refined white flour and flour products such as bread, pasta, snack foods, and cereals. Avoid all processed foods. Meats and other proteins Fatty cuts of meat. Poultry with skin. Breaded or fried meats. Processed meat. Avoid saturated fats. Dairy Full-fat yogurt, cheese, or milk. Beverages Sweetened drinks, such as soda or iced tea. The items listed above may not be a complete list of foods and beverages you should avoid. Contact a dietitian for more information. Questions to ask a health care provider  Do I need to meet with a diabetes educator?  Do I need to meet with a dietitian?  What number can I call if I have questions?  When are the best times to check my blood glucose? Where to find more information:  American Diabetes Association: diabetes.org  Academy of Nutrition and Dietetics: www.eatright.org  National Institute of  Diabetes and Digestive and Kidney Diseases: www.niddk.nih.gov  Association of Diabetes Care and Education Specialists: www.diabeteseducator.org Summary  It is important to have healthy eating habits because your blood sugar (glucose) levels are greatly affected by what you eat and drink.  A healthy meal plan will help you control your blood glucose and maintain a healthy lifestyle.  Your health care provider may recommend that you work with a dietitian to make a meal plan that is best for you.  Keep in mind that carbohydrates (carbs) and alcohol have immediate effects on your blood glucose levels. It is important to count carbs and to use alcohol carefully. This information is not intended to replace advice given to you by your health care provider. Make sure you discuss any questions you have with your health care provider. Document Revised: 10/11/2019 Document Reviewed: 10/11/2019 [employer]vier Patient Education  2021 [employer]  

## 2020-12-24 NOTE — Progress Notes (Signed)
Paul Roberson 50 y.o.   Chief Complaint  Patient presents with  . Diabetes    Follow up   . Medication Refill    Pending    HISTORY OF PRESENT ILLNESS: This is a 50 y.o. male with history of diabetes and hypertension here for follow-up and medication refill. Noncompliant with both diet and medications. Not vaccinated against Covid.  HPI   Prior to Admission medications   Medication Sig Start Date End Date Taking? Authorizing Provider  glipiZIDE (GLUCOTROL) 5 MG tablet TAKE 1 TABLET (5 MG TOTAL) BY MOUTH 2 (TWO) TIMES DAILY BEFORE A MEAL. 08/07/20  Yes Rose Hegner, Eilleen Kempf, MD  JANUMET 50-1000 MG tablet TAKE 1 TABLET BY MOUTH 2 (TWO) TIMES DAILY WITH A MEAL. Patient taking differently: Take 1 tablet by mouth 2 (two) times daily with a meal. 05/22/20  Yes Janeece Agee, NP  rosuvastatin (CRESTOR) 5 MG tablet Take 1 tablet (5 mg total) by mouth daily. Patient taking differently: Take 5 mg by mouth at bedtime. 02/20/20  Yes Janeece Agee, NP  amLODipine (NORVASC) 10 MG tablet Take 1 tablet (10 mg total) by mouth daily. 12/24/20   Georgina Quint, MD  gabapentin (NEURONTIN) 100 MG capsule 1 PO q HS, may increase to 1 PO TID if needed Patient not taking: Reported on 12/24/2020 04/24/20   Hilts, Casimiro Needle, MD  HYDROcodone-acetaminophen (NORCO) 5-325 MG tablet Take 1 tablet by mouth every 8 (eight) hours as needed. Patient not taking: Reported on 12/24/2020 10/10/20   Dartha Lodge, PA-C  lisinopril (ZESTRIL) 20 MG tablet Take 1 tablet (20 mg total) by mouth daily. 12/24/20   Georgina Quint, MD  meloxicam (MOBIC) 15 MG tablet Take 1 tablet (15 mg total) by mouth daily. Patient not taking: Reported on 12/24/2020 03/13/20   Georgina Quint, MD  methocarbamol (ROBAXIN) 500 MG tablet Take 1 tablet (500 mg total) by mouth every 8 (eight) hours as needed for muscle spasms. Patient not taking: Reported on 12/24/2020 10/10/20   Dartha Lodge, PA-C    Allergies  Allergen Reactions  .  Penicillins Itching  . Sulfa Antibiotics Other (See Comments)    Causes real bad headache    Patient Active Problem List   Diagnosis Date Noted  . Hypertension associated with type 2 diabetes mellitus (HCC) 05/28/2017  . Type 2 diabetes mellitus without complication, without long-term current use of insulin (HCC) 05/28/2017    Past Medical History:  Diagnosis Date  . Diabetes mellitus without complication (HCC)   . Hypertension     History reviewed. No pertinent surgical history.  Social History   Socioeconomic History  . Marital status: Married    Spouse name: Not on file  . Number of children: Not on file  . Years of education: Not on file  . Highest education level: Not on file  Occupational History  . Not on file  Tobacco Use  . Smoking status: Current Some Day Smoker  . Smokeless tobacco: Never Used  Vaping Use  . Vaping Use: Never used  Substance and Sexual Activity  . Alcohol use: Yes  . Drug use: No  . Sexual activity: Yes  Other Topics Concern  . Not on file  Social History Narrative  . Not on file   Social Determinants of Health   Financial Resource Strain: Not on file  Food Insecurity: Not on file  Transportation Needs: Not on file  Physical Activity: Not on file  Stress: Not on file  Social Connections: Not  on file  Intimate Partner Violence: Not on file    Family History  Problem Relation Age of Onset  . Diabetes Mother   . Heart disease Mother   . Hypertension Mother   . Asthma Mother   . Diabetes Father   . Heart disease Father   . Hypertension Father   . Kidney disease Father   . Cancer Father      Review of Systems  Constitutional: Negative.  Negative for chills and fever.  HENT: Negative.  Negative for congestion and sore throat.   Respiratory: Negative.  Negative for cough and shortness of breath.   Cardiovascular: Negative.  Negative for chest pain and palpitations.  Gastrointestinal: Negative.  Negative for abdominal pain,  diarrhea, nausea and vomiting.  Genitourinary: Negative.  Negative for dysuria and hematuria.  Skin: Negative.  Negative for rash.  Neurological: Negative.  Negative for dizziness and headaches.  All other systems reviewed and are negative.  Vitals:   12/24/20 1430  BP: (!) 143/84  Pulse: 97  Resp: 16  Temp: 98.1 F (36.7 C)  SpO2: 97%   Wt Readings from Last 3 Encounters:  12/24/20 230 lb (104.3 kg)  04/23/20 216 lb (98 kg)  02/20/20 228 lb (103.4 kg)     Physical Exam Vitals reviewed.  Constitutional:      Appearance: Normal appearance.  HENT:     Head: Normocephalic.  Eyes:     Extraocular Movements: Extraocular movements intact.     Pupils: Pupils are equal, round, and reactive to light.  Cardiovascular:     Rate and Rhythm: Normal rate and regular rhythm.     Pulses: Normal pulses.     Heart sounds: Normal heart sounds.  Pulmonary:     Effort: Pulmonary effort is normal.     Breath sounds: Normal breath sounds.  Musculoskeletal:        General: Normal range of motion.     Cervical back: Normal range of motion and neck supple.  Skin:    General: Skin is warm and dry.     Capillary Refill: Capillary refill takes less than 2 seconds.  Neurological:     General: No focal deficit present.     Mental Status: He is alert and oriented to person, place, and time.  Psychiatric:        Mood and Affect: Mood normal.        Behavior: Behavior normal.    Results for orders placed or performed in visit on 12/24/20 (from the past 24 hour(s))  POCT glucose (manual entry)     Status: Abnormal   Collection Time: 12/24/20  3:11 PM  Result Value Ref Range   POC Glucose 377 (A) 70 - 99 mg/dl  POCT glycosylated hemoglobin (Hb A1C)     Status: Abnormal   Collection Time: 12/24/20  3:12 PM  Result Value Ref Range   Hemoglobin A1C 13.4 (A) 4.0 - 5.6 %   HbA1c POC (<> result, manual entry)     HbA1c, POC (prediabetic range)     HbA1c, POC (controlled diabetic range)     A  total of 45 minutes was spent with the patient, greater than 50% of which was in counseling/coordination of care regarding diabetes and cardiovascular risks associated with this condition, review of all medications and changes made, review of most recent office visit notes, review of most recent blood work results including today's hemoglobin A1c, education on nutrition, hypertension and dyslipidemia, prognosis, documentation and need for follow-up in  3 months.   ASSESSMENT & PLAN: Hypertension associated with type 2 diabetes mellitus (HCC) Uncontrolled hypertension.  Continue amlodipine 10 mg and lisinopril 20 mg daily. Uncontrolled diabetes with hemoglobin A1c of 13.4.  Patient noncompliant with both medications and nutrition. Restart Metformin 1000 mg twice a day and glipizide 5 mg twice a day. Start Trulicity 0.75 mg weekly.  Demonstration pen used with patient. Diet and nutrition discussed. Follow-up in 3 months. Continue rosuvastatin 5 mg daily.  Bosten was seen today for diabetes and medication refill.  Diagnoses and all orders for this visit:  Hypertension associated with type 2 diabetes mellitus (HCC) -     Comprehensive metabolic panel -     Lipid panel -     amLODipine (NORVASC) 10 MG tablet; Take 1 tablet (10 mg total) by mouth daily. -     lisinopril (ZESTRIL) 20 MG tablet; Take 1 tablet (20 mg total) by mouth daily.  Uncontrolled type 2 diabetes mellitus with hyperglycemia (HCC) -     amLODipine (NORVASC) 10 MG tablet; Take 1 tablet (10 mg total) by mouth daily. -     Dulaglutide (TRULICITY) 0.75 MG/0.5ML SOPN; Inject 0.75 mg into the skin once a week. -     metFORMIN (GLUCOPHAGE) 1000 MG tablet; Take 1 tablet (1,000 mg total) by mouth 2 (two) times daily with a meal. -     glipiZIDE (GLUCOTROL) 5 MG tablet; Take 1 tablet (5 mg total) by mouth 2 (two) times daily before a meal.  Essential hypertension -     amLODipine (NORVASC) 10 MG tablet; Take 1 tablet (10 mg total) by  mouth daily. -     lisinopril (ZESTRIL) 20 MG tablet; Take 1 tablet (20 mg total) by mouth daily.  Type 2 diabetes mellitus with hyperglycemia, without long-term current use of insulin (HCC) -     POCT glucose (manual entry) -     POCT glycosylated hemoglobin (Hb A1C) -     HM Diabetes Foot Exam  Screening for colon cancer -     Ambulatory referral to Gastroenterology  Need for prophylactic vaccination and inoculation against influenza  Unknown status of immunity to COVID-19 virus -     SAR CoV2 Serology (COVID 19)AB(IGG)IA  Other orders -     Flu Vaccine QUAD 36+ mos IM    Patient Instructions       If you have lab work done today you will be contacted with your lab results within the next 2 weeks.  If you have not heard from [place] then please contact us. The fastest way to get your results is to register for My Chart.   IF you received an x-ray today, you will receive an invoice from [facility]. Please contact [facility] at [phone] with questions or concerns regarding your invoice.   IF you received labwork today, you will receive an invoice from [facility]. Please contact [facility] at [phone] with questions or concerns regarding your invoice.   Our billing staff will not be able to assist you with questions regarding bills from these companies.  You will be contacted with the lab results as soon as they are available. The fastest way to get your results is to activate your My Chart account. Instructions are located on the last page of this paperwork. If you have not heard from [place] regarding the results in 2 weeks, please contact this office.     Diabetes Mellitus and Nutrition, Adult When you have diabetes, or diabetes mellitus, it is  very important to have healthy eating habits because your blood sugar (glucose) levels are greatly affected by what you eat and drink. Eating healthy foods in the right amounts, at about the same times every day, can  help you:  Control your blood glucose.  Lower your risk of heart disease.  Improve your blood pressure.  Reach or maintain a healthy weight. What can affect my meal plan? Every person with diabetes is different, and each person has different needs for a meal plan. Your health care provider may recommend that you work with a dietitian to make a meal plan that is best for you. Your meal plan may vary depending on factors such as:  The calories you need.  The medicines you take.  Your weight.  Your blood glucose, blood pressure, and cholesterol levels.  Your activity level.  Other health conditions you have, such as heart or kidney disease. How do carbohydrates affect me? Carbohydrates, also called carbs, affect your blood glucose level more than any other type of food. Eating carbs naturally raises the amount of glucose in your blood. Carb counting is a method for keeping track of how many carbs you eat. Counting carbs is important to keep your blood glucose at a healthy level, especially if you use insulin or take certain oral diabetes medicines. It is important to know how many carbs you can safely have in each meal. This is different for every person. Your dietitian can help you calculate how many carbs you should have at each meal and for each snack. How does alcohol affect me? Alcohol can cause a sudden decrease in blood glucose (hypoglycemia), especially if you use insulin or take certain oral diabetes medicines. Hypoglycemia can be a life-threatening condition. Symptoms of hypoglycemia, such as sleepiness, dizziness, and confusion, are similar to symptoms of having too much alcohol.  Do not drink alcohol if: ? Your health care provider tells you not to drink. ? You are pregnant, may be pregnant, or are planning to become pregnant.  If you drink alcohol: ? Do not drink on an empty stomach. ? Limit how much you use to:  0-1 drink a day for women.  0-2 drinks a day for  men. ? Be aware of how much alcohol is in your drink. In the [place] one drink equals one 12 oz bottle of beer (355 mL), one 5 oz glass of wine (148 mL), or one 1 oz glass of hard liquor (44 mL). ? Keep yourself hydrated with water, diet soda, or unsweetened iced tea.  Keep in mind that regular soda, juice, and other mixers may contain a lot of sugar and must be counted as carbs. What are tips for following this plan? Reading food labels  Start by checking the serving size on the "Nutrition Facts" label of packaged foods and drinks. The amount of calories, carbs, fats, and other nutrients listed on the label is based on one serving of the item. Many items contain more than one serving per package.  Check the total grams (g) of carbs in one serving. You can calculate the number of servings of carbs in one serving by dividing the total carbs by 15. For example, if a food has 30 g of total carbs per serving, it would be equal to 2 servings of carbs.  Check the number of grams (g) of saturated fats and trans fats in one serving. Choose foods that have a low amount or none of these fats.  Check the number of  milligrams (mg) of salt (sodium) in one serving. Most people should limit total sodium intake to less than 2,300 mg per day.  Always check the nutrition information of foods labeled as "low-fat" or "nonfat." These foods may be higher in added sugar or refined carbs and should be avoided.  Talk to your dietitian to identify your daily goals for nutrients listed on the label. Shopping  Avoid buying canned, pre-made, or processed foods. These foods tend to be high in fat, sodium, and added sugar.  Shop around the outside edge of the grocery store. This is where you will most often find fresh fruits and vegetables, bulk grains, fresh meats, and fresh dairy. Cooking  Use low-heat cooking methods, such as baking, instead of high-heat cooking methods like deep frying.  Cook using healthy oils,  such as olive, canola, or sunflower oil.  Avoid cooking with butter, cream, or high-fat meats. Meal planning  Eat meals and snacks regularly, preferably at the same times every day. Avoid going long periods of time without eating.  Eat foods that are high in fiber, such as fresh fruits, vegetables, beans, and whole grains. Talk with your dietitian about how many servings of carbs you can eat at each meal.  Eat 4-6 oz (112-168 g) of lean protein each day, such as lean meat, chicken, fish, eggs, or tofu. One ounce (oz) of lean protein is equal to: ? 1 oz (28 g) of meat, chicken, or fish. ? 1 egg. ?  cup (62 g) of tofu.  Eat some foods each day that contain healthy fats, such as avocado, nuts, seeds, and fish.   What foods should I eat? Fruits Berries. Apples. Oranges. Peaches. Apricots. Plums. Grapes. Mango. Papaya. Pomegranate. Kiwi. Cherries. Vegetables Lettuce. Spinach. Leafy greens, including kale, chard, collard greens, and mustard greens. Beets. Cauliflower. Cabbage. Broccoli. Carrots. Rosas beans. Tomatoes. Peppers. Onions. Cucumbers. Brussels sprouts. Grains Whole grains, such as whole-wheat or whole-grain bread, crackers, tortillas, cereal, and pasta. Unsweetened oatmeal. Quinoa. Brown or wild rice. Meats and other proteins Seafood. Poultry without skin. Lean cuts of poultry and beef. Tofu. Nuts. Seeds. Dairy Low-fat or fat-free dairy products such as milk, yogurt, and cheese. The items listed above may not be a complete list of foods and beverages you can eat. Contact a dietitian for more information. What foods should I avoid? Fruits Fruits canned with syrup. Vegetables Canned vegetables. Frozen vegetables with butter or cream sauce. Grains Refined white flour and flour products such as bread, pasta, snack foods, and cereals. Avoid all processed foods. Meats and other proteins Fatty cuts of meat. Poultry with skin. Breaded or fried meats. Processed meat. Avoid saturated  fats. Dairy Full-fat yogurt, cheese, or milk. Beverages Sweetened drinks, such as soda or iced tea. The items listed above may not be a complete list of foods and beverages you should avoid. Contact a dietitian for more information. Questions to ask a health care provider  Do I need to meet with a diabetes educator?  Do I need to meet with a dietitian?  What number can I call if I have questions?  When are the best times to check my blood glucose? Where to find more information:  American Diabetes Association: diabetes.org  Academy of Nutrition and Dietetics: www.eatright.[employer]  National Institute of Diabetes and Digestive and Kidney Diseases: CarFlippers.tnwww.niddk.nih.gov  Association of Diabetes Care and Education Specialists: www.diabeteseducator.org Summary  It is important to have healthy eating habits because your blood sugar (glucose) levels are greatly affected by what you  eat and drink.  A healthy meal plan will help you control your blood glucose and maintain a healthy lifestyle.  Your health care provider may recommend that you work with a dietitian to make a meal plan that is best for you.  Keep in mind that carbohydrates (carbs) and alcohol have immediate effects on your blood glucose levels. It is important to count carbs and to use alcohol carefully. This information is not intended to replace advice given to you by your health care provider. Make sure you discuss any questions you have with your health care provider. Document Revised: 10/11/2019 Document Reviewed: 10/11/2019 Elsevier Patient Education  2021 Elsevier Inc.      Edwina Barth, MD [facility]

## 2020-12-25 LAB — COMPREHENSIVE METABOLIC PANEL
ALT: 17 IU/L (ref 0–44)
AST: 18 IU/L (ref 0–40)
Albumin/Globulin Ratio: 1.6 (ref 1.2–2.2)
Albumin: 4.6 g/dL (ref 4.0–5.0)
Alkaline Phosphatase: 78 IU/L (ref 44–121)
BUN/Creatinine Ratio: 11 (ref 9–20)
BUN: 14 mg/dL (ref 6–24)
Bilirubin Total: 0.5 mg/dL (ref 0.0–1.2)
CO2: 19 mmol/L — ABNORMAL LOW (ref 20–29)
Calcium: 9.5 mg/dL (ref 8.7–10.2)
Chloride: 101 mmol/L (ref 96–106)
Creatinine, Ser: 1.27 mg/dL (ref 0.76–1.27)
GFR calc Af Amer: 77 mL/min/{1.73_m2} (ref >59)
GFR calc non Af Amer: 67 mL/min/{1.73_m2} (ref >59)
Globulin, Total: 2.8 g/dL (ref 1.5–4.5)
Glucose: 359 mg/dL — ABNORMAL HIGH (ref 65–99)
Potassium: 4.7 mmol/L (ref 3.5–5.2)
Sodium: 140 mmol/L (ref 134–144)
Total Protein: 7.4 g/dL (ref 6.0–8.5)

## 2020-12-25 LAB — LIPID PANEL
Chol/HDL Ratio: 4.2 ratio (ref 0.0–5.0)
Cholesterol, Total: 166 mg/dL (ref 100–199)
HDL: 40 mg/dL (ref >39)
LDL Chol Calc (NIH): 97 mg/dL (ref 0–99)
Triglycerides: 169 mg/dL — ABNORMAL HIGH (ref 0–149)
VLDL Cholesterol Cal: 29 mg/dL (ref 5–40)

## 2020-12-25 LAB — SAR COV2 SEROLOGY (COVID19)AB(IGG),IA
SARS-CoV-2 Semi-Quant IgG Ab: <13 AU/mL (ref <13.0)
SARS-CoV-2 Spike Ab Interp: NEGATIVE

## 2020-12-27 NOTE — Addendum Note (Signed)
Addended by: Georg Ruddle A on: 12/27/2020 01:13 PM   Modules accepted: Orders

## 2020-12-28 DIAGNOSIS — Z23 Encounter for immunization: Secondary | ICD-10-CM | POA: Diagnosis not present

## 2021-01-07 ENCOUNTER — Telehealth: Payer: Self-pay | Admitting: *Deleted

## 2021-01-07 NOTE — Telephone Encounter (Signed)
Faxed Health Data Fx form to [facility] with lab results requested. Confirmation at 3:55 pm.

## 2021-01-08 ENCOUNTER — Encounter: Payer: Self-pay | Admitting: Gastroenterology

## 2021-01-30 ENCOUNTER — Telehealth: Payer: Self-pay | Admitting: *Deleted

## 2021-01-30 NOTE — Telephone Encounter (Signed)
On 01/29/2021, faxed healthy data form completed with lab results to [facility]. Confirmation 4:30 pm.

## 2021-03-15 ENCOUNTER — Encounter: Payer: BC Managed Care – PPO | Admitting: Gastroenterology

## 2021-03-25 ENCOUNTER — Ambulatory Visit: Payer: Self-pay | Admitting: Emergency Medicine

## 2021-06-30 ENCOUNTER — Other Ambulatory Visit: Payer: Self-pay | Admitting: Emergency Medicine

## 2021-06-30 DIAGNOSIS — E1159 Type 2 diabetes mellitus with other circulatory complications: Secondary | ICD-10-CM

## 2021-06-30 DIAGNOSIS — I10 Essential (primary) hypertension: Secondary | ICD-10-CM

## 2021-07-11 ENCOUNTER — Encounter: Payer: Self-pay | Admitting: Emergency Medicine

## 2021-07-11 ENCOUNTER — Other Ambulatory Visit: Payer: Self-pay

## 2021-07-11 ENCOUNTER — Ambulatory Visit: Payer: BC Managed Care – PPO | Admitting: Emergency Medicine

## 2021-07-11 VITALS — BP 150/88 | HR 109 | Temp 98.2°F | Ht 70.0 in | Wt 226.0 lb

## 2021-07-11 DIAGNOSIS — I10 Essential (primary) hypertension: Secondary | ICD-10-CM

## 2021-07-11 DIAGNOSIS — E1159 Type 2 diabetes mellitus with other circulatory complications: Secondary | ICD-10-CM | POA: Diagnosis not present

## 2021-07-11 DIAGNOSIS — E1165 Type 2 diabetes mellitus with hyperglycemia: Secondary | ICD-10-CM | POA: Diagnosis not present

## 2021-07-11 DIAGNOSIS — I152 Hypertension secondary to endocrine disorders: Secondary | ICD-10-CM

## 2021-07-11 LAB — COMPREHENSIVE METABOLIC PANEL
ALT: 27 U/L (ref 0–53)
AST: 22 U/L (ref 0–37)
Albumin: 4.2 g/dL (ref 3.5–5.2)
Alkaline Phosphatase: 63 U/L (ref 39–117)
BUN: 15 mg/dL (ref 6–23)
CO2: 27 mEq/L (ref 19–32)
Calcium: 9.7 mg/dL (ref 8.4–10.5)
Chloride: 103 mEq/L (ref 96–112)
Creatinine, Ser: 1.37 mg/dL (ref 0.40–1.50)
GFR: 61.19 mL/min (ref >60.00)
Glucose, Bld: 231 mg/dL — ABNORMAL HIGH (ref 70–99)
Potassium: 4.4 mEq/L (ref 3.5–5.1)
Sodium: 139 mEq/L (ref 135–145)
Total Bilirubin: 0.4 mg/dL (ref 0.2–1.2)
Total Protein: 7.7 g/dL (ref 6.0–8.3)

## 2021-07-11 LAB — LIPID PANEL
Cholesterol: 152 mg/dL (ref 0–200)
HDL: 40.4 mg/dL (ref >39.00)
LDL Cholesterol: 82 mg/dL (ref 0–99)
NonHDL: 111.13
Total CHOL/HDL Ratio: 4
Triglycerides: 145 mg/dL (ref 0.0–149.0)
VLDL: 29 mg/dL (ref 0.0–40.0)

## 2021-07-11 LAB — POCT GLYCOSYLATED HEMOGLOBIN (HGB A1C): Hemoglobin A1C: 9.9 % — AB (ref 4.0–5.6)

## 2021-07-11 MED ORDER — ROSUVASTATIN CALCIUM 5 MG PO TABS: 5.0000 mg | ORAL_TABLET | Freq: Every day | ORAL | 3 refills | Status: DC

## 2021-07-11 MED ORDER — TRULICITY 1.5 MG/0.5ML ~~LOC~~ SOAJ: 1.5000 mg | SUBCUTANEOUS | 3 refills | Status: DC

## 2021-07-11 MED ORDER — LISINOPRIL 40 MG PO TABS: 40.0000 mg | ORAL_TABLET | Freq: Every day | ORAL | 3 refills | Status: DC

## 2021-07-11 NOTE — Assessment & Plan Note (Signed)
Elevated blood pressure readings.  Continue amlodipine 10 mg daily.  Will increase lisinopril to 40 mg daily. Uncontrolled diabetes with hemoglobin A1c of 9.9 but better than before. Diet and nutrition discussed. Continue glipizide 5 mg twice a day.  Decrease metformin to 500 mg twice a day due to GI side effects.  Increase Trulicity dose to 1.5 mg weekly. Follow-up in 3 months.

## 2021-07-11 NOTE — Progress Notes (Signed)
Paul Roberson 50 y.o.   Chief Complaint  Patient presents with   Hypertension    Diabetic follow up    HISTORY OF PRESENT ILLNESS: This is a 50 y.o. male with history of diabetes and hypertension here for follow-up. Ran out of Trulicity 2 weeks ago.  Stopped metformin due to side effects.  Has been taking glipizide 5 mg twice a day. States he is eating better. Hypertension on amlodipine 10 mg and lisinopril 20 mg daily. Last office visit 12/24/2020.  Assessment and plan as follows:  ASSESSMENT & PLAN: Hypertension associated with type 2 diabetes mellitus (HCC) Uncontrolled hypertension.  Continue amlodipine 10 mg and lisinopril 20 mg daily. Uncontrolled diabetes with hemoglobin A1c of 13.4.  Patient noncompliant with both medications and nutrition. Restart Metformin 1000 mg twice a day and glipizide 5 mg twice a day. Start Trulicity 0.75 mg weekly.  Demonstration pen used with patient. Diet and nutrition discussed. Follow-up in 3 months. Continue rosuvastatin 5 mg daily. Lab Results  Component Value Date   HGBA1C 13.4 (A) 12/24/2020    BP Readings from Last 3 Encounters:  12/24/20 130/80  10/10/20 133/71  06/18/20 123/78    Hypertension Pertinent negatives include no chest pain, headaches, palpitations or shortness of breath.    Prior to Admission medications   Medication Sig Start Date End Date Taking? Authorizing Provider  amLODipine (NORVASC) 10 MG tablet Take 1 tablet (10 mg total) by mouth daily. 12/24/20   Georgina Quint, MD  Continuous Blood Gluc Receiver (FREESTYLE LIBRE 14 DAY READER) DEVI Sig several times a day as needed 12/24/20   Georgina Quint, MD  Continuous Blood Gluc Sensor (FREESTYLE LIBRE 14 DAY SENSOR) MISC Sig several times a day as needed 12/24/20   Georgina Quint, MD  Dulaglutide (TRULICITY) 0.75 MG/0.5ML SOPN Inject 0.75 mg into the skin once a week. 12/24/20   Georgina Quint, MD  gabapentin (NEURONTIN) 100 MG capsule 1 PO q HS,  may increase to 1 PO TID if needed Patient not taking: Reported on 12/24/2020 04/24/20   Hilts, Casimiro Needle, MD  glipiZIDE (GLUCOTROL) 5 MG tablet Take 1 tablet (5 mg total) by mouth 2 (two) times daily before a meal. 12/24/20   Denette Hass, Eilleen Kempf, MD  HYDROcodone-acetaminophen (NORCO) 5-325 MG tablet Take 1 tablet by mouth every 8 (eight) hours as needed. Patient not taking: Reported on 12/24/2020 10/10/20   Dartha Lodge, PA-C  lisinopril (ZESTRIL) 20 MG tablet TAKE 1 TABLET BY MOUTH EVERY DAY 06/30/21   Georgina Quint, MD  meloxicam (MOBIC) 15 MG tablet Take 1 tablet (15 mg total) by mouth daily. Patient not taking: Reported on 12/24/2020 03/13/20   Georgina Quint, MD  metFORMIN (GLUCOPHAGE) 1000 MG tablet Take 1 tablet (1,000 mg total) by mouth 2 (two) times daily with a meal. 12/24/20   Mayetta Castleman, Eilleen Kempf, MD  methocarbamol (ROBAXIN) 500 MG tablet Take 1 tablet (500 mg total) by mouth every 8 (eight) hours as needed for muscle spasms. Patient not taking: Reported on 12/24/2020 10/10/20   Dartha Lodge, PA-C  rosuvastatin (CRESTOR) 5 MG tablet Take 1 tablet (5 mg total) by mouth daily. Patient taking differently: Take 5 mg by mouth at bedtime. 02/20/20   Janeece Agee, NP    Allergies  Allergen Reactions   Penicillins Itching   Sulfa Antibiotics Other (See Comments)    Causes real bad headache    Patient Active Problem List   Diagnosis Date Noted   Hypertension  associated with type 2 diabetes mellitus (HCC) 05/28/2017   Type 2 diabetes mellitus without complication, without long-term current use of insulin (HCC) 05/28/2017    Past Medical History:  Diagnosis Date   Diabetes mellitus without complication (HCC)    Hypertension     No past surgical history on file.  Social History   Socioeconomic History   Marital status: Married    Spouse name: Not on file   Number of children: Not on file   Years of education: Not on file   Highest education level: Not on file   Occupational History   Not on file  Tobacco Use   Smoking status: Some Days   Smokeless tobacco: Never  Vaping Use   Vaping Use: Never used  Substance and Sexual Activity   Alcohol use: Yes   Drug use: No   Sexual activity: Yes  Other Topics Concern   Not on file  Social History Narrative   Not on file   Social Determinants of Health   Financial Resource Strain: Not on file  Food Insecurity: Not on file  Transportation Needs: Not on file  Physical Activity: Not on file  Stress: Not on file  Social Connections: Not on file  Intimate Partner Violence: Not on file    Family History  Problem Relation Age of Onset   Diabetes Mother    Heart disease Mother    Hypertension Mother    Asthma Mother    Diabetes Father    Heart disease Father    Hypertension Father    Kidney disease Father    Cancer Father      Review of Systems  Constitutional: Negative.  Negative for chills and fever.  HENT: Negative.  Negative for congestion and sore throat.   Respiratory: Negative.  Negative for cough and shortness of breath.   Cardiovascular: Negative.  Negative for chest pain and palpitations.  Gastrointestinal:  Negative for abdominal pain, diarrhea, nausea and vomiting.  Genitourinary: Negative.   Skin: Negative.  Negative for rash.  Neurological: Negative.  Negative for dizziness and headaches.  All other systems reviewed and are negative.  Today's Vitals   07/11/21 1339  BP: (!) 150/88  Pulse: (!) 109  Temp: 98.2 F (36.8 C)  TempSrc: Oral  SpO2: 94%  Weight: 226 lb (102.5 kg)  Height:  (1.778 m)   Body mass index is 32.43 kg/m. Wt Readings from Last 3 Encounters:  07/11/21 226 lb (102.5 kg)  12/24/20 230 lb (104.3 kg)  04/23/20 216 lb (98 kg)    Physical Exam Vitals reviewed.  Constitutional:      Appearance: Normal appearance.  HENT:     Head: Normocephalic.  Eyes:     Extraocular Movements: Extraocular movements intact.     Pupils: Pupils are  equal, round, and reactive to light.  Cardiovascular:     Rate and Rhythm: Normal rate and regular rhythm.     Pulses: Normal pulses.     Heart sounds: Normal heart sounds.  Pulmonary:     Effort: Pulmonary effort is normal.     Breath sounds: Normal breath sounds.  Musculoskeletal:     Cervical back: Normal range of motion and neck supple.  Skin:    General: Skin is warm and dry.  Neurological:     General: No focal deficit present.     Mental Status: He is alert and oriented to person, place, and time.  Psychiatric:        Mood and  Affect: Mood normal.        Behavior: Behavior normal.    Results for orders placed or performed in visit on 07/11/21 (from the past 24 hour(s))  POCT glycosylated hemoglobin (Hb A1C)     Status: Abnormal   Collection Time: 07/11/21  2:03 PM  Result Value Ref Range   Hemoglobin A1C 9.9 (A) 4.0 - 5.6 %   HbA1c POC (<> result, manual entry)     HbA1c, POC (prediabetic range)     HbA1c, POC (controlled diabetic range)      ASSESSMENT & PLAN: Hypertension associated with type 2 diabetes mellitus (HCC) Elevated blood pressure readings.  Continue amlodipine 10 mg daily.  Will increase lisinopril to 40 mg daily. Uncontrolled diabetes with hemoglobin A1c of 9.9 but better than before. Diet and nutrition discussed. Continue glipizide 5 mg twice a day.  Decrease metformin to 500 mg twice a day due to GI side effects.  Increase Trulicity dose to 1.5 mg weekly. Follow-up in 3 months.  Paul Roberson was seen today for hypertension.  Diagnoses and all orders for this visit:  Hypertension associated with type 2 diabetes mellitus (HCC) -     POCT glycosylated hemoglobin (Hb A1C) -     Comprehensive metabolic panel -     Lipid panel -     lisinopril (ZESTRIL) 40 MG tablet; Take 1 tablet (40 mg total) by mouth daily. -     rosuvastatin (CRESTOR) 5 MG tablet; Take 1 tablet (5 mg total) by mouth at bedtime.  Uncontrolled type 2 diabetes mellitus with hyperglycemia  (HCC) -     Dulaglutide (TRULICITY) 1.5 MG/0.5ML SOPN; Inject 1.5 mg into the skin once a week.  Uncontrolled hypertension    Patient Instructions  Diabetes Mellitus and Nutrition, Adult When you have diabetes, or diabetes mellitus, it is very important to have healthy eating habits because your blood sugar (glucose) levels are greatly affected by what you eat and drink. Eating healthy foods in the right amounts, at about the same times every day, can help you: Control your blood glucose. Lower your risk of heart disease. Improve your blood pressure. Reach or maintain a healthy weight. What can affect my meal plan? Every person with diabetes is different, and each person has different needs for a meal plan. Your health care provider may recommend that you work with a dietitian to make a meal plan that is best for you. Your meal plan may vary depending on factors such as: The calories you need. The medicines you take. Your weight. Your blood glucose, blood pressure, and cholesterol levels. Your activity level. Other health conditions you have, such as heart or kidney disease. How do carbohydrates affect me? Carbohydrates, also called carbs, affect your blood glucose level more than any other type of food. Eating carbs naturally raises the amount of glucose in your blood. Carb counting is a method for keeping track of how many carbs you eat. Counting carbs is important to keep your blood glucose at a healthy level,especially if you use insulin or take certain oral diabetes medicines. It is important to know how many carbs you can safely have in each meal. This is different for every person. Your dietitian can help you calculate how manycarbs you should have at each meal and for each snack. How does alcohol affect me? Alcohol can cause a sudden decrease in blood glucose (hypoglycemia), especially if you use insulin or take certain oral diabetes medicines. Hypoglycemia can be a life-threatening  condition. Symptoms  of hypoglycemia, such as sleepiness, dizziness, and confusion, are similar to symptoms of having too much alcohol. Do not drink alcohol if: Your health care provider tells you not to drink. You are pregnant, may be pregnant, or are planning to become pregnant. If you drink alcohol: Do not drink on an empty stomach. Limit how much you use to: 0-1 drink a day for women. 0-2 drinks a day for men. Be aware of how much alcohol is in your drink. In the [place] one drink equals one 12 oz bottle of beer (355 mL), one 5 oz glass of wine (148 mL), or one 1 oz glass of hard liquor (44 mL). Keep yourself hydrated with water, diet soda, or unsweetened iced tea. Keep in mind that regular soda, juice, and other mixers may contain a lot of sugar and must be counted as carbs. What are tips for following this plan?  Reading food labels Start by checking the serving size on the "Nutrition Facts" label of packaged foods and drinks. The amount of calories, carbs, fats, and other nutrients listed on the label is based on one serving of the item. Many items contain more than one serving per package. Check the total grams (g) of carbs in one serving. You can calculate the number of servings of carbs in one serving by dividing the total carbs by 15. For example, if a food has 30 g of total carbs per serving, it would be equal to 2 servings of carbs. Check the number of grams (g) of saturated fats and trans fats in one serving. Choose foods that have a low amount or none of these fats. Check the number of milligrams (mg) of salt (sodium) in one serving. Most people should limit total sodium intake to less than 2,300 mg per day. Always check the nutrition information of foods labeled as "low-fat" or "nonfat." These foods may be higher in added sugar or refined carbs and should be avoided. Talk to your dietitian to identify your daily goals for nutrients listed on the label. Shopping Avoid buying  canned, pre-made, or processed foods. These foods tend to be high in fat, sodium, and added sugar. Shop around the outside edge of the grocery store. This is where you will most often find fresh fruits and vegetables, bulk grains, fresh meats, and fresh dairy. Cooking Use low-heat cooking methods, such as baking, instead of high-heat cooking methods like deep frying. Cook using healthy oils, such as olive, canola, or sunflower oil. Avoid cooking with butter, cream, or high-fat meats. Meal planning Eat meals and snacks regularly, preferably at the same times every day. Avoid going long periods of time without eating. Eat foods that are high in fiber, such as fresh fruits, vegetables, beans, and whole grains. Talk with your dietitian about how many servings of carbs you can eat at each meal. Eat 4-6 oz (112-168 g) of lean protein each day, such as lean meat, chicken, fish, eggs, or tofu. One ounce (oz) of lean protein is equal to: 1 oz (28 g) of meat, chicken, or fish. 1 egg.  cup (62 g) of tofu. Eat some foods each day that contain healthy fats, such as avocado, nuts, seeds, and fish. What foods should I eat? Fruits Berries. Apples. Oranges. Peaches. Apricots. Plums. Grapes. Mango. Papaya.Pomegranate. Kiwi. Cherries. Vegetables Lettuce. Spinach. Leafy greens, including kale, chard, collard greens, and mustard greens. Beets. Cauliflower. Cabbage. Broccoli. Carrots. [place] beans.Tomatoes. Peppers. Onions. Cucumbers. Brussels sprouts. Grains Whole grains, such as whole-wheat or whole-grain bread,  crackers, tortillas,cereal, and pasta. Unsweetened oatmeal. Quinoa. Brown or wild rice. Meats and other proteins Seafood. Poultry without skin. Lean cuts of poultry and beef. Tofu. Nuts. Seeds. Dairy Low-fat or fat-free dairy products such as milk, yogurt, and cheese. The items listed above may not be a complete list of foods and beverages you can eat. Contact a dietitian for more information. What  foods should I avoid? Fruits Fruits canned with syrup. Vegetables Canned vegetables. Frozen vegetables with butter or cream sauce. Grains Refined white flour and flour products such as bread, pasta, snack foods, andcereals. Avoid all processed foods. Meats and other proteins Fatty cuts of meat. Poultry with skin. Breaded or fried meats. Processed meat.Avoid saturated fats. Dairy Full-fat yogurt, cheese, or milk. Beverages Sweetened drinks, such as soda or iced tea. The items listed above may not be a complete list of foods and beverages you should avoid. Contact a dietitian for more information. Questions to ask a health care provider Do I need to meet with a diabetes educator? Do I need to meet with a dietitian? What number can I call if I have questions? When are the best times to check my blood glucose? Where to find more information: American Diabetes Association: diabetes.org Academy of Nutrition and Dietetics: www.eatright.[employer]org National Institute of Diabetes and Digestive and Kidney Diseases: CarFlippers.tnwww.niddk.nih.gov Association of Diabetes Care and Education Specialists: www.diabeteseducator.org Summary It is important to have healthy eating habits because your blood sugar (glucose) levels are greatly affected by what you eat and drink. A healthy meal plan will help you control your blood glucose and maintain a healthy lifestyle. Your health care provider may recommend that you work with a dietitian to make a meal plan that is best for you. Keep in mind that carbohydrates (carbs) and alcohol have immediate effects on your blood glucose levels. It is important to count carbs and to use alcohol carefully. This information is not intended to replace advice given to you by your health care provider. Make sure you discuss any questions you have with your healthcare provider. Document Revised: 10/11/2019 Document Reviewed: 10/11/2019 Elsevier Patient Education  2021 Elsevier  Inc.    Edwina BarthMiguel Missael Ferrari, MD [facility]

## 2021-07-11 NOTE — Patient Instructions (Signed)
Diabetes Mellitus and Nutrition, Adult When you have diabetes, or diabetes mellitus, it is very important to have healthy eating habits because your blood sugar (glucose) levels are greatly affected by what you eat and drink. Eating healthy foods in the right amounts, at about the same times every day, can help you:  Control your blood glucose.  Lower your risk of heart disease.  Improve your blood pressure.  Reach or maintain a healthy weight. What can affect my meal plan? Every person with diabetes is different, and each person has different needs for a meal plan. Your health care provider may recommend that you work with a dietitian to make a meal plan that is best for you. Your meal plan may vary depending on factors such as:  The calories you need.  The medicines you take.  Your weight.  Your blood glucose, blood pressure, and cholesterol levels.  Your activity level.  Other health conditions you have, such as heart or kidney disease. How do carbohydrates affect me? Carbohydrates, also called carbs, affect your blood glucose level more than any other type of food. Eating carbs naturally raises the amount of glucose in your blood. Carb counting is a method for keeping track of how many carbs you eat. Counting carbs is important to keep your blood glucose at a healthy level, especially if you use insulin or take certain oral diabetes medicines. It is important to know how many carbs you can safely have in each meal. This is different for every person. Your dietitian can help you calculate how many carbs you should have at each meal and for each snack. How does alcohol affect me? Alcohol can cause a sudden decrease in blood glucose (hypoglycemia), especially if you use insulin or take certain oral diabetes medicines. Hypoglycemia can be a life-threatening condition. Symptoms of hypoglycemia, such as sleepiness, dizziness, and confusion, are similar to symptoms of having too much  alcohol.  Do not drink alcohol if: ? Your health care provider tells you not to drink. ? You are pregnant, may be pregnant, or are planning to become pregnant.  If you drink alcohol: ? Do not drink on an empty stomach. ? Limit how much you use to:  0-1 drink a day for women.  0-2 drinks a day for men. ? Be aware of how much alcohol is in your drink. In the [place] one drink equals one 12 oz bottle of beer (355 mL), one 5 oz glass of wine (148 mL), or one 1 oz glass of hard liquor (44 mL). ? Keep yourself hydrated with water, diet soda, or unsweetened iced tea.  Keep in mind that regular soda, juice, and other mixers may contain a lot of sugar and must be counted as carbs. What are tips for following this plan? Reading food labels  Start by checking the serving size on the "Nutrition Facts" label of packaged foods and drinks. The amount of calories, carbs, fats, and other nutrients listed on the label is based on one serving of the item. Many items contain more than one serving per package.  Check the total grams (g) of carbs in one serving. You can calculate the number of servings of carbs in one serving by dividing the total carbs by 15. For example, if a food has 30 g of total carbs per serving, it would be equal to 2 servings of carbs.  Check the number of grams (g) of saturated fats and trans fats in one serving. Choose foods that have   a low amount or none of these fats.  Check the number of milligrams (mg) of salt (sodium) in one serving. Most people should limit total sodium intake to less than 2,300 mg per day.  Always check the nutrition information of foods labeled as "low-fat" or "nonfat." These foods may be higher in added sugar or refined carbs and should be avoided.  Talk to your dietitian to identify your daily goals for nutrients listed on the label. Shopping  Avoid buying canned, pre-made, or processed foods. These foods tend to be high in fat, sodium, and added  sugar.  Shop around the outside edge of the grocery store. This is where you will most often find fresh fruits and vegetables, bulk grains, fresh meats, and fresh dairy. Cooking  Use low-heat cooking methods, such as baking, instead of high-heat cooking methods like deep frying.  Cook using healthy oils, such as olive, canola, or sunflower oil.  Avoid cooking with butter, cream, or high-fat meats. Meal planning  Eat meals and snacks regularly, preferably at the same times every day. Avoid going long periods of time without eating.  Eat foods that are high in fiber, such as fresh fruits, vegetables, beans, and whole grains. Talk with your dietitian about how many servings of carbs you can eat at each meal.  Eat 4-6 oz (112-168 g) of lean protein each day, such as lean meat, chicken, fish, eggs, or tofu. One ounce (oz) of lean protein is equal to: ? 1 oz (28 g) of meat, chicken, or fish. ? 1 egg. ?  cup (62 g) of tofu.  Eat some foods each day that contain healthy fats, such as avocado, nuts, seeds, and fish.   What foods should I eat? Fruits Berries. Apples. Oranges. Peaches. Apricots. Plums. Grapes. Mango. Papaya. Pomegranate. Kiwi. Cherries. Vegetables Lettuce. Spinach. Leafy greens, including kale, chard, collard greens, and mustard greens. Beets. Cauliflower. Cabbage. Broccoli. Carrots. [place] beans. Tomatoes. Peppers. Onions. Cucumbers. Brussels sprouts. Grains Whole grains, such as whole-wheat or whole-grain bread, crackers, tortillas, cereal, and pasta. Unsweetened oatmeal. Quinoa. Brown or wild rice. Meats and other proteins Seafood. Poultry without skin. Lean cuts of poultry and beef. Tofu. Nuts. Seeds. Dairy Low-fat or fat-free dairy products such as milk, yogurt, and cheese. The items listed above may not be a complete list of foods and beverages you can eat. Contact a dietitian for more information. What foods should I avoid? Fruits Fruits canned with  syrup. Vegetables Canned vegetables. Frozen vegetables with butter or cream sauce. Grains Refined white flour and flour products such as bread, pasta, snack foods, and cereals. Avoid all processed foods. Meats and other proteins Fatty cuts of meat. Poultry with skin. Breaded or fried meats. Processed meat. Avoid saturated fats. Dairy Full-fat yogurt, cheese, or milk. Beverages Sweetened drinks, such as soda or iced tea. The items listed above may not be a complete list of foods and beverages you should avoid. Contact a dietitian for more information. Questions to ask a health care provider  Do I need to meet with a diabetes educator?  Do I need to meet with a dietitian?  What number can I call if I have questions?  When are the best times to check my blood glucose? Where to find more information:  American Diabetes Association: diabetes.org  Academy of Nutrition and Dietetics: www.eatright.org  National Institute of Diabetes and Digestive and Kidney Diseases: www.niddk.nih.gov  Association of Diabetes Care and Education Specialists: www.diabeteseducator.org Summary  It is important to have healthy eating   habits because your blood sugar (glucose) levels are greatly affected by what you eat and drink.  A healthy meal plan will help you control your blood glucose and maintain a healthy lifestyle.  Your health care provider may recommend that you work with a dietitian to make a meal plan that is best for you.  Keep in mind that carbohydrates (carbs) and alcohol have immediate effects on your blood glucose levels. It is important to count carbs and to use alcohol carefully. This information is not intended to replace advice given to you by your health care provider. Make sure you discuss any questions you have with your health care provider. Document Revised: 10/11/2019 Document Reviewed: 10/11/2019 Elsevier Patient Education  2021 Elsevier Inc.  

## 2021-10-15 ENCOUNTER — Ambulatory Visit: Payer: BC Managed Care – PPO | Admitting: Emergency Medicine
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
## 2022-03-12 ENCOUNTER — Ambulatory Visit (INDEPENDENT_AMBULATORY_CARE_PROVIDER_SITE_OTHER): Payer: BC Managed Care – PPO | Admitting: Emergency Medicine

## 2022-03-12 ENCOUNTER — Encounter: Payer: Self-pay | Admitting: Emergency Medicine

## 2022-03-12 VITALS — BP 142/98 | HR 111 | Temp 98.1°F | Ht 70.0 in | Wt 217.1 lb

## 2022-03-12 DIAGNOSIS — I152 Hypertension secondary to endocrine disorders: Secondary | ICD-10-CM

## 2022-03-12 DIAGNOSIS — E1165 Type 2 diabetes mellitus with hyperglycemia: Secondary | ICD-10-CM | POA: Diagnosis not present

## 2022-03-12 DIAGNOSIS — E1159 Type 2 diabetes mellitus with other circulatory complications: Secondary | ICD-10-CM

## 2022-03-12 DIAGNOSIS — I10 Essential (primary) hypertension: Secondary | ICD-10-CM

## 2022-03-12 LAB — COMPREHENSIVE METABOLIC PANEL
ALT: 22 U/L (ref 0–53)
AST: 24 U/L (ref 0–37)
Albumin: 4.5 g/dL (ref 3.5–5.2)
Alkaline Phosphatase: 66 U/L (ref 39–117)
BUN: 25 mg/dL — ABNORMAL HIGH (ref 6–23)
CO2: 29 mEq/L (ref 19–32)
Calcium: 10 mg/dL (ref 8.4–10.5)
Chloride: 102 mEq/L (ref 96–112)
Creatinine, Ser: 1.48 mg/dL (ref 0.40–1.50)
GFR: 55.51 mL/min — ABNORMAL LOW (ref >60.00)
Glucose, Bld: 188 mg/dL — ABNORMAL HIGH (ref 70–99)
Potassium: 4.8 mEq/L (ref 3.5–5.1)
Sodium: 137 mEq/L (ref 135–145)
Total Bilirubin: 0.6 mg/dL (ref 0.2–1.2)
Total Protein: 8 g/dL (ref 6.0–8.3)

## 2022-03-12 LAB — POCT GLYCOSYLATED HEMOGLOBIN (HGB A1C): Hemoglobin A1C: 8.7 % — AB (ref 4.0–5.6)

## 2022-03-12 LAB — LIPID PANEL
Cholesterol: 171 mg/dL (ref 0–200)
HDL: 47.4 mg/dL (ref >39.00)
LDL Cholesterol: 97 mg/dL (ref 0–99)
NonHDL: 123.14
Total CHOL/HDL Ratio: 4
Triglycerides: 132 mg/dL (ref 0.0–149.0)
VLDL: 26.4 mg/dL (ref 0.0–40.0)

## 2022-03-12 LAB — MICROALBUMIN / CREATININE URINE RATIO
Creatinine,U: 233.9 mg/dL
Microalb Creat Ratio: 14.4 mg/g (ref 0.0–30.0)
Microalb, Ur: 33.8 mg/dL — ABNORMAL HIGH (ref 0.0–1.9)

## 2022-03-12 MED ORDER — TRULICITY 1.5 MG/0.5ML ~~LOC~~ SOAJ: 1.5000 mg | SUBCUTANEOUS | 3 refills | Status: DC

## 2022-03-12 MED ORDER — METFORMIN HCL 1000 MG PO TABS: 1000.0000 mg | ORAL_TABLET | Freq: Two times a day (BID) | ORAL | 3 refills | Status: DC

## 2022-03-12 MED ORDER — GLIPIZIDE 5 MG PO TABS: 5.0000 mg | ORAL_TABLET | Freq: Two times a day (BID) | ORAL | 0 refills | Status: DC

## 2022-03-12 MED ORDER — FREESTYLE LIBRE 14 DAY READER DEVI: 3 refills | Status: DC

## 2022-03-12 MED ORDER — AMLODIPINE BESYLATE 10 MG PO TABS: 10.0000 mg | ORAL_TABLET | Freq: Every day | ORAL | 3 refills | Status: DC

## 2022-03-12 MED ORDER — FREESTYLE LIBRE 14 DAY SENSOR MISC: 3 refills | Status: DC

## 2022-03-12 MED ORDER — LISINOPRIL 40 MG PO TABS: 40.0000 mg | ORAL_TABLET | Freq: Every day | ORAL | 3 refills | Status: DC

## 2022-03-12 NOTE — Patient Instructions (Signed)

## 2022-03-12 NOTE — Progress Notes (Signed)
Paul Roberson ?51 y.o. ? ? ?Chief Complaint  ?Patient presents with  ? Follow-up  ? ? ?HISTORY OF PRESENT ILLNESS: ?This is a 51 y.o. male with history of diabetes and hypertension here for follow-up. ?Off medications for 3 months.  Ran out of them. ?BP Readings from Last 3 Encounters:  ?03/12/22 (!) 150/100  ?07/11/21 (!) 150/88  ?12/24/20 130/80  ? ? ? ?HPI ? ? ?Prior to Admission medications   ?Medication Sig Start Date End Date Taking? Authorizing Provider  ?amLODipine (NORVASC) 10 MG tablet Take 1 tablet (10 mg total) by mouth daily. 12/24/20   Horald Pollen, MD  ?Continuous Blood Gluc Receiver (FREESTYLE LIBRE 14 DAY READER) DEVI Sig several times a day as needed 12/24/20   Horald Pollen, MD  ?Continuous Blood Gluc Sensor (FREESTYLE LIBRE 14 DAY SENSOR) MISC Sig several times a day as needed 12/24/20   Horald Pollen, MD  ?Dulaglutide (TRULICITY) 1.5 0000000 SOPN Inject 1.5 mg into the skin once a week. 07/11/21   Horald Pollen, MD  ?gabapentin (NEURONTIN) 100 MG capsule 1 PO q HS, may increase to 1 PO TID if needed 04/24/20   Hilts, Michael, MD  ?glipiZIDE (GLUCOTROL) 5 MG tablet Take 1 tablet (5 mg total) by mouth 2 (two) times daily before a meal. 12/24/20   Kennidi Yoshida, Ines Bloomer, MD  ?HYDROcodone-acetaminophen (NORCO) 5-325 MG tablet Take 1 tablet by mouth every 8 (eight) hours as needed. ?Patient not taking: Reported on 07/11/2021 10/10/20   Jacqlyn Larsen, PA-C  ?lisinopril (ZESTRIL) 40 MG tablet Take 1 tablet (40 mg total) by mouth daily. 07/11/21   Horald Pollen, MD  ?meloxicam (MOBIC) 15 MG tablet Take 1 tablet (15 mg total) by mouth daily. ?Patient not taking: Reported on 07/11/2021 03/13/20   Horald Pollen, MD  ?metFORMIN (GLUCOPHAGE) 1000 MG tablet Take 1 tablet (1,000 mg total) by mouth 2 (two) times daily with a meal. 12/24/20   Shaconda Hajduk, Ines Bloomer, MD  ?methocarbamol (ROBAXIN) 500 MG tablet Take 1 tablet (500 mg total) by mouth every 8 (eight) hours as needed for  muscle spasms. ?Patient not taking: Reported on 07/11/2021 10/10/20   Jacqlyn Larsen, PA-C  ?rosuvastatin (CRESTOR) 5 MG tablet Take 1 tablet (5 mg total) by mouth at bedtime. 07/11/21 10/09/21  Horald Pollen, MD  ? ? ?Allergies  ?Allergen Reactions  ? Penicillins Itching  ? Sulfa Antibiotics Other (See Comments)  ?  Causes real bad headache  ? ? ?Patient Active Problem List  ? Diagnosis Date Noted  ? Hypertension associated with type 2 diabetes mellitus ([place]) 05/28/2017  ? Type 2 diabetes mellitus without complication, without long-term current use of insulin ([place]) 05/28/2017  ? ? ?Past Medical History:  ?Diagnosis Date  ? Diabetes mellitus without complication ([place])   ? Hypertension   ? ? ?No past surgical history on file. ? ?Social History  ? ?Socioeconomic History  ? Marital status: Married  ?  Spouse name: Not on file  ? Number of children: Not on file  ? Years of education: Not on file  ? Highest education level: Not on file  ?Occupational History  ? Not on file  ?Tobacco Use  ? Smoking status: Some Days  ? Smokeless tobacco: Never  ?Vaping Use  ? Vaping Use: Never used  ?Substance and Sexual Activity  ? Alcohol use: Yes  ? Drug use: No  ? Sexual activity: Yes  ?Other Topics Concern  ? Not on file  ?  Social History Narrative  ? Not on file  ? ?Social Determinants of Health  ? ?Financial Resource Strain: Not on file  ?Food Insecurity: Not on file  ?Transportation Needs: Not on file  ?Physical Activity: Not on file  ?Stress: Not on file  ?Social Connections: Not on file  ?Intimate Partner Violence: Not on file  ? ? ?Family History  ?Problem Relation Age of Onset  ? Diabetes Mother   ? Heart disease Mother   ? Hypertension Mother   ? Asthma Mother   ? Diabetes Father   ? Heart disease Father   ? Hypertension Father   ? Kidney disease Father   ? Cancer Father   ? ? ? ?Review of Systems  ?Constitutional: Negative.  Negative for chills and fever.  ?HENT: Negative.  Negative for congestion and sore throat.    ?Respiratory: Negative.  Negative for cough and shortness of breath.   ?Cardiovascular: Negative.  Negative for chest pain and palpitations.  ?Gastrointestinal:  Negative for abdominal pain, diarrhea, nausea and vomiting.  ?Genitourinary: Negative.  Negative for dysuria and hematuria.  ?Skin: Negative.  Negative for rash.  ?Neurological: Negative.  Negative for dizziness and headaches.  ?All other systems reviewed and are negative. ? ?Today's Vitals  ? 03/12/22 1308 03/12/22 1315  ?BP: (!) 150/100 (!) 142/98  ?Pulse: (!) 111   ?Temp: 98.1 ?F (36.7 ?C)   ?TempSrc: Oral   ?SpO2: 96%   ?Weight: 217 lb 2 oz (98.5 kg)   ?Height: 5\' 10"  (1.778 m)   ? ?Body mass index is 31.15 kg/m?. ? ?Physical Exam ?Vitals reviewed.  ?Constitutional:   ?   Appearance: Normal appearance.  ?HENT:  ?   Head: Normocephalic.  ?Eyes:  ?   Extraocular Movements: Extraocular movements intact.  ?   Pupils: Pupils are equal, round, and reactive to light.  ?Cardiovascular:  ?   Rate and Rhythm: Normal rate and regular rhythm.  ?   Pulses: Normal pulses.  ?   Heart sounds: Normal heart sounds.  ?Pulmonary:  ?   Effort: Pulmonary effort is normal.  ?   Breath sounds: Normal breath sounds.  ?Abdominal:  ?   General: There is no distension.  ?   Palpations: Abdomen is soft.  ?   Tenderness: There is no abdominal tenderness.  ?Musculoskeletal:  ?   Cervical back: No tenderness.  ?Lymphadenopathy:  ?   Cervical: No cervical adenopathy.  ?Skin: ?   General: Skin is warm and dry.  ?   Capillary Refill: Capillary refill takes less than 2 seconds.  ?Neurological:  ?   General: No focal deficit present.  ?   Mental Status: He is alert and oriented to person, place, and time.  ?Psychiatric:     ?   Mood and Affect: Mood normal.     ?   Behavior: Behavior normal.  ? ? ?Results for orders placed or performed in visit on 03/12/22 (from the past 24 hour(s))  ?POCT glycosylated hemoglobin (Hb A1C)     Status: Abnormal  ? Collection Time: 03/12/22  1:28 PM  ?Result  Value Ref Range  ? Hemoglobin A1C 8.7 (A) 4.0 - 5.6 %  ? HbA1c POC (<> result, manual entry)    ? HbA1c, POC (prediabetic range)    ? HbA1c, POC (controlled diabetic range)    ? ? ?ASSESSMENT & PLAN: ?A total of 48 minutes was spent with the patient and counseling/coordination of care regarding preparing for this visit, review of most  recent office visit notes, review of most recent blood work results including today's hemoglobin A1c, review of all medications and changes made, cardiovascular risks associated with uncontrolled hypertension and diabetes, education on nutrition, prognosis, documentation and need for follow-up in 3 months. ? ?Problem List Items Addressed This Visit   ? ?  ? Cardiovascular and Mediastinum  ? Hypertension associated with type 2 diabetes mellitus ([place]) - Primary  ?  Uncontrolled hypertension.  Has been off medications for several months. ?Restart amlodipine 10 mg and lisinopril 40 mg daily ?Uncontrolled diabetes with hemoglobin A1c at 8.7.  Off medications. ?Restart metformin 1000 mg twice a day, glipizide 5 mg twice a day and weekly Trulicity 1.5 mg ?Diet and nutrition discussed. ?Follow-up in 3 months. ? ?  ?  ? Relevant Medications  ? amLODipine (NORVASC) 10 MG tablet  ? Dulaglutide (TRULICITY) 1.5 0000000 SOPN  ? glipiZIDE (GLUCOTROL) 5 MG tablet  ? lisinopril (ZESTRIL) 40 MG tablet  ? metFORMIN (GLUCOPHAGE) 1000 MG tablet  ? Other Relevant Orders  ? POCT glycosylated hemoglobin (Hb A1C) (Completed)  ? Urine Microalbumin w/creat. ratio  ? Comprehensive metabolic panel  ? Lipid panel  ? ?Other Visit Diagnoses   ? ? Uncontrolled type 2 diabetes mellitus with hyperglycemia ([place])      ? Relevant Medications  ? amLODipine (NORVASC) 10 MG tablet  ? Continuous Blood Gluc Receiver (FREESTYLE LIBRE 14 DAY READER) DEVI  ? Continuous Blood Gluc Sensor (FREESTYLE LIBRE 14 DAY SENSOR) MISC  ? Dulaglutide (TRULICITY) 1.5 0000000 SOPN  ? glipiZIDE (GLUCOTROL) 5 MG tablet  ? lisinopril (ZESTRIL) 40  MG tablet  ? metFORMIN (GLUCOPHAGE) 1000 MG tablet  ? Essential hypertension      ? Relevant Medications  ? amLODipine (NORVASC) 10 MG tablet  ? lisinopril (ZESTRIL) 40 MG tablet  ? ?  ? ?Patient Instr

## 2022-03-12 NOTE — Assessment & Plan Note (Signed)
Uncontrolled hypertension.  Has been off medications for several months. ?Restart amlodipine 10 mg and lisinopril 40 mg daily ?Uncontrolled diabetes with hemoglobin A1c at 8.7.  Off medications. ?Restart metformin 1000 mg twice a day, glipizide 5 mg twice a day and weekly Trulicity 1.5 mg ?Diet and nutrition discussed. ?Follow-up in 3 months. ?

## 2022-03-14 ENCOUNTER — Other Ambulatory Visit: Payer: Self-pay | Admitting: Emergency Medicine

## 2022-03-14 DIAGNOSIS — E1165 Type 2 diabetes mellitus with hyperglycemia: Secondary | ICD-10-CM

## 2022-05-18 ENCOUNTER — Other Ambulatory Visit: Payer: Self-pay

## 2022-05-18 ENCOUNTER — Inpatient Hospital Stay (HOSPITAL_BASED_OUTPATIENT_CLINIC_OR_DEPARTMENT_OTHER)
Admission: EM | Admit: 2022-05-18 | Discharge: 2022-05-21 | DRG: 440 | Disposition: A | Discharge status: Home / Self Care | Payer: BC Managed Care – PPO | Attending: Internal Medicine | Admitting: Internal Medicine

## 2022-05-18 ENCOUNTER — Emergency Department (HOSPITAL_BASED_OUTPATIENT_CLINIC_OR_DEPARTMENT_OTHER): Payer: BC Managed Care – PPO

## 2022-05-18 ENCOUNTER — Encounter (HOSPITAL_BASED_OUTPATIENT_CLINIC_OR_DEPARTMENT_OTHER): Payer: Self-pay | Admitting: Emergency Medicine

## 2022-05-18 DIAGNOSIS — E785 Hyperlipidemia, unspecified: Secondary | ICD-10-CM | POA: Diagnosis not present

## 2022-05-18 DIAGNOSIS — E669 Obesity, unspecified: Secondary | ICD-10-CM | POA: Diagnosis not present

## 2022-05-18 DIAGNOSIS — E1122 Type 2 diabetes mellitus with diabetic chronic kidney disease: Secondary | ICD-10-CM | POA: Diagnosis present

## 2022-05-18 DIAGNOSIS — E860 Dehydration: Secondary | ICD-10-CM | POA: Diagnosis not present

## 2022-05-18 DIAGNOSIS — Z6832 Body mass index (BMI) 32.0-32.9, adult: Secondary | ICD-10-CM

## 2022-05-18 DIAGNOSIS — Z7984 Long term (current) use of oral hypoglycemic drugs: Secondary | ICD-10-CM

## 2022-05-18 DIAGNOSIS — Z791 Long term (current) use of non-steroidal anti-inflammatories (NSAID): Secondary | ICD-10-CM | POA: Diagnosis not present

## 2022-05-18 DIAGNOSIS — Z8249 Family history of ischemic heart disease and other diseases of the circulatory system: Secondary | ICD-10-CM

## 2022-05-18 DIAGNOSIS — K859 Acute pancreatitis without necrosis or infection, unspecified: Principal | ICD-10-CM | POA: Diagnosis present

## 2022-05-18 DIAGNOSIS — Z833 Family history of diabetes mellitus: Secondary | ICD-10-CM

## 2022-05-18 DIAGNOSIS — D649 Anemia, unspecified: Secondary | ICD-10-CM | POA: Diagnosis present

## 2022-05-18 DIAGNOSIS — Z79899 Other long term (current) drug therapy: Secondary | ICD-10-CM

## 2022-05-18 DIAGNOSIS — N1831 Chronic kidney disease, stage 3a: Secondary | ICD-10-CM | POA: Diagnosis present

## 2022-05-18 DIAGNOSIS — Z841 Family history of disorders of kidney and ureter: Secondary | ICD-10-CM | POA: Diagnosis not present

## 2022-05-18 DIAGNOSIS — I129 Hypertensive chronic kidney disease with stage 1 through stage 4 chronic kidney disease, or unspecified chronic kidney disease: Secondary | ICD-10-CM | POA: Diagnosis present

## 2022-05-18 DIAGNOSIS — K529 Noninfective gastroenteritis and colitis, unspecified: Secondary | ICD-10-CM | POA: Diagnosis present

## 2022-05-18 DIAGNOSIS — E1165 Type 2 diabetes mellitus with hyperglycemia: Secondary | ICD-10-CM | POA: Diagnosis present

## 2022-05-18 DIAGNOSIS — Z825 Family history of asthma and other chronic lower respiratory diseases: Secondary | ICD-10-CM

## 2022-05-18 DIAGNOSIS — F17200 Nicotine dependence, unspecified, uncomplicated: Secondary | ICD-10-CM | POA: Diagnosis present

## 2022-05-18 DIAGNOSIS — I959 Hypotension, unspecified: Secondary | ICD-10-CM | POA: Diagnosis not present

## 2022-05-18 DIAGNOSIS — R109 Unspecified abdominal pain: Secondary | ICD-10-CM | POA: Diagnosis not present

## 2022-05-18 DIAGNOSIS — R1110 Vomiting, unspecified: Secondary | ICD-10-CM | POA: Diagnosis not present

## 2022-05-18 DIAGNOSIS — Z880 Allergy status to penicillin: Secondary | ICD-10-CM

## 2022-05-18 DIAGNOSIS — K5900 Constipation, unspecified: Secondary | ICD-10-CM | POA: Diagnosis not present

## 2022-05-18 DIAGNOSIS — Z882 Allergy status to sulfonamides status: Secondary | ICD-10-CM

## 2022-05-18 DIAGNOSIS — D631 Anemia in chronic kidney disease: Secondary | ICD-10-CM | POA: Diagnosis not present

## 2022-05-18 LAB — COMPREHENSIVE METABOLIC PANEL
ALT: 12 U/L (ref 0–44)
AST: 15 U/L (ref 15–41)
Albumin: 4.4 g/dL (ref 3.5–5.0)
Alkaline Phosphatase: 53 U/L (ref 38–126)
Anion gap: 8 (ref 5–15)
BUN: 23 mg/dL — ABNORMAL HIGH (ref 6–20)
CO2: 27 mmol/L (ref 22–32)
Calcium: 10.4 mg/dL — ABNORMAL HIGH (ref 8.9–10.3)
Chloride: 103 mmol/L (ref 98–111)
Creatinine, Ser: 1.52 mg/dL — ABNORMAL HIGH (ref 0.61–1.24)
GFR, Estimated: 56 mL/min — ABNORMAL LOW (ref >60)
Glucose, Bld: 203 mg/dL — ABNORMAL HIGH (ref 70–99)
Potassium: 4.5 mmol/L (ref 3.5–5.1)
Sodium: 138 mmol/L (ref 135–145)
Total Bilirubin: 0.6 mg/dL (ref 0.3–1.2)
Total Protein: 8 g/dL (ref 6.5–8.1)

## 2022-05-18 LAB — CBC WITH DIFFERENTIAL/PLATELET
Abs Immature Granulocytes: 0.01 10*3/uL (ref 0.00–0.07)
Basophils Absolute: 0 10*3/uL (ref 0.0–0.1)
Basophils Relative: 0 %
Eosinophils Absolute: 0.1 10*3/uL (ref 0.0–0.5)
Eosinophils Relative: 2 %
HCT: 38.3 % — ABNORMAL LOW (ref 39.0–52.0)
Hemoglobin: 12.4 g/dL — ABNORMAL LOW (ref 13.0–17.0)
Immature Granulocytes: 0 %
Lymphocytes Relative: 29 %
Lymphs Abs: 1.6 10*3/uL (ref 0.7–4.0)
MCH: 28.4 pg (ref 26.0–34.0)
MCHC: 32.4 g/dL (ref 30.0–36.0)
MCV: 87.8 fL (ref 80.0–100.0)
Monocytes Absolute: 0.5 10*3/uL (ref 0.1–1.0)
Monocytes Relative: 9 %
Neutro Abs: 3.4 10*3/uL (ref 1.7–7.7)
Neutrophils Relative %: 60 %
Platelets: 252 10*3/uL (ref 150–400)
RBC: 4.36 MIL/uL (ref 4.22–5.81)
RDW: 13.2 % (ref 11.5–15.5)
WBC: 5.6 10*3/uL (ref 4.0–10.5)
nRBC: 0 % (ref 0.0–0.2)

## 2022-05-18 LAB — LIPASE, BLOOD: Lipase: 489 U/L — ABNORMAL HIGH (ref 11–51)

## 2022-05-18 LAB — GLUCOSE, CAPILLARY: Glucose-Capillary: 114 mg/dL — ABNORMAL HIGH (ref 70–99)

## 2022-05-18 MED ORDER — INSULIN ASPART 100 UNIT/ML IJ SOLN
0.0000 [IU] | Freq: Three times a day (TID) | INTRAMUSCULAR | Status: DC
  Administered 2022-05-19 (×2): 2 [IU] via SUBCUTANEOUS
  Administered 2022-05-19: 1 [IU] via SUBCUTANEOUS
  Administered 2022-05-20 – 2022-05-21 (×4): 2 [IU] via SUBCUTANEOUS
  Administered 2022-05-21: 1 [IU] via SUBCUTANEOUS

## 2022-05-18 MED ORDER — IOHEXOL 300 MG/ML  SOLN
100.0000 mL | Freq: Once | INTRAMUSCULAR | Status: AC | PRN
  Administered 2022-05-18: 100 mL via INTRAVENOUS

## 2022-05-18 MED ORDER — ONDANSETRON HCL 4 MG/2ML IJ SOLN
4.0000 mg | Freq: Once | INTRAMUSCULAR | Status: AC
  Administered 2022-05-18: 4 mg via INTRAVENOUS
  Filled 2022-05-18: qty 2

## 2022-05-18 MED ORDER — HYDROMORPHONE HCL 1 MG/ML IJ SOLN
0.5000 mg | INTRAMUSCULAR | Status: DC | PRN
  Administered 2022-05-19 – 2022-05-20 (×2): 0.5 mg via INTRAVENOUS
  Filled 2022-05-18 (×3): qty 0.5

## 2022-05-18 MED ORDER — ACETAMINOPHEN 325 MG PO TABS: 650.0000 mg | ORAL_TABLET | Freq: Four times a day (QID) | ORAL | Status: DC | PRN

## 2022-05-18 MED ORDER — METOCLOPRAMIDE HCL 5 MG/ML IJ SOLN
10.0000 mg | Freq: Once | INTRAMUSCULAR | Status: AC
  Administered 2022-05-18: 10 mg via INTRAVENOUS
  Filled 2022-05-18: qty 2

## 2022-05-18 MED ORDER — MORPHINE SULFATE (PF) 4 MG/ML IV SOLN
4.0000 mg | Freq: Once | INTRAVENOUS | Status: AC
  Administered 2022-05-18: 4 mg via INTRAVENOUS
  Filled 2022-05-18: qty 1

## 2022-05-18 MED ORDER — ROSUVASTATIN CALCIUM 5 MG PO TABS
5.0000 mg | ORAL_TABLET | Freq: Every day | ORAL | Status: DC
  Administered 2022-05-18 – 2022-05-20 (×3): 5 mg via ORAL
  Filled 2022-05-18 (×3): qty 1

## 2022-05-18 MED ORDER — SODIUM CHLORIDE 0.9 % IV BOLUS
1000.0000 mL | Freq: Once | INTRAVENOUS | Status: AC
Start: 2022-05-18 — End: 2022-05-18
  Administered 2022-05-18: 1000 mL via INTRAVENOUS

## 2022-05-18 MED ORDER — LACTATED RINGERS IV SOLN: INTRAVENOUS | Status: DC

## 2022-05-18 MED ORDER — BISACODYL 10 MG RE SUPP: 10.0000 mg | Freq: Every day | RECTAL | Status: DC | PRN

## 2022-05-18 MED ORDER — POLYETHYLENE GLYCOL 3350 17 G PO PACK: 17.0000 g | PACK | Freq: Every day | ORAL | Status: DC | PRN

## 2022-05-18 MED ORDER — PROCHLORPERAZINE EDISYLATE 10 MG/2ML IJ SOLN
10.0000 mg | Freq: Four times a day (QID) | INTRAMUSCULAR | Status: DC | PRN
Start: 2022-05-18 — End: 2022-05-21
  Administered 2022-05-19 – 2022-05-20 (×2): 10 mg via INTRAVENOUS
  Filled 2022-05-18 (×2): qty 2

## 2022-05-18 MED ORDER — OXYCODONE HCL 5 MG PO TABS
5.0000 mg | ORAL_TABLET | Freq: Four times a day (QID) | ORAL | Status: DC | PRN
  Filled 2022-05-18: qty 1

## 2022-05-18 MED ORDER — MELATONIN 5 MG PO TABS: 5.0000 mg | ORAL_TABLET | Freq: Every evening | ORAL | Status: DC | PRN

## 2022-05-18 MED ORDER — INSULIN ASPART 100 UNIT/ML IJ SOLN: 0.0000 [IU] | Freq: Every day | INTRAMUSCULAR | Status: DC

## 2022-05-18 MED ORDER — SENNOSIDES-DOCUSATE SODIUM 8.6-50 MG PO TABS
2.0000 | ORAL_TABLET | Freq: Every day | ORAL | Status: DC
  Administered 2022-05-18 – 2022-05-20 (×3): 2 via ORAL
  Filled 2022-05-18 (×3): qty 2

## 2022-05-18 MED ORDER — AMLODIPINE BESYLATE 10 MG PO TABS
10.0000 mg | ORAL_TABLET | Freq: Every day | ORAL | Status: DC
  Filled 2022-05-18: qty 1

## 2022-05-18 NOTE — ED Notes (Signed)
Pt is aware we need a urine specimen, cup @ beside.

## 2022-05-18 NOTE — ED Provider Notes (Signed)
[facility] EMERGENCY DEPT Provider Note   CSN: [id number] Arrival date & time: 05/18/22  1323     History  Chief Complaint  Patient presents with   Abdominal Pain    Paul Roberson is a 51 y.o. male.  Patient presents ER chief complaint of 2 days of abdominal pain, worse in epigastric region.  Associate with chills and vomiting at home.  States it feels worse when he eats food.  However did not improve today and returns back to the ER denies diarrhea denies fevers denies chest pain denies shortness of breath.       Home Medications Prior to Admission medications   Medication Sig Start Date End Date Taking? Authorizing Provider  amLODipine (NORVASC) 10 MG tablet Take 1 tablet (10 mg total) by mouth daily. 03/12/22   Georgina Quint, MD  Continuous Blood Gluc Receiver (FREESTYLE LIBRE 2 READER) DEVI Dose,Route,Frequency: As Directed 03/14/22   Georgina Quint, MD  Continuous Blood Gluc Sensor (FREESTYLE LIBRE 2 SENSOR) MISC Dose, Route Frequency: As Directed 03/14/22   Georgina Quint, MD  Dulaglutide (TRULICITY) 1.5 MG/0.5ML SOPN Inject 1.5 mg into the skin once a week. 03/12/22   Georgina Quint, MD  gabapentin (NEURONTIN) 100 MG capsule 1 PO q HS, may increase to 1 PO TID if needed Patient not taking: Reported on 03/12/2022 04/24/20   Hilts, Casimiro Needle, MD  glipiZIDE (GLUCOTROL) 5 MG tablet Take 1 tablet (5 mg total) by mouth 2 (two) times daily before a meal. 03/12/22   Sagardia, Eilleen Kempf, MD  HYDROcodone-acetaminophen (NORCO) 5-325 MG tablet Take 1 tablet by mouth every 8 (eight) hours as needed. Patient not taking: Reported on 07/11/2021 10/10/20   Dartha Lodge, PA-C  lisinopril (ZESTRIL) 40 MG tablet Take 1 tablet (40 mg total) by mouth daily. 03/12/22   Georgina Quint, MD  meloxicam (MOBIC) 15 MG tablet Take 1 tablet (15 mg total) by mouth daily. Patient not taking: Reported on 07/11/2021 03/13/20   Georgina Quint, MD  metFORMIN  (GLUCOPHAGE) 1000 MG tablet Take 1 tablet (1,000 mg total) by mouth 2 (two) times daily with a meal. 03/12/22   Sagardia, Eilleen Kempf, MD  methocarbamol (ROBAXIN) 500 MG tablet Take 1 tablet (500 mg total) by mouth every 8 (eight) hours as needed for muscle spasms. Patient not taking: Reported on 07/11/2021 10/10/20   Dartha Lodge, PA-C  rosuvastatin (CRESTOR) 5 MG tablet Take 1 tablet (5 mg total) by mouth at bedtime. 07/11/21 10/09/21  Georgina Quint, MD      Allergies    Penicillins and Sulfa antibiotics    Review of Systems   Review of Systems  Constitutional:  Negative for fever.  HENT:  Negative for ear pain and sore throat.   Eyes:  Negative for pain.  Respiratory:  Negative for cough.   Cardiovascular:  Negative for chest pain.  Gastrointestinal:  Positive for abdominal pain.  Genitourinary:  Negative for flank pain.  Musculoskeletal:  Negative for back pain.  Skin:  Negative for color change and rash.  Neurological:  Negative for syncope.  All other systems reviewed and are negative.   Physical Exam Updated Vital Signs BP 125/87   Pulse 95   Temp 98 F (36.7 C) (Oral)   Resp 18   Ht 5\' 10"  (1.778 m)   Wt 102.1 kg   SpO2 100%   BMI 32.28 kg/m  Physical Exam Constitutional:      Appearance: He is well-developed.  HENT:     Head: Normocephalic.     Nose: Nose normal.  Eyes:     Extraocular Movements: Extraocular movements intact.  Cardiovascular:     Rate and Rhythm: Normal rate.  Pulmonary:     Effort: Pulmonary effort is normal.  Abdominal:     Comments: Mild diffuse tenderness on exam no specific guarding or rebound noted.  Skin:    Coloration: Skin is not jaundiced.  Neurological:     Mental Status: He is alert. Mental status is at baseline.     ED Results / Procedures / Treatments   Labs (all labs ordered are listed, but only abnormal results are displayed) Labs Reviewed  CBC WITH DIFFERENTIAL/PLATELET - Abnormal; Notable for the following  components:      Result Value   Hemoglobin 12.4 (*)    HCT 38.3 (*)    All other components within normal limits  COMPREHENSIVE METABOLIC PANEL  LIPASE, BLOOD  URINALYSIS, ROUTINE W REFLEX MICROSCOPIC    EKG None  Radiology No results found.  Procedures Procedures    Medications Ordered in ED Medications  morphine (PF) 4 MG/ML injection 4 mg (4 mg Intravenous Given 05/18/22 1500)  ondansetron (ZOFRAN) injection 4 mg (4 mg Intravenous Given 05/18/22 1458)    ED Course/ Medical Decision Making/ A&P                           Medical Decision Making Amount and/or Complexity of Data Reviewed Labs: ordered. Radiology: ordered.  Risk Prescription drug management.   History is obtained from family bedside.  Review of systems shows office visit with primary care physician March 12, 2022 for hypertension.  Cardiac monitoring showing sinus rhythm.  Tenderness studies sent including CBC chemistry lipase urinalysis.  CT of the pelvis also pursued.  Results are pending.  Patient to be signed out to oncoming provider.        Final Clinical Impression(s) / ED Diagnoses Final diagnoses:  Abdominal pain, unspecified abdominal location    Rx / DC Orders ED Discharge Orders     None         Cheryll Cockayne, MD 05/18/22 [phone]

## 2022-05-18 NOTE — Hospital Course (Signed)
Mr. Rhinehart is a 51 y.o. M with DM, HTN, obesity who presented with epigastric pain for 2 days radiating to the back with vomiting/nausea.  In the ER, lipase >400, CT showed some stranding in the left abdomen, read as enteritis".  Unable to keep anything down in the ER, so admitted for fluids, bowel rest.  Denied alcohol. LFTs normal.

## 2022-05-18 NOTE — H&P (Addendum)
History and Physical  Paul Roberson:[id number] DOB: 06/21/1971 DOA: 05/18/2022  Referring physician: Accepted by Dr. Maryfrances Bunnell, [facility], Hospitalist service.  PCP: Georgina Quint, MD  Outpatient Specialists: None Patient coming from: Home  Chief Complaint: Nausea, vomiting, upper abdominal pain radiating to the back for 1 week.   HPI: Paul Roberson is a 51 y.o. male with medical history significant for type 2 diabetes, essential hypertension, hyperlipidemia, obesity, who initially presented to [facility] ED with complaints of nausea, vomiting, and severe sharp upper abdominal pain radiating to his back intermittently x1 week.  Also complains of constipation for 5 days.  Took magnesium citrate and had a small bowel movement yesterday, feels that he still needs to have a good bowel movement.  States he has had difficulty tolerating oral intake due to his nausea and abdominal pain.  His mother had him come to the ED for further evaluation.    Work-up in the ED revealed elevated lipase level greater than 600 and hyperglycemia.  CT evidence of mild enteritis.  The patient received 1 L IV fluid bolus normal saline, 4 mg IV morphine x1, 4 mg IV Zofran x1, 10 mg IV Reglan x1 in the ED.  The hospitalist service, [facility], was asked to admit.  The patient was accepted and admitted by Dr. Maryfrances Bunnell to [facility] long hospital MedSurg unit as observation status.  At the time of this visit, the patient's abdominal pain is improved after IV narcotics.  His nausea is also improved after IV antiemetics.  Was started on clear liquid diet and will advance his diet as tolerated.  ED Course: Tmax 98.  BP 127/83, pulse 87, respiratory 18, saturation 100% on room air.  Lab studies significant for serum glucose 203, BUN 23, creatinine 1.52, calcium 10.4, lipase 489, GFR 56.  WBC 5.6, hemoglobin 12.4, platelet 252.  Review of Systems: Review of systems as noted in the HPI. All other systems reviewed and are negative.   Past  Medical History:  Diagnosis Date   Diabetes mellitus without complication (HCC)    Hypertension    History reviewed. No pertinent surgical history.  Social History:  reports that he has been smoking. He has never used smokeless tobacco. He reports current alcohol use. He reports that he does not use drugs.   Allergies  Allergen Reactions   Penicillins Itching   Sulfa Antibiotics Other (See Comments)    Causes real bad headache    Family History  Problem Relation Age of Onset   Diabetes Mother    Heart disease Mother    Hypertension Mother    Asthma Mother    Diabetes Father    Heart disease Father    Hypertension Father    Kidney disease Father    Cancer Father       Prior to Admission medications   Medication Sig Start Date End Date Taking? Authorizing Provider  amLODipine (NORVASC) 10 MG tablet Take 1 tablet (10 mg total) by mouth daily. 03/12/22   Georgina Quint, MD  Continuous Blood Gluc Receiver (FREESTYLE LIBRE 2 READER) DEVI Dose,Route,Frequency: As Directed 03/14/22   Georgina Quint, MD  Continuous Blood Gluc Sensor (FREESTYLE LIBRE 2 SENSOR) MISC Dose, Route Frequency: As Directed 03/14/22   Georgina Quint, MD  Dulaglutide (TRULICITY) 1.5 MG/0.5ML SOPN Inject 1.5 mg into the skin once a week. 03/12/22   Georgina Quint, MD  gabapentin (NEURONTIN) 100 MG capsule 1 PO q HS, may increase to 1 PO TID if needed  Patient not taking: Reported on 03/12/2022 04/24/20   Hilts, Legrand Como, MD  glipiZIDE (GLUCOTROL) 5 MG tablet Take 1 tablet (5 mg total) by mouth 2 (two) times daily before a meal. 03/12/22   Sagardia, Ines Bloomer, MD  HYDROcodone-acetaminophen (NORCO) 5-325 MG tablet Take 1 tablet by mouth every 8 (eight) hours as needed. Patient not taking: Reported on 07/11/2021 10/10/20   Jacqlyn Larsen, PA-C  lisinopril (ZESTRIL) 40 MG tablet Take 1 tablet (40 mg total) by mouth daily. 03/12/22   Horald Pollen, MD  meloxicam (MOBIC) 15 MG tablet Take 1  tablet (15 mg total) by mouth daily. Patient not taking: Reported on 07/11/2021 03/13/20   Horald Pollen, MD  metFORMIN (GLUCOPHAGE) 1000 MG tablet Take 1 tablet (1,000 mg total) by mouth 2 (two) times daily with a meal. 03/12/22   Sagardia, Ines Bloomer, MD  methocarbamol (ROBAXIN) 500 MG tablet Take 1 tablet (500 mg total) by mouth every 8 (eight) hours as needed for muscle spasms. Patient not taking: Reported on 07/11/2021 10/10/20   Jacqlyn Larsen, PA-C  rosuvastatin (CRESTOR) 5 MG tablet Take 1 tablet (5 mg total) by mouth at bedtime. 07/11/21 10/09/21  Horald Pollen, MD    Physical Exam: BP 127/83 (BP Location: Left Arm)   Pulse 87   Temp (!) 97.5 F (36.4 C) (Oral)   Resp 18   Ht 5\' 10"  (1.778 m)   Wt 102.1 kg   SpO2 100%   BMI 32.28 kg/m   General: 51 y.o. year-old male well developed well nourished in no acute distress.  Alert and oriented x3. Cardiovascular: Regular rate and rhythm with no rubs or gallops.  No thyromegaly or JVD noted.  No lower extremity edema. 2/4 pulses in all 4 extremities. Respiratory: Clear to auscultation with no wheezes or rales. Good inspiratory effort. Abdomen: Soft upper abdomen tenderness with palpation.  Nondistended with normal bowel sounds x4 quadrants. Muskuloskeletal: No cyanosis, clubbing or edema noted bilaterally Neuro: CN II-XII intact, strength, sensation, reflexes Skin: No ulcerative lesions noted or rashes Psychiatry: Judgement and insight appear normal. Mood is appropriate for condition and setting          Labs on Admission:  Basic Metabolic Panel: Recent Labs  Lab 05/18/22 1455  NA 138  K 4.5  CL 103  CO2 27  GLUCOSE 203*  BUN 23*  CREATININE 1.52*  CALCIUM 10.4*   Liver Function Tests: Recent Labs  Lab 05/18/22 1455  AST 15  ALT 12  ALKPHOS 53  BILITOT 0.6  PROT 8.0  ALBUMIN 4.4   Recent Labs  Lab 05/18/22 1455  LIPASE 489*   No results for input(s): "AMMONIA" in the last 168  hours. CBC: Recent Labs  Lab 05/18/22 1455  WBC 5.6  NEUTROABS 3.4  HGB 12.4*  HCT 38.3*  MCV 87.8  PLT 252   Cardiac Enzymes: No results for input(s): "CKTOTAL", "CKMB", "CKMBINDEX", "TROPONINI" in the last 168 hours.  BNP (last 3 results) No results for input(s): "BNP" in the last 8760 hours.  ProBNP (last 3 results) No results for input(s): "PROBNP" in the last 8760 hours.  CBG: No results for input(s): "GLUCAP" in the last 168 hours.  Radiological Exams on Admission: CT Abdomen Pelvis W Contrast  Result Date: 05/18/2022 CLINICAL DATA:  Abdominal pain with nausea and vomiting for several days. Chills. EXAM: CT ABDOMEN AND PELVIS WITH CONTRAST TECHNIQUE: Multidetector CT imaging of the abdomen and pelvis was performed using the standard protocol following bolus  administration of intravenous contrast. RADIATION DOSE REDUCTION: This exam was performed according to the departmental dose-optimization program which includes automated exposure control, adjustment of the mA and/or kV according to patient size and/or use of iterative reconstruction technique. CONTRAST:  153mL OMNIPAQUE IOHEXOL 300 MG/ML  SOLN COMPARISON:  10/10/2020 FINDINGS: Lower Chest: No acute findings. Hepatobiliary: No hepatic masses identified. Gallbladder is unremarkable. No evidence of biliary ductal dilatation. Pancreas:  No mass or inflammatory changes. Spleen: Within normal limits in size and appearance. Adrenals/Urinary Tract: No masses identified. No evidence of ureteral calculi or hydronephrosis. Unremarkable unopacified urinary bladder. Stomach/Bowel: Mild wall thickening of several small bowel loops is seen in the left abdomen with adjacent mesenteric soft tissue stranding. No evidence of bowel obstruction, pneumatosis, or abscess. Remainder of bowel including the terminal ileum is unremarkable in appearance. Normal appendix visualized. Vascular/Lymphatic: No pathologically enlarged lymph nodes. No acute vascular  findings. Aortic atherosclerotic calcification incidentally noted. Reproductive:  No mass or other significant abnormality. Other:  None. Musculoskeletal:  No suspicious bone lesions identified. IMPRESSION: Mild enteritis involving several small bowel loops in left abdomen. No evidence of bowel obstruction, pneumatosis, or abscess. Electronically Signed   By: Marlaine Hind M.D.   On: 05/18/2022 16:10    EKG: I independently viewed the EKG done and my findings are as followed: None available at the time of this exam.  Assessment/Plan Present on Admission:  Acute pancreatitis  Principal Problem:   Acute pancreatitis  Acute uncomplicated pancreatitis, unclear etiology. Gallbladder is unremarkable, no evidence of biliary ductal dilatation.  No gallstones. Presented with severe upper abdomen pain radiated to his back and elevated lipase level more than 3 times upper level of normal. Past triglycerides normal 132. Mild hypercalcemia 10.4. Patient is a smoker and light alcohol user which synergistically can contribute to acute pancreatitis.  Also on Trulicity, which can contribute. Advised on alcohol and tobacco cessation at bedside with his mother present in the room. Hold off home Trulicity, due to possible side effect of pancreatitis IV fluid hydration LR 125 cc/h IV analgesics as needed IV antiemetics as needed Admission lipase 489, repeat level in the morning. Start clear liquid diet, advance as tolerated.  Mild enteritis, seen on CT scan Afebrile with no leukocytosis Mild enteritis involving several small bowel loops in left abdomen. No evidence of bowel obstruction, pneumatosis, or abscess. No diarrhea, monitor for now  Mild hypercalcemia, likely from dehydration Calcium 10.4 IV fluid hydration Repeat chemistry panel in the morning  Type 2 diabetes with hyperglycemia Hemoglobin A1c 8.7 on 03/12/2022 Hold off home Trulicity, due to pancreatitis as possible side effect Hold off  home hypoglycemics. Start insulin sliding scale.  CKD 3 A Appears to be close to his baseline creatinine 1.5 with GFR 56 Avoid nephrotoxic agents, dehydration and hypotension Continue IV fluid hydration Monitor urine output Repeat renal panel in the morning  Polysubstance use Tobacco and light alcohol No evidence of withdrawal In the process of tobacco cessation Polysubstance cessation counseled on at bedside  Obesity BMI 32 Recommend weight loss outpatient with regular physical activity and healthy dieting.  Essential hypertension BP stable Resume home Norvasc  Hyperlipidemia Resume home Crestor  Constipation Senokot 2 tablets nightly Dulcolax suppository as needed    DVT prophylaxis: SCDs  Code Status: Full code  Family Communication: Mother at bedside  Disposition Plan: Was accepted and admitted by Dr. Loleta Books, [facility], to MedSurg unit as observation status.  Consults called: None.  Admission status: Observation status.   Status is: Observation  Darlin Drop MD [facility] Pager [phone]  If 7PM-7AM, please contact night-coverage [email or web address] Password [facility]  05/18/2022, 7:59 PM

## 2022-05-18 NOTE — ED Provider Notes (Signed)
51 yo male here with abdominal pain Pending CT abdomen, labs  *  CT imaging personally viewed interpreted, consistent with enteritis.  Labs consistent with acute pancreatitis.  This may be pancreatitis secondary to a viral enteritis.  Patient will be admitted as he has persistent nausea and vomiting.  Physical Exam  BP 125/87   Pulse 95   Temp 98 F (36.7 C) (Oral)   Resp 18   Ht 5\' 10"  (1.778 m)   Wt 102.1 kg   SpO2 100%   BMI 32.28 kg/m   Physical Exam  Procedures  Procedures  ED Course / MDM    Medical Decision Making Amount and/or Complexity of Data Reviewed Labs: ordered. Radiology: ordered.  Risk Prescription drug management. Decision regarding hospitalization.   CT imaging personally viewed interpreted, consistent with enteritis.  Labs consistent with acute pancreatitis.  This may be pancreatitis secondary to a viral enteritis.  Patient will be admitted as he has persistent nausea and vomiting.  No acute biliary disease or bile duct dilatation noted on CT imaging.  Patient does not drink alcohol.  He has never had pancreatitis before.  No new medications.  I do believe the patient is reasonably safe and stable for transport by private vehicle, with his mother driving him directly to the hospital once a bed is available.  They cite cost concerns with ambulance transfer, which is reasonable.      , MD 05/18/22 [phone]

## 2022-05-18 NOTE — ED Triage Notes (Signed)
Patient c/o abdominal pain with nausea and vomiting, chills and upper back pain when he eats x couple days.

## 2022-05-18 NOTE — Care Plan (Signed)
Paul Roberson is a 51 y.o. M with DM, HTN, obesity presented with epigastric pain, elevated lipase.  CT shows some stranding in the left abdomen, read as enteritis" but clinically appears to be acute uncomplicated pancreatitis.  NPO, MIVF, analgesics

## 2022-05-19 DIAGNOSIS — K5900 Constipation, unspecified: Secondary | ICD-10-CM | POA: Diagnosis present

## 2022-05-19 DIAGNOSIS — Z6832 Body mass index (BMI) 32.0-32.9, adult: Secondary | ICD-10-CM | POA: Diagnosis not present

## 2022-05-19 DIAGNOSIS — K859 Acute pancreatitis without necrosis or infection, unspecified: Secondary | ICD-10-CM | POA: Diagnosis present

## 2022-05-19 DIAGNOSIS — E669 Obesity, unspecified: Secondary | ICD-10-CM | POA: Diagnosis present

## 2022-05-19 DIAGNOSIS — Z825 Family history of asthma and other chronic lower respiratory diseases: Secondary | ICD-10-CM | POA: Diagnosis not present

## 2022-05-19 DIAGNOSIS — Z791 Long term (current) use of non-steroidal anti-inflammatories (NSAID): Secondary | ICD-10-CM | POA: Diagnosis not present

## 2022-05-19 DIAGNOSIS — I959 Hypotension, unspecified: Secondary | ICD-10-CM | POA: Diagnosis not present

## 2022-05-19 DIAGNOSIS — E1165 Type 2 diabetes mellitus with hyperglycemia: Secondary | ICD-10-CM | POA: Diagnosis present

## 2022-05-19 DIAGNOSIS — Z7984 Long term (current) use of oral hypoglycemic drugs: Secondary | ICD-10-CM | POA: Diagnosis not present

## 2022-05-19 DIAGNOSIS — E860 Dehydration: Secondary | ICD-10-CM | POA: Diagnosis present

## 2022-05-19 DIAGNOSIS — Z841 Family history of disorders of kidney and ureter: Secondary | ICD-10-CM | POA: Diagnosis not present

## 2022-05-19 DIAGNOSIS — D631 Anemia in chronic kidney disease: Secondary | ICD-10-CM | POA: Diagnosis present

## 2022-05-19 DIAGNOSIS — I129 Hypertensive chronic kidney disease with stage 1 through stage 4 chronic kidney disease, or unspecified chronic kidney disease: Secondary | ICD-10-CM | POA: Diagnosis present

## 2022-05-19 DIAGNOSIS — Z79899 Other long term (current) drug therapy: Secondary | ICD-10-CM | POA: Diagnosis not present

## 2022-05-19 DIAGNOSIS — N1831 Chronic kidney disease, stage 3a: Secondary | ICD-10-CM | POA: Diagnosis present

## 2022-05-19 DIAGNOSIS — Z8249 Family history of ischemic heart disease and other diseases of the circulatory system: Secondary | ICD-10-CM | POA: Diagnosis not present

## 2022-05-19 DIAGNOSIS — E1122 Type 2 diabetes mellitus with diabetic chronic kidney disease: Secondary | ICD-10-CM | POA: Diagnosis present

## 2022-05-19 DIAGNOSIS — Z882 Allergy status to sulfonamides status: Secondary | ICD-10-CM | POA: Diagnosis not present

## 2022-05-19 DIAGNOSIS — Z833 Family history of diabetes mellitus: Secondary | ICD-10-CM | POA: Diagnosis not present

## 2022-05-19 DIAGNOSIS — K529 Noninfective gastroenteritis and colitis, unspecified: Secondary | ICD-10-CM | POA: Diagnosis present

## 2022-05-19 DIAGNOSIS — D649 Anemia, unspecified: Secondary | ICD-10-CM | POA: Diagnosis present

## 2022-05-19 DIAGNOSIS — E785 Hyperlipidemia, unspecified: Secondary | ICD-10-CM | POA: Diagnosis present

## 2022-05-19 DIAGNOSIS — Z880 Allergy status to penicillin: Secondary | ICD-10-CM | POA: Diagnosis not present

## 2022-05-19 LAB — COMPREHENSIVE METABOLIC PANEL
ALT: 14 U/L (ref 0–44)
AST: 15 U/L (ref 15–41)
Albumin: 3.9 g/dL (ref 3.5–5.0)
Alkaline Phosphatase: 60 U/L (ref 38–126)
Anion gap: 7 (ref 5–15)
BUN: 19 mg/dL (ref 6–20)
CO2: 25 mmol/L (ref 22–32)
Calcium: 9.4 mg/dL (ref 8.9–10.3)
Chloride: 107 mmol/L (ref 98–111)
Creatinine, Ser: 1.25 mg/dL — ABNORMAL HIGH (ref 0.61–1.24)
GFR, Estimated: >60 mL/min (ref >60)
Glucose, Bld: 134 mg/dL — ABNORMAL HIGH (ref 70–99)
Potassium: 4.4 mmol/L (ref 3.5–5.1)
Sodium: 139 mmol/L (ref 135–145)
Total Bilirubin: 0.8 mg/dL (ref 0.3–1.2)
Total Protein: 7.4 g/dL (ref 6.5–8.1)

## 2022-05-19 LAB — LIPASE, BLOOD: Lipase: 166 U/L — ABNORMAL HIGH (ref 11–51)

## 2022-05-19 LAB — TRIGLYCERIDES: Triglycerides: 137 mg/dL (ref <150)

## 2022-05-19 LAB — CBC WITH DIFFERENTIAL/PLATELET
Abs Immature Granulocytes: 0.05 10*3/uL (ref 0.00–0.07)
Basophils Absolute: 0 10*3/uL (ref 0.0–0.1)
Basophils Relative: 0 %
Eosinophils Absolute: 0.1 10*3/uL (ref 0.0–0.5)
Eosinophils Relative: 2 %
HCT: 39 % (ref 39.0–52.0)
Hemoglobin: 12.7 g/dL — ABNORMAL LOW (ref 13.0–17.0)
Immature Granulocytes: 1 %
Lymphocytes Relative: 35 %
Lymphs Abs: 2.1 10*3/uL (ref 0.7–4.0)
MCH: 28.9 pg (ref 26.0–34.0)
MCHC: 32.6 g/dL (ref 30.0–36.0)
MCV: 88.8 fL (ref 80.0–100.0)
Monocytes Absolute: 0.5 10*3/uL (ref 0.1–1.0)
Monocytes Relative: 8 %
Neutro Abs: 3.2 10*3/uL (ref 1.7–7.7)
Neutrophils Relative %: 54 %
Platelets: 222 10*3/uL (ref 150–400)
RBC: 4.39 MIL/uL (ref 4.22–5.81)
RDW: 13.1 % (ref 11.5–15.5)
WBC: 6 10*3/uL (ref 4.0–10.5)
nRBC: 0 % (ref 0.0–0.2)

## 2022-05-19 LAB — GLUCOSE, CAPILLARY
Glucose-Capillary: 176 mg/dL — ABNORMAL HIGH (ref 70–99)
Glucose-Capillary: 150 mg/dL — ABNORMAL HIGH (ref 70–99)
Glucose-Capillary: 169 mg/dL — ABNORMAL HIGH (ref 70–99)
Glucose-Capillary: 159 mg/dL — ABNORMAL HIGH (ref 70–99)

## 2022-05-19 LAB — HEMOGLOBIN A1C
Hgb A1c MFr Bld: 10.1 % — ABNORMAL HIGH (ref 4.8–5.6)
Mean Plasma Glucose: 243.17 mg/dL

## 2022-05-19 LAB — PHOSPHORUS: Phosphorus: 3.4 mg/dL (ref 2.5–4.6)

## 2022-05-19 LAB — HIV ANTIBODY (ROUTINE TESTING W REFLEX): HIV Screen 4th Generation wRfx: NONREACTIVE

## 2022-05-19 LAB — MAGNESIUM: Magnesium: 1.9 mg/dL (ref 1.7–2.4)

## 2022-05-19 MED ORDER — SODIUM CHLORIDE 0.9 % IV BOLUS
500.0000 mL | Freq: Once | INTRAVENOUS | Status: AC
  Administered 2022-05-19: 500 mL via INTRAVENOUS

## 2022-05-19 MED ORDER — POLYETHYLENE GLYCOL 3350 17 G PO PACK
17.0000 g | PACK | Freq: Every day | ORAL | Status: DC
  Administered 2022-05-19 – 2022-05-20 (×2): 17 g via ORAL
  Filled 2022-05-19 (×3): qty 1

## 2022-05-19 MED ORDER — LACTATED RINGERS IV SOLN
INTRAVENOUS | Status: DC
Start: 2022-05-19 — End: 2022-05-21

## 2022-05-19 NOTE — Progress Notes (Signed)
  Transition of Care [facility]) Screening Note   Patient Details  Name: Paul Roberson Date of Birth: Apr 23, 1971   Transition of Care [facility]) CM/SW Contact:    Amada Jupiter, LCSW Phone Number: 05/19/2022, 12:23 PM    Transition of Care [facility]) has reviewed patient and no TOC needs have been identified at this time. We will continue to monitor patient advancement through interdisciplinary progression rounds. If new patient transition needs arise, please place a TOC consult.

## 2022-05-19 NOTE — Progress Notes (Addendum)
PROGRESS NOTE    Paul Roberson  ONG:[id number] DOB: 03-21-1971 DOA: 05/18/2022 PCP: Georgina Quint, MD   Brief Narrative: 51 year old with past medical history significant for diabetes type 2, hypertension, hyperlipidemia, obesity presented complaining of nausea vomiting upper abdominal pain radiating to his back for a week.  Reports constipation.  He was found to have elevated lipase, CT with evidence of mild enteritis.  Patient admitted for acute pancreatitis.   Assessment & Plan:   Principal Problem:   Acute pancreatitis  1-Acute pancreatitis: He denies any recent alcohol use.  CT abdomen pelvis: Mild enteritis involving several small bowel loops in left abdomen. No evidence of bowel obstruction, pneumatosis, or abscess. . Gallbladder is unremarkable. No evidence of biliary ductal dilatation. Check triglycerides, IgG4.  Continue with IV fluids.  Advanced die to full liquid.  Will ask pharmacy to review his meds. (Trulicity, less likely metformin, lisinopril, crestor.   2-Mild enteritis seen on CT He is asymptomatic.  Denies diarrhea.  Monitor.  He will need follow up with GI>   Mild hypercalcemia: Resolved with IV fluids.   Diabetes type 2: Continue with SSI.   CKD 3A: Creatinine baseline 1.4 Cr peak to 1.5  Continue with IV fluids.   Polysubstance use: Counseling provided.  He denies drinking alcohol.   Obesity: BMI 32 Needs life style modification.   Hypertension: Hold Norvasc SBP soft.  Will give IV bolus.   Hypotension; IV bolus ordered.   Constipation: Schedule Miralax.     Estimated body mass index is 32.28 kg/m as calculated from the following:   Height as of this encounter: 5\' 10"  (1.778 m).   Weight as of this encounter: 102.1 kg.   DVT prophylaxis: SCD Code Status: Full code Family Communication: Disposition Plan:  Status is: Observation The patient remains OBS appropriate and will d/c before 2 midnights.    Consultants:   none  Procedures:  none  Antimicrobials:    Subjective: He report improvement abdominal pain he denies diarrhea. Last time he drink alcohol was months ago.  He is constipated.    Objective: Vitals:   05/18/22 2129 05/19/22 0203 05/19/22 0533 05/19/22 0543  BP: (!) 146/95 128/85 (!) 146/92 101/60  Pulse: 85 89 91 91  Resp: 18 18 20 18   Temp: 97.8 F (36.6 C) 97.7 F (36.5 C) 98 F (36.7 C) 98.2 F (36.8 C)  TempSrc: Oral Oral Oral Oral  SpO2: 100% 99% 99% 100%  Weight:      Height:        Intake/Output Summary (Last 24 hours) at 05/19/2022 0715 Last data filed at 05/19/2022 0700 Gross per 24 hour  Intake 2902.17 ml  Output 550 ml  Net 2352.17 ml   Filed Weights   05/18/22 1330  Weight: 102.1 kg    Examination:  General exam: Appears calm and comfortable  Respiratory system: Clear to auscultation. Respiratory effort normal. Cardiovascular system: S1 & S2 heard, RRR.  Gastrointestinal system: Abdomen is nondistended, soft and nontender. No organomegaly or masses felt. Normal bowel sounds heard. Central nervous system: Alert and oriented.  Extremities: Symmetric 5 x 5 power.   Data Reviewed: I have personally reviewed following labs and imaging studies  CBC: Recent Labs  Lab 05/18/22 1455 05/19/22 0436  WBC 5.6 6.0  NEUTROABS 3.4 3.2  HGB 12.4* 12.7*  HCT 38.3* 39.0  MCV 87.8 88.8  PLT 252 222   Basic Metabolic Panel: Recent Labs  Lab 05/18/22 1455 05/19/22 0436  NA 138 139  K 4.5 4.4  CL 103 107  CO2 27 25  GLUCOSE 203* 134*  BUN 23* 19  CREATININE 1.52* 1.25*  CALCIUM 10.4* 9.4  MG  --  1.9  PHOS  --  3.4   GFR: Estimated Creatinine Clearance: 86.5 mL/min (A) (by C-G formula based on SCr of 1.25 mg/dL (H)). Liver Function Tests: Recent Labs  Lab 05/18/22 1455 05/19/22 0436  AST 15 15  ALT 12 14  ALKPHOS 53 60  BILITOT 0.6 0.8  PROT 8.0 7.4  ALBUMIN 4.4 3.9   Recent Labs  Lab 05/18/22 1455 05/19/22 0436  LIPASE 489* 166*    No results for input(s): "AMMONIA" in the last 168 hours. Coagulation Profile: No results for input(s): "INR", "PROTIME" in the last 168 hours. Cardiac Enzymes: No results for input(s): "CKTOTAL", "CKMB", "CKMBINDEX", "TROPONINI" in the last 168 hours. BNP (last 3 results) No results for input(s): "PROBNP" in the last 8760 hours. HbA1C: No results for input(s): "HGBA1C" in the last 72 hours. CBG: Recent Labs  Lab 05/18/22 2044  GLUCAP 114*   Lipid Profile: No results for input(s): "CHOL", "HDL", "LDLCALC", "TRIG", "CHOLHDL", "LDLDIRECT" in the last 72 hours. Thyroid Function Tests: No results for input(s): "TSH", "T4TOTAL", "FREET4", "T3FREE", "THYROIDAB" in the last 72 hours. Anemia Panel: No results for input(s): "VITAMINB12", "FOLATE", "FERRITIN", "TIBC", "IRON", "RETICCTPCT" in the last 72 hours. Sepsis Labs: No results for input(s): "PROCALCITON", "LATICACIDVEN" in the last 168 hours.  No results found for this or any previous visit (from the past 240 hour(s)).       Radiology Studies: CT Abdomen Pelvis W Contrast  Result Date: 05/18/2022 CLINICAL DATA:  Abdominal pain with nausea and vomiting for several days. Chills. EXAM: CT ABDOMEN AND PELVIS WITH CONTRAST TECHNIQUE: Multidetector CT imaging of the abdomen and pelvis was performed using the standard protocol following bolus administration of intravenous contrast. RADIATION DOSE REDUCTION: This exam was performed according to the departmental dose-optimization program which includes automated exposure control, adjustment of the mA and/or kV according to patient size and/or use of iterative reconstruction technique. CONTRAST:  OMNIPAQUE IOHEXOL 300 MG/ML  SOLN COMPARISON:  10/10/2020 FINDINGS: Lower Chest: No acute findings. Hepatobiliary: No hepatic masses identified. Gallbladder is unremarkable. No evidence of biliary ductal dilatation. Pancreas:  No mass or inflammatory changes. Spleen: Within normal limits in size  and appearance. Adrenals/Urinary Tract: No masses identified. No evidence of ureteral calculi or hydronephrosis. Unremarkable unopacified urinary bladder. Stomach/Bowel: Mild wall thickening of several small bowel loops is seen in the left abdomen with adjacent mesenteric soft tissue stranding. No evidence of bowel obstruction, pneumatosis, or abscess. Remainder of bowel including the terminal ileum is unremarkable in appearance. Normal appendix visualized. Vascular/Lymphatic: No pathologically enlarged lymph nodes. No acute vascular findings. Aortic atherosclerotic calcification incidentally noted. Reproductive:  No mass or other significant abnormality. Other:  None. Musculoskeletal:  No suspicious bone lesions identified. IMPRESSION: Mild enteritis involving several small bowel loops in left abdomen. No evidence of bowel obstruction, pneumatosis, or abscess. Electronically Signed   By: Danae Orleans M.D.   On: 05/18/2022 16:10        Scheduled Meds:  amLODipine  10 mg Oral Daily   insulin aspart  0-5 Units Subcutaneous QHS   insulin aspart  0-9 Units Subcutaneous TID WC   rosuvastatin  5 mg Oral QHS   senna-docusate  2 tablet Oral QHS   Continuous Infusions:  lactated ringers 125 mL/hr at 05/19/22 0456     LOS: 0 days  Time spent: 35 minutes.     Alba Cory, MD [facility]   If 7PM-7AM, please contact night-coverage [email or web address]  05/19/2022, 7:15 AM

## 2022-05-20 LAB — BASIC METABOLIC PANEL
Anion gap: 6 (ref 5–15)
BUN: 13 mg/dL (ref 6–20)
CO2: 25 mmol/L (ref 22–32)
Calcium: 9.2 mg/dL (ref 8.9–10.3)
Chloride: 108 mmol/L (ref 98–111)
Creatinine, Ser: 1.23 mg/dL (ref 0.61–1.24)
GFR, Estimated: >60 mL/min (ref >60)
Glucose, Bld: 140 mg/dL — ABNORMAL HIGH (ref 70–99)
Potassium: 4.5 mmol/L (ref 3.5–5.1)
Sodium: 139 mmol/L (ref 135–145)

## 2022-05-20 LAB — CBC
HCT: 32.4 % — ABNORMAL LOW (ref 39.0–52.0)
Hemoglobin: 10.8 g/dL — ABNORMAL LOW (ref 13.0–17.0)
MCH: 29.3 pg (ref 26.0–34.0)
MCHC: 33.3 g/dL (ref 30.0–36.0)
MCV: 88 fL (ref 80.0–100.0)
Platelets: 201 10*3/uL (ref 150–400)
RBC: 3.68 MIL/uL — ABNORMAL LOW (ref 4.22–5.81)
RDW: 12.8 % (ref 11.5–15.5)
WBC: 4.6 10*3/uL (ref 4.0–10.5)
nRBC: 0 % (ref 0.0–0.2)

## 2022-05-20 LAB — GLUCOSE, CAPILLARY
Glucose-Capillary: 151 mg/dL — ABNORMAL HIGH (ref 70–99)
Glucose-Capillary: 193 mg/dL — ABNORMAL HIGH (ref 70–99)
Glucose-Capillary: 159 mg/dL — ABNORMAL HIGH (ref 70–99)
Glucose-Capillary: 190 mg/dL — ABNORMAL HIGH (ref 70–99)

## 2022-05-20 LAB — MAGNESIUM: Magnesium: 1.8 mg/dL (ref 1.7–2.4)

## 2022-05-20 MED ORDER — AMLODIPINE BESYLATE 5 MG PO TABS
5.0000 mg | ORAL_TABLET | Freq: Every day | ORAL | Status: DC
  Administered 2022-05-20: 5 mg via ORAL
  Filled 2022-05-20: qty 1

## 2022-05-20 NOTE — Progress Notes (Signed)
PROGRESS NOTE    Paul Roberson  QDI:[id number] DOB: Aug 31, 1971 DOA: 05/18/2022 PCP: Georgina Quint, MD   Brief Narrative: 51 year old with past medical history significant for diabetes type 2, hypertension, hyperlipidemia, obesity presented complaining of nausea vomiting upper abdominal pain radiating to his back for a week.  Reports constipation.  He was found to have elevated lipase, CT with evidence of mild enteritis.  Patient admitted for acute pancreatitis.   Assessment & Plan:   Principal Problem:   Acute pancreatitis  1-Acute pancreatitis: He denies any recent alcohol use.  CT abdomen pelvis: Mild enteritis involving several small bowel loops in left abdomen. No evidence of bowel obstruction, pneumatosis, or abscess. Gallbladder is unremarkable. No evidence of biliary ductal dilatation. Triglycerides: 137, IgG4 Pending. .  Continue with IV fluids.  Advanced die to carb/low fat diet. Discharge home tomorrow if he tolerates diet  Would hold Trulicity at discharge due to adverse effect of pancreatitis.   2-Mild enteritis seen on CT He is asymptomatic.  Denies diarrhea.  Monitor.  He will need follow up with GI>   Mild hypercalcemia: Resolved with IV fluids.   Diabetes type 2: Continue with SSI.   CKD 3A: Creatinine baseline 1.4 Cr peak to 1.5  Continue with IV fluids.  Cr down to 1.2  Polysubstance use: Counseling provided.  He denies drinking alcohol.   Obesity: BMI 32 Needs life style modification.   Hypertension: BP increasing, resume Norvasc.   Hypotension; resolved with IV fluids.   Constipation: Schedule Miralax.     Estimated body mass index is 32.28 kg/m as calculated from the following:   Height as of this encounter: 5\' 10"  (1.778 m).   Weight as of this encounter: 102.1 kg.   DVT prophylaxis: SCD Code Status: Full code Family Communication: Disposition Plan:  Status is: Observation The patient remains OBS appropriate and will  d/c before 2 midnights.    Consultants:  none  Procedures:  none  Antimicrobials:    Subjective: He report improvement of abdominal pain  Had some pain earlier after drinking water.  He was able to eats breakfast without significant pain.    Objective: Vitals:   05/19/22 1628 05/19/22 2230 05/20/22 0452 05/20/22 1207  BP: 117/83 (!) 139/92 (!) 154/95 135/83  Pulse: 85 77 89 89  Resp:  18 18 18   Temp: (!) 97.5 F (36.4 C) 97.7 F (36.5 C) 98.2 F (36.8 C) 98 F (36.7 C)  TempSrc: Oral Oral Oral Oral  SpO2: 99% 100% 99% 100%  Weight:      Height:        Intake/Output Summary (Last 24 hours) at 05/20/2022 1331 Last data filed at 05/20/2022 1322 Gross per 24 hour  Intake 2477.9 ml  Output 1425 ml  Net 1052.9 ml    Filed Weights   05/18/22 1330  Weight: 102.1 kg    Examination:  General exam: NAD Respiratory system: CTA Cardiovascular system: S 1, S 2 RRR Gastrointestinal system: BS present, soft, nt Central nervous system: alert, and oriented.  Extremities: no edema   Data Reviewed: I have personally reviewed following labs and imaging studies  CBC: Recent Labs  Lab 05/18/22 1455 05/19/22 0436 05/20/22 0722  WBC 5.6 6.0 4.6  NEUTROABS 3.4 3.2  --   HGB 12.4* 12.7* 10.8*  HCT 38.3* 39.0 32.4*  MCV 87.8 88.8 88.0  PLT 252 222 201    Basic Metabolic Panel: Recent Labs  Lab 05/18/22 1455 05/19/22 0436 05/20/22 0722  NA 138  139 139  K 4.5 4.4 4.5  CL 103 107 108  CO2 27 25 25   GLUCOSE 203* 134* 140*  BUN 23* 19 13  CREATININE 1.52* 1.25* 1.23  CALCIUM 10.4* 9.4 9.2  MG  --  1.9 1.8  PHOS  --  3.4  --     GFR: Estimated Creatinine Clearance: 87.9 mL/min (by C-G formula based on SCr of 1.23 mg/dL). Liver Function Tests: Recent Labs  Lab 05/18/22 1455 05/19/22 0436  AST 15 15  ALT 12 14  ALKPHOS 53 60  BILITOT 0.6 0.8  PROT 8.0 7.4  ALBUMIN 4.4 3.9    Recent Labs  Lab 05/18/22 1455 05/19/22 0436  LIPASE 489* 166*    No  results for input(s): "AMMONIA" in the last 168 hours. Coagulation Profile: No results for input(s): "INR", "PROTIME" in the last 168 hours. Cardiac Enzymes: No results for input(s): "CKTOTAL", "CKMB", "CKMBINDEX", "TROPONINI" in the last 168 hours. BNP (last 3 results) No results for input(s): "PROBNP" in the last 8760 hours. HbA1C: Recent Labs    05/19/22 0436  HGBA1C 10.1*   CBG: Recent Labs  Lab 05/19/22 1105 05/19/22 1630 05/19/22 2228 05/20/22 0717 05/20/22 1105  GLUCAP 150* 169* 159* 151* 193*    Lipid Profile: Recent Labs    05/19/22 0943  TRIG 137   Thyroid Function Tests: No results for input(s): "TSH", "T4TOTAL", "FREET4", "T3FREE", "THYROIDAB" in the last 72 hours. Anemia Panel: No results for input(s): "VITAMINB12", "FOLATE", "FERRITIN", "TIBC", "IRON", "RETICCTPCT" in the last 72 hours. Sepsis Labs: No results for input(s): "PROCALCITON", "LATICACIDVEN" in the last 168 hours.  No results found for this or any previous visit (from the past 240 hour(s)).       Radiology Studies: CT Abdomen Pelvis W Contrast  Result Date: 05/18/2022 CLINICAL DATA:  Abdominal pain with nausea and vomiting for several days. Chills. EXAM: CT ABDOMEN AND PELVIS WITH CONTRAST TECHNIQUE: Multidetector CT imaging of the abdomen and pelvis was performed using the standard protocol following bolus administration of intravenous contrast. RADIATION DOSE REDUCTION: This exam was performed according to the departmental dose-optimization program which includes automated exposure control, adjustment of the mA and/or kV according to patient size and/or use of iterative reconstruction technique. CONTRAST:  07/19/2022 OMNIPAQUE IOHEXOL 300 MG/ML  SOLN COMPARISON:  10/10/2020 FINDINGS: Lower Chest: No acute findings. Hepatobiliary: No hepatic masses identified. Gallbladder is unremarkable. No evidence of biliary ductal dilatation. Pancreas:  No mass or inflammatory changes. Spleen: Within normal  limits in size and appearance. Adrenals/Urinary Tract: No masses identified. No evidence of ureteral calculi or hydronephrosis. Unremarkable unopacified urinary bladder. Stomach/Bowel: Mild wall thickening of several small bowel loops is seen in the left abdomen with adjacent mesenteric soft tissue stranding. No evidence of bowel obstruction, pneumatosis, or abscess. Remainder of bowel including the terminal ileum is unremarkable in appearance. Normal appendix visualized. Vascular/Lymphatic: No pathologically enlarged lymph nodes. No acute vascular findings. Aortic atherosclerotic calcification incidentally noted. Reproductive:  No mass or other significant abnormality. Other:  None. Musculoskeletal:  No suspicious bone lesions identified. IMPRESSION: Mild enteritis involving several small bowel loops in left abdomen. No evidence of bowel obstruction, pneumatosis, or abscess. Electronically Signed   By: 10/12/2020 M.D.   On: 05/18/2022 16:10        Scheduled Meds:  amLODipine  5 mg Oral Daily   insulin aspart  0-5 Units Subcutaneous QHS   insulin aspart  0-9 Units Subcutaneous TID WC   polyethylene glycol  17 g Oral Daily  rosuvastatin  5 mg Oral QHS   senna-docusate  2 tablet Oral QHS   Continuous Infusions:  lactated ringers Stopped (05/20/22 1314)     LOS: 1 day    Time spent: 35 minutes.     Alba Cory, MD [facility]   If 7PM-7AM, please contact night-coverage [email or web address]  05/20/2022, 1:31 PM

## 2022-05-21 ENCOUNTER — Encounter (HOSPITAL_COMMUNITY): Payer: Self-pay | Admitting: *Deleted

## 2022-05-21 DIAGNOSIS — K529 Noninfective gastroenteritis and colitis, unspecified: Secondary | ICD-10-CM

## 2022-05-21 DIAGNOSIS — K859 Acute pancreatitis without necrosis or infection, unspecified: Secondary | ICD-10-CM | POA: Diagnosis not present

## 2022-05-21 LAB — GLUCOSE, CAPILLARY
Glucose-Capillary: 199 mg/dL — ABNORMAL HIGH (ref 70–99)
Glucose-Capillary: 123 mg/dL — ABNORMAL HIGH (ref 70–99)

## 2022-05-21 MED ORDER — GLIPIZIDE 10 MG PO TABS
5.0000 mg | ORAL_TABLET | Freq: Two times a day (BID) | ORAL | Status: DC
  Administered 2022-05-21: 5 mg via ORAL
  Filled 2022-05-21: qty 1

## 2022-05-21 MED ORDER — METFORMIN HCL 500 MG PO TABS
1000.0000 mg | ORAL_TABLET | Freq: Two times a day (BID) | ORAL | Status: DC
  Administered 2022-05-21: 1000 mg via ORAL
  Filled 2022-05-21: qty 2

## 2022-05-21 MED ORDER — TRULICITY 1.5 MG/0.5ML ~~LOC~~ SOAJ: 1.5000 mg | SUBCUTANEOUS | Status: DC

## 2022-05-21 MED ORDER — OXYCODONE HCL 5 MG PO TABS: 5.0000 mg | ORAL_TABLET | Freq: Four times a day (QID) | ORAL | Status: DC | PRN

## 2022-05-21 MED ORDER — INSULIN DETEMIR 100 UNIT/ML ~~LOC~~ SOLN
10.0000 [IU] | Freq: Every day | SUBCUTANEOUS | Status: DC
Start: 2022-05-21 — End: 2022-05-21
  Administered 2022-05-21: 10 [IU] via SUBCUTANEOUS
  Filled 2022-05-21: qty 0.1

## 2022-05-21 MED ORDER — AMLODIPINE BESYLATE 10 MG PO TABS
10.0000 mg | ORAL_TABLET | Freq: Every day | ORAL | Status: DC
  Administered 2022-05-21: 10 mg via ORAL
  Filled 2022-05-21: qty 1

## 2022-05-21 MED ORDER — LISINOPRIL 20 MG PO TABS
40.0000 mg | ORAL_TABLET | Freq: Every morning | ORAL | Status: DC
  Administered 2022-05-21: 40 mg via ORAL
  Filled 2022-05-21: qty 2

## 2022-05-21 NOTE — Discharge Summary (Signed)
DISCHARGE SUMMARY  Paul Roberson  MR#: [id number]  DOB:Jul 16, 1971  Date of Admission: 05/18/2022 Date of Discharge: 05/21/2022  Attending Physician:Mandrell Vangilder Silvestre Gunner, MD  Patient's UXL:[id number], Eilleen Kempf, MD  Consults: none   Disposition: D/C home   Follow-up Appts:  Follow-up Information     Georgina Quint, MD Follow up in 1 week(s).   Specialty: Internal Medicine Contact information: [address] [place] [address] [phone]                 Tests Needing Follow-up: -Assess CBG controlled -Assess for recurrent pancreatitis symptoms -Check anemia panel to investigate low-grade anemia -Assess blood pressure control  Discharge Diagnoses: Acute pancreatitis Mild enteritis on CT Mild hypercalcemia DM2 uncontrolled with mild hyperglycemia CKD stage IIIa Normocytic anemia Tobacco abuse Obesity - Body mass index is 32.28 kg/m. HTN Hyperlipidemia Constipation  Initial presentation: 51 year old with a history of DM2, HTN, HLD, and obesity who presented to the ER with complaints of nausea vomiting and upper abdominal pain radiating to his back for a week.  He was found to have an elevated lipase with CT revealing mild enteritis.  Hospital Course:  Acute pancreatitis Denies heavy habitual alcohol use -no evidence of gallbladder disease on CT abdomen -triglycerides 137 -has been successfully advanced to low-fat diet - has been on Trulicity for > 1year, but could be related - discussed w/ patient - he strongly wishes to cont Trulicity which I don't think is unreasonable - will resume in 48hrs at home - avoid all alchohol (pt counseled)    Mild enteritis on CT No diarrhea/asymptomatic - no further w/u needed    Mild hypercalcemia Resolved with volume resuscitation   DM2 uncontrolled with mild hyperglycemia CBG consistently 190+ - A1c 10.1 - pt counseled on absolute need for strict dietary compliance - pt counseled on need for weight loss and  exercise - as noted above, will resume Trulicity in 48hrs, but if suffers recurrent pancreatitis this will simply have to be stopped - resume usual oral DM meds - will require further titration of meds as outpt if diet and exercise do not lead to improved control    CKD stage IIIa Baseline creatinine 1.4 -no significant change in renal function during this hospital stay   Normocytic anemia Possibly a consequence of CKD -will need to have anemia panel checked in outpatient setting -no evidence of blood loss during this hospital stay   Tobacco abuse has been counseled on need to discontinue tobacco abuse immediately   Obesity - Body mass index is 32.28 kg/m.   HTN We will need to be followed in the outpatient setting  Hyperlipidemia Continue usual Crestor   Constipation Treated with MiraLAX  Allergies as of 05/21/2022       Reactions   Sulfa Antibiotics Other (See Comments)   Causes real bad headache   Penicillins Itching        Medication List     TAKE these medications    amLODipine 10 MG tablet Commonly known as: NORVASC Take 1 tablet (10 mg total) by mouth daily. What changed: when to take this   BC HEADACHE POWDER PO Take 1 packet by mouth 2 (two) times daily as needed (pain/headache).   FreeStyle [place] 2 [employer] Dose,Route,Frequency: As Directed   [employer] 2 Sensor Misc Dose, Route Frequency: As Directed   glipiZIDE 5 MG tablet Commonly known as: GLUCOTROL Take 1 tablet (5 mg total) by mouth 2 (two) times daily before a meal. What changed:  when to take this   lisinopril 40 MG tablet Commonly known as: ZESTRIL Take 1 tablet (40 mg total) by mouth daily. What changed: when to take this   metFORMIN 1000 MG tablet Commonly known as: GLUCOPHAGE Take 1 tablet (1,000 mg total) by mouth 2 (two) times daily with a meal.   rosuvastatin 5 MG tablet Commonly known as: CRESTOR Take 1 tablet (5 mg total) by mouth at bedtime.   Trulicity 1.5  MG/0.5ML Sopn Generic drug: Dulaglutide Inject 1.5 mg into the skin once a week. Start taking on: May 23, 2022        Day of Discharge BP (!) 163/97 (BP Location: Left Arm)   Pulse 98   Temp 97.8 F (36.6 C)   Resp 18   Ht 5\' 10"  (1.778 m)   Wt 102.1 kg   SpO2 98%   BMI 32.28 kg/m   Physical Exam: General: No acute respiratory distress Lungs: Clear to auscultation bilaterally without wheezes or crackles Cardiovascular: Regular rate and rhythm without murmur gallop or rub normal S1 and S2 Abdomen: Nontender, nondistended, soft, bowel sounds positive, no rebound, no ascites, no appreciable mass Extremities: No significant cyanosis, clubbing, or edema bilateral lower extremities  Basic Metabolic Panel: Recent Labs  Lab 05/18/22 1455 05/19/22 0436 05/20/22 0722  NA 138 139 139  K 4.5 4.4 4.5  CL 103 107 108  CO2 27 25 25   GLUCOSE 203* 134* 140*  BUN 23* 19 13  CREATININE 1.52* 1.25* 1.23  CALCIUM 10.4* 9.4 9.2  MG  --  1.9 1.8  PHOS  --  3.4  --     Liver Function Tests: Recent Labs  Lab 05/18/22 1455 05/19/22 0436  AST 15 15  ALT 12 14  ALKPHOS 53 60  BILITOT 0.6 0.8  PROT 8.0 7.4  ALBUMIN 4.4 3.9   Recent Labs  Lab 05/18/22 1455 05/19/22 0436  LIPASE 489* 166*    CBC: Recent Labs  Lab 05/18/22 1455 05/19/22 0436 05/20/22 0722  WBC 5.6 6.0 4.6  NEUTROABS 3.4 3.2  --   HGB 12.4* 12.7* 10.8*  HCT 38.3* 39.0 32.4*  MCV 87.8 88.8 88.0  PLT 252 222 201    CBG: Recent Labs  Lab 05/20/22 1105 05/20/22 1614 05/20/22 2137 05/21/22 0710 05/21/22 1127  GLUCAP 193* 159* 190* 199* 123*    Time spent in discharge (includes decision making & examination of pt): 35 minutes  05/21/2022, 4:16 PM   07/22/22, MD [facility] Office  [phone]

## 2022-05-21 NOTE — Progress Notes (Signed)
   05/21/22   To Whom it may concern,  Paul Roberson was hospitalized at [facility] from 05/18/2022 through 05/21/22. He has been cleared to return to work on but not before 05/22/2022, with no medical restrictions.    Should you have further questions please feel free to contact our office.  Be advised that no medical information will be divulged without a signed HIPA release from the patient.    Sincerely,   Lonia Blood, MD [facility] Office  [phone]

## 2022-05-21 NOTE — Progress Notes (Signed)
Nurse reviewed discharge instructions with pt.  Pt verbalized understanding of discharge instructions, follow up appointments and new medications.  No concerns at time of discharge. 

## 2022-05-22 LAB — IGG 4: IgG, Subclass 4: 70 mg/dL (ref 2–96)

## 2022-05-26 ENCOUNTER — Telehealth: Payer: Self-pay | Admitting: *Deleted

## 2022-05-26 NOTE — Patient Outreach (Signed)
  Care Coordination TOC Note Transition Care Management Unsuccessful Follow-up Telephone Call  Date of discharge and from where:  05/21/22 FROM [facility]  Attempts:  1st Attempt  Reason for unsuccessful TCM follow-up call:  Left voice message  Rhae Lerner RN, MSN RN Care Management Coordinator  [phone] [email or web address]

## 2022-05-27 ENCOUNTER — Other Ambulatory Visit: Payer: Self-pay | Admitting: Emergency Medicine

## 2022-05-27 ENCOUNTER — Encounter: Payer: Self-pay | Admitting: Emergency Medicine

## 2022-05-27 ENCOUNTER — Ambulatory Visit (INDEPENDENT_AMBULATORY_CARE_PROVIDER_SITE_OTHER): Payer: BC Managed Care – PPO | Admitting: Emergency Medicine

## 2022-05-27 VITALS — BP 130/70 | HR 104 | Temp 98.6°F | Ht 70.0 in | Wt 213.0 lb

## 2022-05-27 DIAGNOSIS — K859 Acute pancreatitis without necrosis or infection, unspecified: Secondary | ICD-10-CM | POA: Diagnosis not present

## 2022-05-27 DIAGNOSIS — E1165 Type 2 diabetes mellitus with hyperglycemia: Secondary | ICD-10-CM

## 2022-05-27 DIAGNOSIS — D649 Anemia, unspecified: Secondary | ICD-10-CM | POA: Insufficient documentation

## 2022-05-27 DIAGNOSIS — E1159 Type 2 diabetes mellitus with other circulatory complications: Secondary | ICD-10-CM

## 2022-05-27 DIAGNOSIS — I152 Hypertension secondary to endocrine disorders: Secondary | ICD-10-CM

## 2022-05-27 DIAGNOSIS — Z09 Encounter for follow-up examination after completed treatment for conditions other than malignant neoplasm: Secondary | ICD-10-CM | POA: Diagnosis not present

## 2022-05-27 LAB — CBC WITH DIFFERENTIAL/PLATELET
Basophils Absolute: 0 10*3/uL (ref 0.0–0.1)
Basophils Relative: 0.4 % (ref 0.0–3.0)
Eosinophils Absolute: 0.1 10*3/uL (ref 0.0–0.7)
Eosinophils Relative: 1.8 % (ref 0.0–5.0)
HCT: 36 % — ABNORMAL LOW (ref 39.0–52.0)
Hemoglobin: 11.9 g/dL — ABNORMAL LOW (ref 13.0–17.0)
Lymphocytes Relative: 37.7 % (ref 12.0–46.0)
Lymphs Abs: 2.2 10*3/uL (ref 0.7–4.0)
MCHC: 33 g/dL (ref 30.0–36.0)
MCV: 87.6 fl (ref 78.0–100.0)
Monocytes Absolute: 0.5 10*3/uL (ref 0.1–1.0)
Monocytes Relative: 9.5 % (ref 3.0–12.0)
Neutro Abs: 2.9 10*3/uL (ref 1.4–7.7)
Neutrophils Relative %: 50.6 % (ref 43.0–77.0)
Platelets: 229 10*3/uL (ref 150.0–400.0)
RBC: 4.11 Mil/uL — ABNORMAL LOW (ref 4.22–5.81)
RDW: 14.5 % (ref 11.5–15.5)
WBC: 5.7 10*3/uL (ref 4.0–10.5)

## 2022-05-27 LAB — LIPASE: Lipase: 184 U/L — ABNORMAL HIGH (ref 11.0–59.0)

## 2022-05-27 LAB — COMPREHENSIVE METABOLIC PANEL
ALT: 21 U/L (ref 0–53)
AST: 22 U/L (ref 0–37)
Albumin: 4.5 g/dL (ref 3.5–5.2)
Alkaline Phosphatase: 69 U/L (ref 39–117)
BUN: 22 mg/dL (ref 6–23)
CO2: 27 mEq/L (ref 19–32)
Calcium: 9.9 mg/dL (ref 8.4–10.5)
Chloride: 103 mEq/L (ref 96–112)
Creatinine, Ser: 1.48 mg/dL (ref 0.40–1.50)
GFR: 55.43 mL/min — ABNORMAL LOW (ref >60.00)
Glucose, Bld: 247 mg/dL — ABNORMAL HIGH (ref 70–99)
Potassium: 4.3 mEq/L (ref 3.5–5.1)
Sodium: 137 mEq/L (ref 135–145)
Total Bilirubin: 0.4 mg/dL (ref 0.2–1.2)
Total Protein: 7.7 g/dL (ref 6.0–8.3)

## 2022-05-27 MED ORDER — TRULICITY 1.5 MG/0.5ML ~~LOC~~ SOAJ: 1.5000 mg | SUBCUTANEOUS | 3 refills | Status: DC

## 2022-05-27 MED ORDER — ROSUVASTATIN CALCIUM 5 MG PO TABS: 5.0000 mg | ORAL_TABLET | Freq: Every day | ORAL | 3 refills | Status: DC

## 2022-05-27 MED ORDER — DAPAGLIFLOZIN PROPANEDIOL 10 MG PO TABS: 10.0000 mg | ORAL_TABLET | Freq: Every day | ORAL | 3 refills | Status: DC

## 2022-05-27 NOTE — Progress Notes (Signed)
Paul Roberson 51 y.o.   Chief Complaint  Patient presents with   Hospitalization Follow-up   Heartburn    Severe heart burn     HISTORY OF PRESENT ILLNESS: This is a 51 y.o. male here for hospital discharge follow-up. Feeling better.  Denies abdominal pain. Diabetic.  Asking when can he go back to Trulicity.  Hospital discharge summary as follows: Paul Roberson   MR#: [id number]   DOB:[birth year]  Date of Admission: 05/18/2022 Date of Discharge: 05/21/2022   Attending Physician:Jeffrey Silvestre Gunner, MD   Patient's ZLD:[id number], Eilleen Kempf, MD   Consults: none    Disposition: D/C home    Follow-up Appts:   Follow-up Information       Georgina Quint, MD Follow up in 1 week(s).   Specialty: Internal Medicine Contact information: [address] [place] [address] [phone]                          Tests Needing Follow-up: -Assess CBG controlled -Assess for recurrent pancreatitis symptoms -Check anemia panel to investigate low-grade anemia -Assess blood pressure control   Discharge Diagnoses: Acute pancreatitis Mild enteritis on CT Mild hypercalcemia DM2 uncontrolled with mild hyperglycemia CKD stage IIIa Normocytic anemia Tobacco abuse Obesity - Body mass index is 32.28 kg/m. HTN Hyperlipidemia Constipation   Initial presentation: 51 year old with a history of DM2, HTN, HLD, and obesity who presented to the ER with complaints of nausea vomiting and upper abdominal pain radiating to his back for a week.  He was found to have an elevated lipase with CT revealing mild enteritis.   Hospital Course:   Acute pancreatitis Denies heavy habitual alcohol use -no evidence of gallbladder disease on CT abdomen -triglycerides 137 -has been successfully advanced to low-fat diet - has been on Trulicity for > 1year, but could be related - discussed w/ patient - he strongly wishes to cont Trulicity which I don't think is unreasonable - will resume in 48hrs at  home - avoid all alchohol (pt counseled)    Mild enteritis on CT No diarrhea/asymptomatic - no further w/u needed    Mild hypercalcemia Resolved with volume resuscitation   DM2 uncontrolled with mild hyperglycemia CBG consistently 190+ - A1c 10.1 - pt counseled on absolute need for strict dietary compliance - pt counseled on need for weight loss and exercise - as noted above, will resume Trulicity in 48hrs, but if suffers recurrent pancreatitis this will simply have to be stopped - resume usual oral DM meds - will require further titration of meds as outpt if diet and exercise do not lead to improved control    CKD stage IIIa Baseline creatinine 1.4 -no significant change in renal function during this hospital stay   Normocytic anemia Possibly a consequence of CKD -will need to have anemia panel checked in outpatient setting -no evidence of blood loss during this hospital stay   Tobacco abuse has been counseled on need to discontinue tobacco abuse immediately   Obesity - Body mass index is 32.28 kg/m.   HTN We will need to be followed in the outpatient setting   Hyperlipidemia Continue usual Crestor   Constipation Treated with MiraLAX    Heartburn He complains of heartburn. He reports no abdominal pain, no chest pain, no coughing, no nausea or no sore throat. Pertinent negatives include no melena.     Prior to Admission medications   Medication Sig Start Date End Date Taking? Authorizing Provider  amLODipine (  NORVASC) 10 MG tablet Take 1 tablet (10 mg total) by mouth daily. Patient taking differently: Take 10 mg by mouth every morning. 03/12/22  Yes Alaa Mullally, Eilleen Kempf, MD  Continuous Blood Gluc Receiver (FREESTYLE LIBRE 2 READER) DEVI Dose,Route,Frequency: As Directed 03/14/22  Yes Corine Solorio, Eilleen Kempf, MD  Continuous Blood Gluc Sensor (FREESTYLE LIBRE 2 SENSOR) MISC Dose, Route Frequency: As Directed 03/14/22  Yes Makaiya Geerdes, Eilleen Kempf, MD  glipiZIDE (GLUCOTROL) 5 MG  tablet Take 1 tablet (5 mg total) by mouth 2 (two) times daily before a meal. Patient taking differently: Take 5 mg by mouth every morning. 03/12/22  Yes Averyana Pillars, Eilleen Kempf, MD  lisinopril (ZESTRIL) 40 MG tablet Take 1 tablet (40 mg total) by mouth daily. Patient taking differently: Take 40 mg by mouth every morning. 03/12/22  Yes Suad Autrey, Eilleen Kempf, MD  Aspirin-Salicylamide-Caffeine [facility] HEADACHE POWDER PO) Take 1 packet by mouth 2 (two) times daily as needed (pain/headache). Patient not taking: Reported on 05/27/2022    [provider]  Dulaglutide (TRULICITY) 1.5 MG/0.5ML SOPN Inject 1.5 mg into the skin once a week. 05/27/22   Georgina Quint, MD  metFORMIN (GLUCOPHAGE) 1000 MG tablet Take 1 tablet (1,000 mg total) by mouth 2 (two) times daily with a meal. Patient not taking: Reported on 05/27/2022 03/12/22   Georgina Quint, MD  rosuvastatin (CRESTOR) 5 MG tablet Take 1 tablet (5 mg total) by mouth at bedtime. Patient not taking: Reported on 05/19/2022 07/11/21 10/09/21  Georgina Quint, MD    Allergies  Allergen Reactions   Sulfa Antibiotics Other (See Comments)    Causes real bad headache   Penicillins Itching    Patient Active Problem List   Diagnosis Date Noted   Acute pancreatitis 05/18/2022   Hypertension associated with type 2 diabetes mellitus (HCC) 05/28/2017   Type 2 diabetes mellitus without complication, without long-term current use of insulin (HCC) 05/28/2017    Past Medical History:  Diagnosis Date   Diabetes mellitus without complication (HCC)    Hypertension     History reviewed. No pertinent surgical history.  Social History   Socioeconomic History   Marital status: Married    Spouse name: Not on file   Number of children: Not on file   Years of education: Not on file   Highest education level: Not on file  Occupational History   Not on file  Tobacco Use   Smoking status: Some Days   Smokeless tobacco: Never  Vaping Use    Vaping Use: Never used  Substance and Sexual Activity   Alcohol use: Yes   Drug use: No   Sexual activity: Yes  Other Topics Concern   Not on file  Social History Narrative   Not on file   Social Determinants of Health   Financial Resource Strain: Not on file  Food Insecurity: Not on file  Transportation Needs: Not on file  Physical Activity: Not on file  Stress: Not on file  Social Connections: Not on file  Intimate Partner Violence: Not on file    Family History  Problem Relation Age of Onset   Diabetes Mother    Heart disease Mother    Hypertension Mother    Asthma Mother    Diabetes Father    Heart disease Father    Hypertension Father    Kidney disease Father    Cancer Father      Review of Systems  Constitutional: Negative.  Negative for chills and fever.  HENT: Negative.  Negative for congestion and sore throat.   Respiratory: Negative.  Negative for cough and shortness of breath.   Cardiovascular: Negative.  Negative for chest pain and palpitations.  Gastrointestinal:  Positive for heartburn. Negative for abdominal pain, blood in stool, melena, nausea and vomiting.  Genitourinary: Negative.  Negative for dysuria and hematuria.  Skin: Negative.  Negative for rash.  Neurological:  Negative for dizziness and headaches.  All other systems reviewed and are negative.  Today's Vitals   05/27/22 1439 05/27/22 1651  BP: 140/86 130/70  Pulse: (!) 104   Temp: 98.6 F (37 C)   TempSrc: Oral   SpO2: 96%   Weight: 213 lb (96.6 kg)   Height: 5\' 10"  (1.778 m)    Body mass index is 30.56 kg/m.   Physical Exam Vitals reviewed.  Constitutional:      Appearance: Normal appearance.  HENT:     Head: Normocephalic.     Mouth/Throat:     Mouth: Mucous membranes are moist.     Pharynx: Oropharynx is clear.  Eyes:     Extraocular Movements: Extraocular movements intact.     Pupils: Pupils are equal, round, and reactive to light.  Cardiovascular:     Rate and  Rhythm: Normal rate and regular rhythm.     Pulses: Normal pulses.     Heart sounds: Normal heart sounds.  Pulmonary:     Effort: Pulmonary effort is normal.     Breath sounds: Normal breath sounds.  Abdominal:     General: There is no distension.     Palpations: Abdomen is soft.     Tenderness: There is no abdominal tenderness.  Musculoskeletal:     Cervical back: No tenderness.     Right lower leg: No edema.     Left lower leg: No edema.  Lymphadenopathy:     Cervical: No cervical adenopathy.  Skin:    General: Skin is warm and dry.     Capillary Refill: Capillary refill takes less than 2 seconds.  Neurological:     General: No focal deficit present.     Mental Status: He is alert and oriented to person, place, and time.  Psychiatric:        Mood and Affect: Mood normal.        Behavior: Behavior normal.      ASSESSMENT & PLAN: A total of 50 minutes was spent with the patient and counseling/coordination of care regarding preparing for this visit, review of most recent office visit notes, review of hospitalist discharge summary, review of most recent blood work results, review of all medications and changes made, review of multiple chronic medical problems and their management, education on nutrition, prognosis, documentation and need for follow-up.  Problem List Items Addressed This Visit       Cardiovascular and Mediastinum   Hypertension associated with type 2 diabetes mellitus (HCC)    Well-controlled hypertension with normal blood pressure readings at home. BP Readings from Last 3 Encounters:  05/27/22 140/86  05/21/22 (!) 163/97  03/12/22 (!) 142/98  Continue lisinopril 40 mg and amlodipine 10 mg daily. Uncontrolled diabetes with last hemoglobin A1c at 10.1. Lab Results  Component Value Date   HGBA1C 10.1 (H) 05/19/2022  Continue metformin 1000 mg and glipizide 5 mg twice a day Restart Trulicity 1.5 mg weekly.  We will watch out for symptoms of pancreatitis.   Advised to contact the office if symptoms return.  Patient has been on Trulicity for about a year without any  problems. Diet and nutrition discussed. Cardiovascular risks associated with uncontrolled diabetes discussed.         Relevant Medications   Dulaglutide (TRULICITY) 1.5 MG/0.5ML SOPN   rosuvastatin (CRESTOR) 5 MG tablet   Other Relevant Orders   CBC with Differential/Platelet   Lipase   Comprehensive metabolic panel     Digestive   Acute pancreatitis    Resolved.  May be secondary to Trulicity even though he has been on it for about a year. Reasonable to restart it and monitor symptoms.         Other   Chronic anemia    Needs a work-up.  Anemia panel ordered today.      Relevant Orders   Vitamin B12   Folate   Iron and TIBC   Ferritin   Other Visit Diagnoses     Hospital discharge follow-up    -  Primary      Patient Instructions  Diabetes Mellitus and Nutrition, Adult When you have diabetes, or diabetes mellitus, it is very important to have healthy eating habits because your blood sugar (glucose) levels are greatly affected by what you eat and drink. Eating healthy foods in the right amounts, at about the same times every day, can help you: Manage your blood glucose. Lower your risk of heart disease. Improve your blood pressure. Reach or maintain a healthy weight. What can affect my meal plan? Every person with diabetes is different, and each person has different needs for a meal plan. Your health care provider may recommend that you work with a dietitian to make a meal plan that is best for you. Your meal plan may vary depending on factors such as: The calories you need. The medicines you take. Your weight. Your blood glucose, blood pressure, and cholesterol levels. Your activity level. Other health conditions you have, such as heart or kidney disease. How do carbohydrates affect me? Carbohydrates, also called carbs, affect your blood glucose level  more than any other type of food. Eating carbs raises the amount of glucose in your blood. It is important to know how many carbs you can safely have in each meal. This is different for every person. Your dietitian can help you calculate how many carbs you should have at each meal and for each snack. How does alcohol affect me? Alcohol can cause a decrease in blood glucose (hypoglycemia), especially if you use insulin or take certain diabetes medicines by mouth. Hypoglycemia can be a life-threatening condition. Symptoms of hypoglycemia, such as sleepiness, dizziness, and confusion, are similar to symptoms of having too much alcohol. Do not drink alcohol if: Your health care provider tells you not to drink. You are pregnant, may be pregnant, or are planning to become pregnant. If you drink alcohol: Limit how much you have to: 0-1 drink a day for women. 0-2 drinks a day for men. Know how much alcohol is in your drink. In the [place] one drink equals one 12 oz bottle of beer (355 mL), one 5 oz glass of wine (148 mL), or one 1 oz glass of hard liquor (44 mL). Keep yourself hydrated with water, diet soda, or unsweetened iced tea. Keep in mind that regular soda, juice, and other mixers may contain a lot of sugar and must be counted as carbs. What are tips for following this plan?  Reading food labels Start by checking the serving size on the Nutrition Facts label of packaged foods and drinks. The number of calories and the amount of  carbs, fats, and other nutrients listed on the label are based on one serving of the item. Many items contain more than one serving per package. Check the total grams (g) of carbs in one serving. Check the number of grams of saturated fats and trans fats in one serving. Choose foods that have a low amount or none of these fats. Check the number of milligrams (mg) of salt (sodium) in one serving. Most people should limit total sodium intake to less than 2,300 mg per  day. Always check the nutrition information of foods labeled as "low-fat" or "nonfat." These foods may be higher in added sugar or refined carbs and should be avoided. Talk to your dietitian to identify your daily goals for nutrients listed on the label. Shopping Avoid buying canned, pre-made, or processed foods. These foods tend to be high in fat, sodium, and added sugar. Shop around the outside edge of the grocery store. This is where you will most often find fresh fruits and vegetables, bulk grains, fresh meats, and fresh dairy products. Cooking Use low-heat cooking methods, such as baking, instead of high-heat cooking methods, such as deep frying. Cook using healthy oils, such as olive, canola, or sunflower oil. Avoid cooking with butter, cream, or high-fat meats. Meal planning Eat meals and snacks regularly, preferably at the same times every day. Avoid going long periods of time without eating. Eat foods that are high in fiber, such as fresh fruits, vegetables, beans, and whole grains. Eat 4-6 oz (112-168 g) of lean protein each day, such as lean meat, chicken, fish, eggs, or tofu. One ounce (oz) (28 g) of lean protein is equal to: 1 oz (28 g) of meat, chicken, or fish. 1 egg.  cup (62 g) of tofu. Eat some foods each day that contain healthy fats, such as avocado, nuts, seeds, and fish. What foods should I eat? Fruits Berries. Apples. Oranges. Peaches. Apricots. Plums. Grapes. Mangoes. Papayas. Pomegranates. Kiwi. Cherries. Vegetables Leafy greens, including lettuce, spinach, kale, chard, collard greens, mustard greens, and cabbage. Beets. Cauliflower. Broccoli. Carrots. Worrell beans. Tomatoes. Peppers. Onions. Cucumbers. Brussels sprouts. Grains Whole grains, such as whole-wheat or whole-grain bread, crackers, tortillas, cereal, and pasta. Unsweetened oatmeal. Quinoa. Brown or wild rice. Meats and other proteins Seafood. Poultry without skin. Lean cuts of poultry and beef. Tofu.  Nuts. Seeds. Dairy Low-fat or fat-free dairy products such as milk, yogurt, and cheese. The items listed above may not be a complete list of foods and beverages you can eat and drink. Contact a dietitian for more information. What foods should I avoid? Fruits Fruits canned with syrup. Vegetables Canned vegetables. Frozen vegetables with butter or cream sauce. Grains Refined white flour and flour products such as bread, pasta, snack foods, and cereals. Avoid all processed foods. Meats and other proteins Fatty cuts of meat. Poultry with skin. Breaded or fried meats. Processed meat. Avoid saturated fats. Dairy Full-fat yogurt, cheese, or milk. Beverages Sweetened drinks, such as soda or iced tea. The items listed above may not be a complete list of foods and beverages you should avoid. Contact a dietitian for more information. Questions to ask a health care provider Do I need to meet with a certified diabetes care and education specialist? Do I need to meet with a dietitian? What number can I call if I have questions? When are the best times to check my blood glucose? Where to find more information: American Diabetes Association: diabetes.org Academy of Nutrition and Dietetics: eatright.[employer] of Diabetes  and Digestive and Kidney Diseases: StageSync.si Association of Diabetes Care & Education Specialists: diabeteseducator.org Summary It is important to have healthy eating habits because your blood sugar (glucose) levels are greatly affected by what you eat and drink. It is important to use alcohol carefully. A healthy meal plan will help you manage your blood glucose and lower your risk of heart disease. Your health care provider may recommend that you work with a dietitian to make a meal plan that is best for you. This information is not intended to replace advice given to you by your health care provider. Make sure you discuss any questions you have with your health  care provider. Document Revised: 06/06/2020 Document Reviewed: 06/06/2020 Elsevier Patient Education  2023 Elsevier Inc.    Edwina Barth, MD [facility]

## 2022-05-27 NOTE — Patient Instructions (Signed)

## 2022-05-27 NOTE — Assessment & Plan Note (Signed)
Well-controlled hypertension with normal blood pressure readings at home. BP Readings from Last 3 Encounters:  05/27/22 140/86  05/21/22 (!) 163/97  03/12/22 (!) 142/98  Continue lisinopril 40 mg and amlodipine 10 mg daily. Uncontrolled diabetes with last hemoglobin A1c at 10.1. Lab Results  Component Value Date   HGBA1C 10.1 (H) 05/19/2022  Continue metformin 1000 mg and glipizide 5 mg twice a day Restart Trulicity 1.5 mg weekly.  We will watch out for symptoms of pancreatitis.  Advised to contact the office if symptoms return.  Patient has been on Trulicity for about a year without any problems. Diet and nutrition discussed. Cardiovascular risks associated with uncontrolled diabetes discussed.

## 2022-05-27 NOTE — Assessment & Plan Note (Signed)
Resolved.  May be secondary to Trulicity even though he has been on it for about a year. Reasonable to restart it and monitor symptoms.

## 2022-05-27 NOTE — Assessment & Plan Note (Signed)
Needs a work-up.  Anemia panel ordered today.

## 2022-05-28 ENCOUNTER — Other Ambulatory Visit: Payer: Self-pay | Admitting: *Deleted

## 2022-05-28 NOTE — Patient Outreach (Signed)
  Care Coordination [facility] Note Transition Care Management Unsuccessful Follow-up Telephone Call  Date of discharge and from where:  [phone]  Attempts:  2nd Attempt  Reason for unsuccessful TCM follow-up call:  Left voice message Patient has followed up with PCP on [phone]  Gean Maidens BSN RN [facility] Care Management [phone]

## 2022-06-05 ENCOUNTER — Ambulatory Visit: Payer: Self-pay | Admitting: *Deleted

## 2022-06-05 NOTE — Patient Outreach (Signed)
  Care Coordination TOC Note Transition Care Management Follow-up Telephone Call Date of discharge and from where: 05/21/22 FROM [facility] How have you been since you were released from the hospital? BEEN ALRIGHT Any questions or concerns? No  Items Reviewed: Did the pt receive and understand the discharge instructions provided? Yes  Medications obtained and verified? No , HAS NOT OBTAIN TRULICITY DUE COST HE THINKS BUT UNSURE OF COST BECAUSE HE HAS BEEN OUT OF TOWN.  STAT[place] HE TYPICALLY DOES NOT PICKUP HIS OWN PRESCRIPTION DUE TO BEING OUT OF TOWN SO HE IS NOT SURE OF THE USUAL COST Other? No  Any new allergies since your discharge? No  Dietary orders reviewed? Yes Do you have support at home? Yes   Home Care and Equipment/Supplies: Were home health services ordered? no If so, what is the name of the agency? NA  Has the agency set up a time to come to the patient's home? not applicable Were any new equipment or medical supplies ordered?  No What is the name of the medical supply agency? NA Were you able to get the supplies/equipment? not applicable Do you have any questions related to the use of the equipment or supplies? No  Functional Questionnaire: (I = Independent and D = Dependent) ADLs: I  Bathing/Dressing- I  Meal Prep- I  Eating- I  Maintaining continence- I  Transferring/Ambulation- I  Managing Meds- A  Follow up appointments reviewed:  PCP Hospital f/u appt confirmed? Yes  Scheduled to see DR. Edwina Barth [facility] on 7/11 @ 1440. Specialist Hospital f/u appt confirmed? No   Are transportation arrangements needed? No  If their condition worsens, is the pt aware to call PCP or go to the Emergency Dept.? Yes Was the patient provided with contact information for the PCP's office or ED? Yes Was to pt encouraged to call back with questions or concerns? Yes  SDOH assessments and interventions completed:   No  Care Coordination Interventions Activated:   Yes Care Coordination Interventions:  Referred for Care Coordination Services:  RN Care Coordinator DISCUSSED WITH PATIENT RNCM REFERRAL FOR CONFIRMING PICKING UP TRULICITY WITH ELEVATED A1C (10.1) VERSUS NEEDING PHARMACY REFERRAL FOR MEDICATION ASSISTANCE   Encounter Outcome:  Pt. Visit Completed  Rhae Lerner RN, MSN RN Care Management Coordinator  [facility] [phone] [email or web address]

## 2022-06-10 ENCOUNTER — Telehealth: Payer: Self-pay

## 2022-06-10 NOTE — Patient Outreach (Signed)
  Care Management   Follow Up Note   06/10/2022 Name: Paul Roberson MRN: [id number] DOB: [birth year]   Referred by: Georgina Quint, MD Reason for referral : No chief complaint on file.   An unsuccessful telephone outreach was attempted today. The patient was referred to the case management team for assistance with care management and care coordination.   Follow Up Plan:  RNCM will send to the Care Guide to reschedule.  Kathyrn Sheriff, RN, MSN, BSN, CCM Care Management Coordinator [phone]

## 2022-06-17 ENCOUNTER — Telehealth: Payer: Self-pay | Admitting: *Deleted

## 2022-06-17 NOTE — Chronic Care Management (AMB) (Signed)
  Care Coordination  Outreach Note  06/17/2022 Name: Paul Roberson MRN: [id number] DOB: 1971-05-05   Care Coordination Outreach Attempts  outreach to reschedule initial   Follow Up Plan:  Additional outreach attempts will be made to offer the patient care coordination information and services.   Encounter Outcome:  No Answer   Burman Nieves, CCMA Care Coordination Care Guide Direct Dial: [phone]

## 2022-07-01 ENCOUNTER — Ambulatory Visit: Payer: BC Managed Care – PPO | Admitting: Emergency Medicine

## 2022-07-01 ENCOUNTER — Encounter: Payer: Self-pay | Admitting: Emergency Medicine

## 2022-07-01 ENCOUNTER — Ambulatory Visit (INDEPENDENT_AMBULATORY_CARE_PROVIDER_SITE_OTHER): Payer: BC Managed Care – PPO | Admitting: Emergency Medicine

## 2022-07-01 VITALS — BP 148/88 | HR 103 | Temp 98.3°F | Ht 70.0 in | Wt 219.5 lb

## 2022-07-01 DIAGNOSIS — E1159 Type 2 diabetes mellitus with other circulatory complications: Secondary | ICD-10-CM | POA: Diagnosis not present

## 2022-07-01 DIAGNOSIS — I152 Hypertension secondary to endocrine disorders: Secondary | ICD-10-CM

## 2022-07-01 DIAGNOSIS — D649 Anemia, unspecified: Secondary | ICD-10-CM | POA: Diagnosis not present

## 2022-07-01 DIAGNOSIS — F17200 Nicotine dependence, unspecified, uncomplicated: Secondary | ICD-10-CM

## 2022-07-01 LAB — POCT GLYCOSYLATED HEMOGLOBIN (HGB A1C): Hemoglobin A1C: 8.5 % — AB (ref 4.0–5.6)

## 2022-07-01 MED ORDER — EMPAGLIFLOZIN 10 MG PO TABS: 10.0000 mg | ORAL_TABLET | Freq: Every day | ORAL | 1 refills | Status: DC

## 2022-07-01 MED ORDER — LOSARTAN POTASSIUM-HCTZ 50-12.5 MG PO TABS: 1.0000 | ORAL_TABLET | Freq: Every day | ORAL | 1 refills | Status: DC

## 2022-07-01 NOTE — Patient Instructions (Signed)

## 2022-07-01 NOTE — Assessment & Plan Note (Signed)
Stable and asymptomatic. Lab Results  Component Value Date   WBC 5.7 05/27/2022   HGB 11.9 (L) 05/27/2022   HCT 36.0 (L) 05/27/2022   MCV 87.6 05/27/2022   PLT 229.0 05/27/2022

## 2022-07-01 NOTE — Progress Notes (Signed)
Paul Roberson 51 y.o.   Chief Complaint  Patient presents with   Follow-up    F/u DM, concern about diabetes     HISTORY OF PRESENT ILLNESS: This is a 51 y.o. male with history of diabetes and hypertension here for follow-up. 1 hypertension: On amlodipine 10 mg and lisinopril 40 mg.  Blood pressure readings at home similar to the ones in the office 2.  Diabetes: On metformin, glipizide, and weekly Trulicity.  Was not able to fill [place]. Overall doing well.  Has no complaints or medical concerns today.  HPI   Prior to Admission medications   Medication Sig Start Date End Date Taking? Authorizing Provider  amLODipine (NORVASC) 10 MG tablet Take 1 tablet (10 mg total) by mouth daily. Patient taking differently: Take 10 mg by mouth every morning. 03/12/22  Yes Caylei Sperry, Eilleen Kempf, MD  Aspirin-Salicylamide-Caffeine [facility] HEADACHE POWDER PO) Take 1 packet by mouth 2 (two) times daily as needed (pain/headache).   Yes [provider]  Continuous Blood Gluc Receiver (FREESTYLE LIBRE 2 READER) DEVI Dose,Route,Frequency: As Directed 03/14/22  Yes Aubryn Spinola, Eilleen Kempf, MD  Continuous Blood Gluc Sensor (FREESTYLE LIBRE 2 SENSOR) MISC Dose, Route Frequency: As Directed 03/14/22  Yes Kismet Facemire, Eilleen Kempf, MD  Dulaglutide (TRULICITY) 1.5 MG/0.5ML SOPN Inject 1.5 mg into the skin once a week. 05/27/22  Yes Stephine Langbehn, Eilleen Kempf, MD  glipiZIDE (GLUCOTROL) 5 MG tablet Take 1 tablet (5 mg total) by mouth 2 (two) times daily before a meal. Patient taking differently: Take 5 mg by mouth every morning. 03/12/22  Yes Justise Ehmann, Eilleen Kempf, MD  lisinopril (ZESTRIL) 40 MG tablet Take 1 tablet (40 mg total) by mouth daily. Patient taking differently: Take 40 mg by mouth every morning. 03/12/22  Yes Kipton Skillen, Eilleen Kempf, MD  metFORMIN (GLUCOPHAGE) 1000 MG tablet Take 1 tablet (1,000 mg total) by mouth 2 (two) times daily with a meal. 03/12/22  Yes Amry Cathy, Eilleen Kempf, MD  rosuvastatin (CRESTOR) 5 MG  tablet Take 1 tablet (5 mg total) by mouth at bedtime. 05/27/22 05/22/23 Yes Brinleigh Tew, Eilleen Kempf, MD  dapagliflozin propanediol (FARXIGA) 10 MG TABS tablet Take 1 tablet (10 mg total) by mouth daily before breakfast. Patient not taking: Reported on 07/01/2022 05/27/22 05/22/23  Georgina Quint, MD    Allergies  Allergen Reactions   Sulfa Antibiotics Other (See Comments)    Causes real bad headache   Penicillins Itching    Patient Active Problem List   Diagnosis Date Noted   Chronic anemia 05/27/2022   Hypertension associated with type 2 diabetes mellitus (HCC) 05/28/2017   Type 2 diabetes mellitus without complication, without long-term current use of insulin (HCC) 05/28/2017    Past Medical History:  Diagnosis Date   Diabetes mellitus without complication (HCC)    Hypertension     History reviewed. No pertinent surgical history.  Social History   Socioeconomic History   Marital status: Married    Spouse name: Not on file   Number of children: Not on file   Years of education: Not on file   Highest education level: Not on file  Occupational History   Not on file  Tobacco Use   Smoking status: Some Days   Smokeless tobacco: Never  Vaping Use   Vaping Use: Never used  Substance and Sexual Activity   Alcohol use: Yes   Drug use: No   Sexual activity: Yes  Other Topics Concern   Not on file  Social History Narrative   Not  on file   Social Determinants of Health   Financial Resource Strain: Not on file  Food Insecurity: Not on file  Transportation Needs: No Transportation Needs (06/05/2022)   PRAPARE - Administrator, Civil Service (Medical): No    Lack of Transportation (Non-Medical): No  Physical Activity: Not on file  Stress: Not on file  Social Connections: Not on file  Intimate Partner Violence: Not on file    Family History  Problem Relation Age of Onset   Diabetes Mother    Heart disease Mother    Hypertension Mother    Asthma Mother     Diabetes Father    Heart disease Father    Hypertension Father    Kidney disease Father    Cancer Father      Review of Systems  Constitutional: Negative.  Negative for chills and fever.  HENT: Negative.  Negative for congestion and sore throat.   Respiratory: Negative.  Negative for cough and shortness of breath.   Cardiovascular: Negative.  Negative for chest pain and palpitations.  Gastrointestinal:  Negative for abdominal pain, diarrhea, nausea and vomiting.  Genitourinary: Negative.   Skin: Negative.  Negative for rash.  Neurological: Negative.  Negative for dizziness and headaches.  All other systems reviewed and are negative.  Today's Vitals   07/01/22 1337 07/01/22 1340  BP: (!) 158/92 (!) 148/88  Pulse: (!) 103   Temp: 98.3 F (36.8 C)   TempSrc: Oral   SpO2: 95%   Weight: 219 lb 8 oz (99.6 kg)   Height: 5\' 10"  (1.778 m)    Body mass index is 31.49 kg/m. Wt Readings from Last 3 Encounters:  07/01/22 219 lb 8 oz (99.6 kg)  05/27/22 213 lb (96.6 kg)  05/18/22 225 lb (102.1 kg)     Physical Exam Vitals reviewed.  Constitutional:      Appearance: Normal appearance.  HENT:     Head: Normocephalic.     Mouth/Throat:     Mouth: Mucous membranes are moist.     Pharynx: Oropharynx is clear.  Eyes:     Extraocular Movements: Extraocular movements intact.     Pupils: Pupils are equal, round, and reactive to light.  Cardiovascular:     Rate and Rhythm: Normal rate and regular rhythm.     Pulses: Normal pulses.     Heart sounds: Normal heart sounds.  Pulmonary:     Effort: Pulmonary effort is normal.     Breath sounds: Normal breath sounds.  Abdominal:     Palpations: Abdomen is soft.     Tenderness: There is no abdominal tenderness.  Musculoskeletal:     Cervical back: No tenderness.  Lymphadenopathy:     Cervical: No cervical adenopathy.  Skin:    General: Skin is warm and dry.     Capillary Refill: Capillary refill takes less than 2 seconds.   Neurological:     General: No focal deficit present.     Mental Status: He is alert and oriented to person, place, and time.  Psychiatric:        Mood and Affect: Mood normal.        Behavior: Behavior normal.    Results for orders placed or performed in visit on 07/01/22 (from the past 24 hour(s))  POCT glycosylated hemoglobin (Hb A1C)     Status: Abnormal   Collection Time: 07/01/22  1:58 PM  Result Value Ref Range   Hemoglobin A1C 8.5 (A) 4.0 - 5.6 %  HbA1c POC (<> result, manual entry)     HbA1c, POC (prediabetic range)     HbA1c, POC (controlled diabetic range)       ASSESSMENT & PLAN: A total of 44 minutes was spent with the patient and counseling/coordination of care regarding preparing for this visit, review of most recent office visit notes, review of most recent blood work results including today's interpretation of hemoglobin A1c, education on nutrition, cardiovascular risks associated with uncontrolled hypertension, review of multiple chronic medical problems and their management, review of all medications and changes made, prognosis, documentation, and need for follow-up.  Problem List Items Addressed This Visit       Cardiovascular and Mediastinum   Hypertension associated with type 2 diabetes mellitus (HCC) - Primary    Elevated blood pressure readings in the office and at home. Continue amlodipine 10 mg.  Stop lisinopril. Start losartan HCTZ 50-12.5 mg daily. Better controlled diabetes with hemoglobin A1c better than before at 8.5. Continue metformin 1000 mg twice a day and glipizide 5 mg twice a day.  Continue weekly Trulicity 1.5 mg.  We will attempt to start Jardiance or Farxiga depending on insurance's formulary. Cardiovascular risks associated with hypertension and diabetes discussed. Diet and nutrition discussed. Follow-up in 3 months.      Relevant Medications   losartan-hydrochlorothiazide (HYZAAR) 50-12.5 MG tablet   empagliflozin (JARDIANCE) 10 MG  TABS tablet   Other Relevant Orders   POCT glycosylated hemoglobin (Hb A1C) (Completed)     Other   Chronic anemia    Stable and asymptomatic. Lab Results  Component Value Date   WBC 5.7 05/27/2022   HGB 11.9 (L) 05/27/2022   HCT 36.0 (L) 05/27/2022   MCV 87.6 05/27/2022   PLT 229.0 05/27/2022         Current smoker    Smoking about 4 to 5 cigarettes/day. Cardiovascular and cancer risks associated with smoking discussed. Smoking cessation advice given.        Patient Instructions  Diabetes Mellitus and Nutrition, Adult When you have diabetes, or diabetes mellitus, it is very important to have healthy eating habits because your blood sugar (glucose) levels are greatly affected by what you eat and drink. Eating healthy foods in the right amounts, at about the same times every day, can help you: Manage your blood glucose. Lower your risk of heart disease. Improve your blood pressure. Reach or maintain a healthy weight. What can affect my meal plan? Every person with diabetes is different, and each person has different needs for a meal plan. Your health care provider may recommend that you work with a dietitian to make a meal plan that is best for you. Your meal plan may vary depending on factors such as: The calories you need. The medicines you take. Your weight. Your blood glucose, blood pressure, and cholesterol levels. Your activity level. Other health conditions you have, such as heart or kidney disease. How do carbohydrates affect me? Carbohydrates, also called carbs, affect your blood glucose level more than any other type of food. Eating carbs raises the amount of glucose in your blood. It is important to know how many carbs you can safely have in each meal. This is different for every person. Your dietitian can help you calculate how many carbs you should have at each meal and for each snack. How does alcohol affect me? Alcohol can cause a decrease in blood glucose  (hypoglycemia), especially if you use insulin or take certain diabetes medicines by mouth. Hypoglycemia can  be a life-threatening condition. Symptoms of hypoglycemia, such as sleepiness, dizziness, and confusion, are similar to symptoms of having too much alcohol. Do not drink alcohol if: Your health care provider tells you not to drink. You are pregnant, may be pregnant, or are planning to become pregnant. If you drink alcohol: Limit how much you have to: 0-1 drink a day for women. 0-2 drinks a day for men. Know how much alcohol is in your drink. In the [place] one drink equals one 12 oz bottle of beer (355 mL), one 5 oz glass of wine (148 mL), or one 1 oz glass of hard liquor (44 mL). Keep yourself hydrated with water, diet soda, or unsweetened iced tea. Keep in mind that regular soda, juice, and other mixers may contain a lot of sugar and must be counted as carbs. What are tips for following this plan?  Reading food labels Start by checking the serving size on the Nutrition Facts label of packaged foods and drinks. The number of calories and the amount of carbs, fats, and other nutrients listed on the label are based on one serving of the item. Many items contain more than one serving per package. Check the total grams (g) of carbs in one serving. Check the number of grams of saturated fats and trans fats in one serving. Choose foods that have a low amount or none of these fats. Check the number of milligrams (mg) of salt (sodium) in one serving. Most people should limit total sodium intake to less than 2,300 mg per day. Always check the nutrition information of foods labeled as "low-fat" or "nonfat." These foods may be higher in added sugar or refined carbs and should be avoided. Talk to your dietitian to identify your daily goals for nutrients listed on the label. Shopping Avoid buying canned, pre-made, or processed foods. These foods tend to be high in fat, sodium, and added sugar. Shop  around the outside edge of the grocery store. This is where you will most often find fresh fruits and vegetables, bulk grains, fresh meats, and fresh dairy products. Cooking Use low-heat cooking methods, such as baking, instead of high-heat cooking methods, such as deep frying. Cook using healthy oils, such as olive, canola, or sunflower oil. Avoid cooking with butter, cream, or high-fat meats. Meal planning Eat meals and snacks regularly, preferably at the same times every day. Avoid going long periods of time without eating. Eat foods that are high in fiber, such as fresh fruits, vegetables, beans, and whole grains. Eat 4-6 oz (112-168 g) of lean protein each day, such as lean meat, chicken, fish, eggs, or tofu. One ounce (oz) (28 g) of lean protein is equal to: 1 oz (28 g) of meat, chicken, or fish. 1 egg.  cup (62 g) of tofu. Eat some foods each day that contain healthy fats, such as avocado, nuts, seeds, and fish. What foods should I eat? Fruits Berries. Apples. Oranges. Peaches. Apricots. Plums. Grapes. Mangoes. Papayas. Pomegranates. Kiwi. Cherries. Vegetables Leafy greens, including lettuce, spinach, kale, chard, collard greens, mustard greens, and cabbage. Beets. Cauliflower. Broccoli. Carrots. Goetsch beans. Tomatoes. Peppers. Onions. Cucumbers. Brussels sprouts. Grains Whole grains, such as whole-wheat or whole-grain bread, crackers, tortillas, cereal, and pasta. Unsweetened oatmeal. Quinoa. Brown or wild rice. Meats and other proteins Seafood. Poultry without skin. Lean cuts of poultry and beef. Tofu. Nuts. Seeds. Dairy Low-fat or fat-free dairy products such as milk, yogurt, and cheese. The items listed above may not be a complete list of  foods and beverages you can eat and drink. Contact a dietitian for more information. What foods should I avoid? Fruits Fruits canned with syrup. Vegetables Canned vegetables. Frozen vegetables with butter or cream sauce. Grains Refined  white flour and flour products such as bread, pasta, snack foods, and cereals. Avoid all processed foods. Meats and other proteins Fatty cuts of meat. Poultry with skin. Breaded or fried meats. Processed meat. Avoid saturated fats. Dairy Full-fat yogurt, cheese, or milk. Beverages Sweetened drinks, such as soda or iced tea. The items listed above may not be a complete list of foods and beverages you should avoid. Contact a dietitian for more information. Questions to ask a health care provider Do I need to meet with a certified diabetes care and education specialist? Do I need to meet with a dietitian? What number can I call if I have questions? When are the best times to check my blood glucose? Where to find more information: American Diabetes Association: diabetes.org Academy of Nutrition and Dietetics: eatright.[employer] Corporationorg National Institute of Diabetes and Digestive and Kidney Diseases: StageSync.siniddk.nih.gov Association of Diabetes Care & Education Specialists: diabeteseducator.org Summary It is important to have healthy eating habits because your blood sugar (glucose) levels are greatly affected by what you eat and drink. It is important to use alcohol carefully. A healthy meal plan will help you manage your blood glucose and lower your risk of heart disease. Your health care provider may recommend that you work with a dietitian to make a meal plan that is best for you. This information is not intended to replace advice given to you by your health care provider. Make sure you discuss any questions you have with your health care provider. Document Revised: 06/06/2020 Document Reviewed: 06/06/2020 Elsevier Patient Education  2023 Elsevier Inc.      Edwina BarthMiguel Jesus Nevills, MD  [facility]

## 2022-07-01 NOTE — Assessment & Plan Note (Addendum)
Elevated blood pressure readings in the office and at home. Continue amlodipine 10 mg.  Stop lisinopril. Start losartan HCTZ 50-12.5 mg daily. Better controlled diabetes with hemoglobin A1c better than before at 8.5. Continue metformin 1000 mg twice a day and glipizide 5 mg twice a day.  Continue weekly Trulicity 1.5 mg.  We will attempt to start Jardiance or Farxiga depending on insurance's formulary. Cardiovascular risks associated with hypertension and diabetes discussed. Diet and nutrition discussed. Follow-up in 3 months.

## 2022-07-01 NOTE — Assessment & Plan Note (Signed)
Smoking about 4 to 5 cigarettes/day. Cardiovascular and cancer risks associated with smoking discussed. Smoking cessation advice given.

## 2022-07-30 NOTE — Chronic Care Management (AMB) (Signed)
  Care Coordination  Outreach Note  07/30/2022 Name: Paul Roberson MRN: [id number] DOB: 04-May-1971   Care Coordination Outreach Attempts: A second unsuccessful outreach was attempted today to offer the patient with information about available care coordination services as a benefit of their health plan.     Follow Up Plan:  Additional outreach attempts will be made to offer the patient care coordination information and services.   Encounter Outcome:  No Answer  Burman Nieves, CCMA Care Coordination Care Guide Direct Dial: [phone]

## 2022-08-01 NOTE — Chronic Care Management (AMB) (Signed)
  Care Coordination  Outreach Note  08/01/2022 Name: Paul Roberson MRN: [id number] DOB: 02/26/1971   Care Coordination Outreach Attempts: A third unsuccessful outreach was attempted today to offer the patient with information about available care coordination services as a benefit of their health plan.   Follow Up Plan:  No further outreach attempts will be made at this time. We have been unable to contact the patient to offer or enroll patient in care coordination services  Encounter Outcome:  No Answer  Burman Nieves, [facility] Care Coordination Care Guide Direct Dial: [phone]

## 2022-08-26 ENCOUNTER — Telehealth: Payer: Self-pay | Admitting: Emergency Medicine

## 2022-08-26 NOTE — Telephone Encounter (Signed)
PT visits today with a form to be filled out by Dr.Sagardia. I had let PT know that it can take 7-10 days for paperwork but PT is needing this to return back to work. The form is currently in [place]. PT would like this formed faxed out and a finished copy kept for them to pick up.  CB: [phone] BUY:[phone]

## 2022-08-27 NOTE — Telephone Encounter (Signed)
Form has been received and put in provider box for completion and signature. Will call patient when ready for pick up and to be faxed

## 2022-08-27 NOTE — Telephone Encounter (Signed)
Called patient and left message to inform patient that his form was ready for pick up. Form at front desk. Will fax form as well

## 2023-04-07 ENCOUNTER — Ambulatory Visit (INDEPENDENT_AMBULATORY_CARE_PROVIDER_SITE_OTHER): Payer: BC Managed Care – PPO | Admitting: Emergency Medicine

## 2023-04-07 ENCOUNTER — Encounter: Payer: Self-pay | Admitting: Emergency Medicine

## 2023-04-07 VITALS — BP 160/110 | HR 95 | Temp 98.4°F | Ht 70.0 in | Wt 222.5 lb

## 2023-04-07 DIAGNOSIS — I10 Essential (primary) hypertension: Secondary | ICD-10-CM | POA: Diagnosis not present

## 2023-04-07 DIAGNOSIS — I152 Hypertension secondary to endocrine disorders: Secondary | ICD-10-CM | POA: Diagnosis not present

## 2023-04-07 DIAGNOSIS — F331 Major depressive disorder, recurrent, moderate: Secondary | ICD-10-CM | POA: Insufficient documentation

## 2023-04-07 DIAGNOSIS — F17200 Nicotine dependence, unspecified, uncomplicated: Secondary | ICD-10-CM | POA: Diagnosis not present

## 2023-04-07 DIAGNOSIS — E1165 Type 2 diabetes mellitus with hyperglycemia: Secondary | ICD-10-CM

## 2023-04-07 DIAGNOSIS — Z7984 Long term (current) use of oral hypoglycemic drugs: Secondary | ICD-10-CM

## 2023-04-07 DIAGNOSIS — E1159 Type 2 diabetes mellitus with other circulatory complications: Secondary | ICD-10-CM | POA: Diagnosis not present

## 2023-04-07 DIAGNOSIS — Z7985 Long-term (current) use of injectable non-insulin antidiabetic drugs: Secondary | ICD-10-CM

## 2023-04-07 LAB — CBC WITH DIFFERENTIAL/PLATELET
Basophils Absolute: 0 10*3/uL (ref 0.0–0.1)
Basophils Relative: 0.6 % (ref 0.0–3.0)
Eosinophils Absolute: 0.1 10*3/uL (ref 0.0–0.7)
Eosinophils Relative: 2.3 % (ref 0.0–5.0)
HCT: 41.2 % (ref 39.0–52.0)
Hemoglobin: 13.4 g/dL (ref 13.0–17.0)
Lymphocytes Relative: 32 % (ref 12.0–46.0)
Lymphs Abs: 1.8 10*3/uL (ref 0.7–4.0)
MCHC: 32.4 g/dL (ref 30.0–36.0)
MCV: 89.2 fl (ref 78.0–100.0)
Monocytes Absolute: 0.5 10*3/uL (ref 0.1–1.0)
Monocytes Relative: 9.6 % (ref 3.0–12.0)
Neutro Abs: 3.1 10*3/uL (ref 1.4–7.7)
Neutrophils Relative %: 55.5 % (ref 43.0–77.0)
Platelets: 254 10*3/uL (ref 150.0–400.0)
RBC: 4.62 Mil/uL (ref 4.22–5.81)
RDW: 15.1 % (ref 11.5–15.5)
WBC: 5.5 10*3/uL (ref 4.0–10.5)

## 2023-04-07 LAB — COMPREHENSIVE METABOLIC PANEL
ALT: 24 U/L (ref 0–53)
AST: 23 U/L (ref 0–37)
Albumin: 4.3 g/dL (ref 3.5–5.2)
Alkaline Phosphatase: 70 U/L (ref 39–117)
BUN: 21 mg/dL (ref 6–23)
CO2: 27 mEq/L (ref 19–32)
Calcium: 9.6 mg/dL (ref 8.4–10.5)
Chloride: 103 mEq/L (ref 96–112)
Creatinine, Ser: 1.33 mg/dL (ref 0.40–1.50)
GFR: 62.64 mL/min (ref >60.00)
Glucose, Bld: 256 mg/dL — ABNORMAL HIGH (ref 70–99)
Potassium: 4.6 mEq/L (ref 3.5–5.1)
Sodium: 138 mEq/L (ref 135–145)
Total Bilirubin: 0.4 mg/dL (ref 0.2–1.2)
Total Protein: 7.7 g/dL (ref 6.0–8.3)

## 2023-04-07 LAB — LIPID PANEL
Cholesterol: 163 mg/dL (ref 0–200)
HDL: 41.5 mg/dL (ref >39.00)
LDL Cholesterol: 86 mg/dL (ref 0–99)
NonHDL: 121.84
Total CHOL/HDL Ratio: 4
Triglycerides: 177 mg/dL — ABNORMAL HIGH (ref 0.0–149.0)
VLDL: 35.4 mg/dL (ref 0.0–40.0)

## 2023-04-07 LAB — POCT GLYCOSYLATED HEMOGLOBIN (HGB A1C): Hemoglobin A1C: 8.7 % — AB (ref 4.0–5.6)

## 2023-04-07 MED ORDER — METFORMIN HCL 1000 MG PO TABS
1000.0000 mg | ORAL_TABLET | Freq: Two times a day (BID) | ORAL | 3 refills | Status: DC
Start: 2023-04-07 — End: 2024-01-14

## 2023-04-07 MED ORDER — EMPAGLIFLOZIN 10 MG PO TABS: 10.0000 mg | ORAL_TABLET | Freq: Every day | ORAL | 1 refills | Status: DC

## 2023-04-07 MED ORDER — LOSARTAN POTASSIUM-HCTZ 50-12.5 MG PO TABS: 1.0000 | ORAL_TABLET | Freq: Every day | ORAL | 3 refills | Status: DC

## 2023-04-07 MED ORDER — AMLODIPINE BESYLATE 10 MG PO TABS: 10.0000 mg | ORAL_TABLET | Freq: Every day | ORAL | 3 refills | Status: DC

## 2023-04-07 MED ORDER — CITALOPRAM HYDROBROMIDE 10 MG PO TABS: 10.0000 mg | ORAL_TABLET | Freq: Every day | ORAL | 3 refills | Status: AC

## 2023-04-07 MED ORDER — GLIPIZIDE 5 MG PO TABS
5.0000 mg | ORAL_TABLET | Freq: Every morning | ORAL | 3 refills | Status: DC
Start: 2023-04-07 — End: 2024-01-14

## 2023-04-07 MED ORDER — CITALOPRAM HYDROBROMIDE 10 MG PO TABS: 10.0000 mg | ORAL_TABLET | Freq: Every day | ORAL | 3 refills | Status: DC

## 2023-04-07 NOTE — Assessment & Plan Note (Signed)
History of chronic depression.  Currently active. Not on medication at present time Recommend to start on Celexa 10 mg daily Needs psychiatric evaluation Referral placed today

## 2023-04-07 NOTE — Assessment & Plan Note (Addendum)
Uncontrolled hypertension.  Not presently taking medication. Recommend to restart amlodipine 10 mg daily and Hyzaar 50-12.5 mg daily Uncontrolled diabetes with hemoglobin A1c at 8.7 Not compliant with medications Recommend to continue metformin 1000 mg twice a day, glipizide 5 mg in the morning, Jardiance 10 mg daily Cardiovascular risks associated with uncontrolled hypertension and uncontrolled diabetes discussed Diet and nutrition discussed Benefits of exercise discussed Follow-up in 3 months.

## 2023-04-07 NOTE — Patient Instructions (Signed)
Major Depressive Disorder, Adult Major depressive disorder (MDD) is a mental health condition. People with this disorder feel very sad, hopeless, and lose interest in things. Symptoms last most of the day, almost every day, for 2 weeks. MDD can affect: Relationships. Work and school. Things you usually like to do. What are the causes? The cause of MDD is not known. What increases the risk? Having family members with depression. Being male. Family problems. Alcohol or drug misuse. A lot of stress in your life, such as from: Living without basic needs such as food and housing. Being treated poorly because of race, sex, or religion (discrimination). Things that caused you pain as a child, especially if you lost a parent or were abused. Health and mental problems that you have had for a long time. What are the signs or symptoms? The main symptoms of this condition are: Being sad all the time. Being grouchy (irritable) all the time. Not enjoying the things you usually like. Sleeping too much or too little. Eating too much or too little. Feeling tired. Other symptoms include: Gaining or losing weight, without knowing why. Being restless and weak. Feeling hopeless, worthless, or guilty. Trouble thinking or making decisions. Thoughts of hurting yourself or others, or thoughts of ending your life. Spending a lot of time alone. Being unable to do daily tasks. If you have very bad MDD, you may: Believe things that are not true. Hear, see, taste, or feel things that are not there. Have mild depression that lasts for at least 2 years. Feel very sad and hopeless. Have trouble speaking or moving. Feel very sad during some seasons. How is this treated? Talk therapy. This teaches you about thoughts, feelings, and actions and how to change them. This can also help you to talk with others. This can be done with members of your family. Medicines. Lifestyle changes. You may need to: Limit  alcohol use. Stop using drugs, if you use them. Exercise. Get plenty of sleep. Eat healthy. Spend more time outdoors. Brain stimulation. This may be done when symptoms are very bad or have not gotten better. Follow these instructions at home: Alcohol use Do not drink alcohol if: Your health care provider tells you not to drink. You are pregnant, may be pregnant, or are planning to become pregnant. If you drink alcohol: Limit how much you use to: 0-1 drink a day for women. 0-2 drinks a day for men. Know how much alcohol is in your drink. In the [place] one drink equals one 12 oz bottle of beer (355 mL), one 5 oz glass of wine (148 mL), or one 1 oz glass of hard liquor (44 mL). Activity Exercise as told by your doctor. Spend time outdoors. Make time to do the things you enjoy. Find ways to deal with stress. Try to: Meditate. Do deep breathing. Spend time in nature. Keep a journal. Return to your normal activities when your doctor says that it is safe. General instructions  Take over-the-counter and prescription medicines only as told by your doctor. Talk to your doctor about: Alcohol use. It can affect medicines. Any drug use. Eat healthy foods. Get a lot of sleep. Think about joining a support group. Ask your doctor about that. Keep all follow-up visits. Your doctor will need to check on your mood, behavior, and medicines, and change your treatment as needed. Where to find more information: National Alliance on Mental Illness: nami.org National Institute of Mental Health: nimh.nih.gov American Psychiatric Association: psychiatry.org Contact a doctor   if: You feel worse. You get new symptoms. Get help right away if: You hurt yourself on purpose (self-harm). You have thoughts about hurting yourself or others. You see, hear, taste, smell, or feel things that are not there. Get help right away if you feel like you may hurt yourself or others, or have thoughts about taking  your own life. Go to your nearest emergency room or: Call 911. Call the National Suicide Prevention Lifeline at 1-800-273-8255 or 988. This is open 24 hours a day. Text the Crisis Text Line at 741741. This information is not intended to replace advice given to you by your health care provider. Make sure you discuss any questions you have with your health care provider. Document Revised: 03/11/2022 Document Reviewed: 03/11/2022 Elsevier Patient Education  2023 Elsevier Inc.  

## 2023-04-07 NOTE — Progress Notes (Addendum)
Paul Roberson 52 y.o.   Chief Complaint  Patient presents with   Medical Management of Chronic Issues    F/u appt, pt has some issues he wants to discuss with the provider     HISTORY OF PRESENT ILLNESS: This is a 52 y.o. male here for follow-up of chronic medical conditions including hypertension and diabetes Accompanied by mother today Also complaining of chronic depression, presently active. No other complaints or medical concerns today.  HPI   Prior to Admission medications   Medication Sig Start Date End Date Taking? Authorizing Provider  Aspirin-Salicylamide-Caffeine (BC HEADACHE POWDER PO) Take 1 packet by mouth 2 (two) times daily as needed (pain/headache).   Yes [provider]  citalopram (CELEXA) 10 MG tablet Take 1 tablet (10 mg total) by mouth daily. 04/07/23  Yes Romani Wilbon, Eilleen Kempf, MD  Continuous Blood Gluc Sensor (FREESTYLE LIBRE 2 SENSOR) MISC Dose, Route Frequency: As Directed 03/14/22  Yes Loyola Santino, Eilleen Kempf, MD  Dulaglutide (TRULICITY) 1.5 MG/0.5ML SOPN Inject 1.5 mg into the skin once a week. 05/27/22  Yes Juliani Laduke, Eilleen Kempf, MD  amLODipine (NORVASC) 10 MG tablet Take 1 tablet (10 mg total) by mouth daily. 04/07/23   Georgina Quint, MD  Continuous Blood Gluc Receiver (FREESTYLE LIBRE 2 READER) DEVI Dose,Route,Frequency: As Directed Patient not taking: Reported on 04/07/2023 03/14/22   Georgina Quint, MD  empagliflozin (JARDIANCE) 10 MG TABS tablet Take 1 tablet (10 mg total) by mouth daily. 04/07/23   Georgina Quint, MD  glipiZIDE (GLUCOTROL) 5 MG tablet Take 1 tablet (5 mg total) by mouth every morning. 04/07/23   Georgina Quint, MD  losartan-hydrochlorothiazide [facility]) 50-12.5 MG tablet Take 1 tablet by mouth daily. 04/07/23   Georgina Quint, MD  metFORMIN (GLUCOPHAGE) 1000 MG tablet Take 1 tablet (1,000 mg total) by mouth 2 (two) times daily with a meal. 04/07/23   Brandonn Capelli, Eilleen Kempf, MD  rosuvastatin (CRESTOR) 5  MG tablet Take 1 tablet (5 mg total) by mouth at bedtime. Patient not taking: Reported on 04/07/2023 05/27/22 05/22/23  Georgina Quint, MD    Allergies  Allergen Reactions   Sulfa Antibiotics Other (See Comments)    Causes real bad headache   Penicillins Itching    Patient Active Problem List   Diagnosis Date Noted   Moderate episode of recurrent major depressive disorder (HCC) 04/07/2023   Current smoker 07/01/2022   Chronic anemia 05/27/2022   Hypertension associated with type 2 diabetes mellitus (HCC) 05/28/2017   Type 2 diabetes mellitus without complication, without long-term current use of insulin (HCC) 05/28/2017    Past Medical History:  Diagnosis Date   Diabetes mellitus without complication (HCC)    Hypertension     No past surgical history on file.  Social History   Socioeconomic History   Marital status: Married    Spouse name: Not on file   Number of children: Not on file   Years of education: Not on file   Highest education level: Not on file  Occupational History   Not on file  Tobacco Use   Smoking status: Some Days   Smokeless tobacco: Never  Vaping Use   Vaping Use: Never used  Substance and Sexual Activity   Alcohol use: Yes   Drug use: No   Sexual activity: Yes  Other Topics Concern   Not on file  Social History Narrative   Not on file   Social Determinants of Health   Financial Resource Strain: Not on  file  Food Insecurity: Not on file  Transportation Needs: No Transportation Needs (06/05/2022)   PRAPARE - Administrator, Civil Service (Medical): No    Lack of Transportation (Non-Medical): No  Physical Activity: Not on file  Stress: Not on file  Social Connections: Not on file  Intimate Partner Violence: Not on file    Family History  Problem Relation Age of Onset   Diabetes Mother    Heart disease Mother    Hypertension Mother    Asthma Mother    Diabetes Father    Heart disease Father    Hypertension Father     Kidney disease Father    Cancer Father      Review of Systems  Constitutional: Negative.  Negative for chills and fever.  HENT: Negative.  Negative for congestion and sore throat.   Respiratory: Negative.  Negative for cough and shortness of breath.   Cardiovascular: Negative.  Negative for chest pain and palpitations.  Gastrointestinal:  Negative for abdominal pain, diarrhea, nausea and vomiting.  Genitourinary: Negative.  Negative for dysuria and hematuria.  Skin: Negative.  Negative for rash.  Neurological: Negative.  Negative for dizziness and headaches.  All other systems reviewed and are negative.   Vitals:   04/07/23 1418  BP: (!) 160/110  Pulse: 95  Temp: 98.4 F (36.9 C)  SpO2: 99%    Physical Exam Vitals reviewed.  Constitutional:      Appearance: Normal appearance.  HENT:     Head: Normocephalic.     Mouth/Throat:     Mouth: Mucous membranes are moist.     Pharynx: Oropharynx is clear.  Eyes:     Extraocular Movements: Extraocular movements intact.     Pupils: Pupils are equal, round, and reactive to light.  Cardiovascular:     Rate and Rhythm: Normal rate and regular rhythm.     Pulses: Normal pulses.     Heart sounds: Normal heart sounds.  Pulmonary:     Effort: Pulmonary effort is normal.     Breath sounds: Normal breath sounds.  Musculoskeletal:     Cervical back: No tenderness.  Lymphadenopathy:     Cervical: No cervical adenopathy.  Skin:    General: Skin is warm and dry.     Capillary Refill: Capillary refill takes less than 2 seconds.  Neurological:     General: No focal deficit present.     Mental Status: He is alert and oriented to person, place, and time.  Psychiatric:        Mood and Affect: Mood normal.        Behavior: Behavior normal.    Results for orders placed or performed in visit on 04/07/23 (from the past 24 hour(s))  POCT HgB A1C     Status: Abnormal   Collection Time: 04/07/23  4:24 PM  Result Value Ref Range    Hemoglobin A1C 8.7 (A) 4.0 - 5.6 %   HbA1c POC (<> result, manual entry)     HbA1c, POC (prediabetic range)     HbA1c, POC (controlled diabetic range)       ASSESSMENT & PLAN: A total of 48 minutes was spent with the patient and counseling/coordination of care regarding preparing for this visit, review of most recent office visit notes, review of multiple chronic medical conditions and their management, review of all medications and changes made, review of most recent blood work results including interpretation of today's hemoglobin A1c, cardiovascular risks associated with uncontrolled hypertension and diabetes,  diagnosis of active chronic depression and need for psychiatry evaluation and medications, education on nutrition, prognosis, documentation and need for follow-up  Problem List Items Addressed This Visit       Cardiovascular and Mediastinum   Hypertension associated with type 2 diabetes mellitus (HCC) - Primary    Uncontrolled hypertension.  Not presently taking medication. Recommend to restart amlodipine 10 mg daily and Hyzaar 50-12.5 mg daily Uncontrolled diabetes with hemoglobin A1c at 8.7 Not compliant with medications Recommend to continue metformin 1000 mg twice a day, glipizide 5 mg in the morning, Jardiance 10 mg daily Cardiovascular risks associated with uncontrolled hypertension and uncontrolled diabetes discussed Diet and nutrition discussed Benefits of exercise discussed Follow-up in 3 months.      Relevant Medications   amLODipine (NORVASC) 10 MG tablet   losartan-hydrochlorothiazide (HYZAAR) 50-12.5 MG tablet   metFORMIN (GLUCOPHAGE) 1000 MG tablet   empagliflozin (JARDIANCE) 10 MG TABS tablet   glipiZIDE (GLUCOTROL) 5 MG tablet   Other Relevant Orders   POCT HgB A1C (Completed)   CBC with Differential/Platelet (Completed)   Comprehensive metabolic panel (Completed)   Lipid panel (Completed)     Other   Current smoker    Cardiovascular and cancer risk  associated with smoking discussed Smoking cessation advice given      Moderate episode of recurrent major depressive disorder (HCC)    History of chronic depression.  Currently active. Not on medication at present time Recommend to start on Celexa 10 mg daily Needs psychiatric evaluation Referral placed today      Relevant Medications   citalopram (CELEXA) 10 MG tablet   Other Relevant Orders   Ambulatory referral to Psychiatry   Other Visit Diagnoses     Essential hypertension       Relevant Medications   amLODipine (NORVASC) 10 MG tablet   losartan-hydrochlorothiazide (HYZAAR) 50-12.5 MG tablet   Uncontrolled type 2 diabetes mellitus with hyperglycemia (HCC)       Relevant Medications   amLODipine (NORVASC) 10 MG tablet   losartan-hydrochlorothiazide (HYZAAR) 50-12.5 MG tablet   metFORMIN (GLUCOPHAGE) 1000 MG tablet   empagliflozin (JARDIANCE) 10 MG TABS tablet   glipiZIDE (GLUCOTROL) 5 MG tablet   Other Relevant Orders   POCT HgB A1C (Completed)        Patient Instructions  Major Depressive Disorder, Adult Major depressive disorder (MDD) is a mental health condition. People with this disorder feel very sad, hopeless, and lose interest in things. Symptoms last most of the day, almost every day, for 2 weeks. MDD can affect: Relationships. Work and school. Things you usually like to do. What are the causes? The cause of MDD is not known. What increases the risk? Having family members with depression. Being male. Family problems. Alcohol or drug misuse. A lot of stress in your life, such as from: Living without basic needs such as food and housing. Being treated poorly because of race, sex, or religion (discrimination). Things that caused you pain as a child, especially if you lost a parent or were abused. Health and mental problems that you have had for a long time. What are the signs or symptoms? The main symptoms of this condition are: Being sad all the  time. Being grouchy (irritable) all the time. Not enjoying the things you usually like. Sleeping too much or too little. Eating too much or too little. Feeling tired. Other symptoms include: Gaining or losing weight, without knowing why. Being restless and weak. Feeling hopeless, worthless, or guilty. Trouble thinking or  making decisions. Thoughts of hurting yourself or others, or thoughts of ending your life. Spending a lot of time alone. Being unable to do daily tasks. If you have very bad MDD, you may: Believe things that are not true. Hear, see, taste, or feel things that are not there. Have mild depression that lasts for at least 2 years. Feel very sad and hopeless. Have trouble speaking or moving. Feel very sad during some seasons. How is this treated? Talk therapy. This teaches you about thoughts, feelings, and actions and how to change them. This can also help you to talk with others. This can be done with members of your family. Medicines. Lifestyle changes. You may need to: Limit alcohol use. Stop using drugs, if you use them. Exercise. Get plenty of sleep. Eat healthy. Spend more time outdoors. Brain stimulation. This may be done when symptoms are very bad or have not gotten better. Follow these instructions at home: Alcohol use Do not drink alcohol if: Your health care provider tells you not to drink. You are pregnant, may be pregnant, or are planning to become pregnant. If you drink alcohol: Limit how much you use to: 0-1 drink a day for women. 0-2 drinks a day for men. Know how much alcohol is in your drink. In the [place] one drink equals one 12 oz bottle of beer (355 mL), one 5 oz glass of wine (148 mL), or one 1 oz glass of hard liquor (44 mL). Activity Exercise as told by your doctor. Spend time outdoors. Make time to do the things you enjoy. Find ways to deal with stress. Try to: Meditate. Do deep breathing. Spend time in nature. Keep a  journal. Return to your normal activities when your doctor says that it is safe. General instructions  Take over-the-counter and prescription medicines only as told by your doctor. Talk to your doctor about: Alcohol use. It can affect medicines. Any drug use. Eat healthy foods. Get a lot of sleep. Think about joining a support group. Ask your doctor about that. Keep all follow-up visits. Your doctor will need to check on your mood, behavior, and medicines, and change your treatment as needed. Where to find more information: [employer] on Mental Illness: nami[employer] of Mental Health: [employer].com American Psychiatric Association: psychiatry.org Contact a doctor if: You feel worse. You get new symptoms. Get help right away if: You hurt yourself on purpose (self-harm). You have thoughts about hurting yourself or others. You see, hear, taste, smell, or feel things that are not there. Get help right away if you feel like you may hurt yourself or others, or have thoughts about taking your own life. Go to your nearest emergency room or: Call 911. Call the National Suicide Prevention Lifeline at (450)133-7742 or 988. This is open 24 hours a day. Text the Crisis Text Line at 6821775461. This information is not intended to replace advice given to you by your health care provider. Make sure you discuss any questions you have with your health care provider. Document Revised: 03/11/2022 Document Reviewed: 03/11/2022 Elsevier Patient Education  2023 Elsevier Inc.      Edwina Barth, MD [facility]

## 2023-04-07 NOTE — Assessment & Plan Note (Signed)
Cardiovascular and cancer risk associated with smoking discussed. Smoking cessation advice given. 

## 2023-05-05 ENCOUNTER — Emergency Department (HOSPITAL_BASED_OUTPATIENT_CLINIC_OR_DEPARTMENT_OTHER): Payer: BC Managed Care – PPO

## 2023-05-05 ENCOUNTER — Other Ambulatory Visit: Payer: Self-pay

## 2023-05-05 ENCOUNTER — Encounter (HOSPITAL_BASED_OUTPATIENT_CLINIC_OR_DEPARTMENT_OTHER): Payer: Self-pay

## 2023-05-05 ENCOUNTER — Emergency Department (HOSPITAL_BASED_OUTPATIENT_CLINIC_OR_DEPARTMENT_OTHER)
Admission: EM | Admit: 2023-05-05 | Discharge: 2023-05-05 | Disposition: A | Discharge status: Home / Self Care | Payer: BC Managed Care – PPO | Attending: Emergency Medicine | Admitting: Emergency Medicine

## 2023-05-05 DIAGNOSIS — R7989 Other specified abnormal findings of blood chemistry: Secondary | ICD-10-CM | POA: Insufficient documentation

## 2023-05-05 DIAGNOSIS — R109 Unspecified abdominal pain: Secondary | ICD-10-CM | POA: Diagnosis not present

## 2023-05-05 DIAGNOSIS — E1165 Type 2 diabetes mellitus with hyperglycemia: Secondary | ICD-10-CM | POA: Insufficient documentation

## 2023-05-05 DIAGNOSIS — R799 Abnormal finding of blood chemistry, unspecified: Secondary | ICD-10-CM | POA: Insufficient documentation

## 2023-05-05 DIAGNOSIS — E871 Hypo-osmolality and hyponatremia: Secondary | ICD-10-CM | POA: Diagnosis not present

## 2023-05-05 DIAGNOSIS — I700 Atherosclerosis of aorta: Secondary | ICD-10-CM | POA: Diagnosis not present

## 2023-05-05 DIAGNOSIS — Z79899 Other long term (current) drug therapy: Secondary | ICD-10-CM | POA: Insufficient documentation

## 2023-05-05 DIAGNOSIS — I10 Essential (primary) hypertension: Secondary | ICD-10-CM | POA: Diagnosis not present

## 2023-05-05 LAB — URINALYSIS, ROUTINE W REFLEX MICROSCOPIC
Bacteria, UA: NONE SEEN
Bilirubin Urine: NEGATIVE
Glucose, UA: >1000 mg/dL — AB
Hgb urine dipstick: NEGATIVE
Ketones, ur: NEGATIVE mg/dL
Leukocytes,Ua: NEGATIVE
Nitrite: NEGATIVE
Protein, ur: NEGATIVE mg/dL
Specific Gravity, Urine: 1.032 — ABNORMAL HIGH (ref 1.005–1.030)
pH: 5 (ref 5.0–8.0)

## 2023-05-05 LAB — CBC
HCT: 39.2 % (ref 39.0–52.0)
Hemoglobin: 13.1 g/dL (ref 13.0–17.0)
MCH: 29.2 pg (ref 26.0–34.0)
MCHC: 33.4 g/dL (ref 30.0–36.0)
MCV: 87.3 fL (ref 80.0–100.0)
Platelets: 235 10*3/uL (ref 150–400)
RBC: 4.49 MIL/uL (ref 4.22–5.81)
RDW: 13.2 % (ref 11.5–15.5)
WBC: 6.2 10*3/uL (ref 4.0–10.5)
nRBC: 0 % (ref 0.0–0.2)

## 2023-05-05 LAB — BASIC METABOLIC PANEL
Anion gap: 9 (ref 5–15)
BUN: 37 mg/dL — ABNORMAL HIGH (ref 6–20)
CO2: 24 mmol/L (ref 22–32)
Calcium: 9.5 mg/dL (ref 8.9–10.3)
Chloride: 101 mmol/L (ref 98–111)
Creatinine, Ser: 1.9 mg/dL — ABNORMAL HIGH (ref 0.61–1.24)
GFR, Estimated: 43 mL/min — ABNORMAL LOW (ref >60)
Glucose, Bld: 456 mg/dL — ABNORMAL HIGH (ref 70–99)
Potassium: 4.3 mmol/L (ref 3.5–5.1)
Sodium: 134 mmol/L — ABNORMAL LOW (ref 135–145)

## 2023-05-05 LAB — HEPATIC FUNCTION PANEL
ALT: 21 U/L (ref 0–44)
AST: 19 U/L (ref 15–41)
Albumin: 4 g/dL (ref 3.5–5.0)
Alkaline Phosphatase: 59 U/L (ref 38–126)
Bilirubin, Direct: 0.1 mg/dL (ref 0.0–0.2)
Indirect Bilirubin: 0.2 mg/dL — ABNORMAL LOW (ref 0.3–0.9)
Total Bilirubin: 0.3 mg/dL (ref 0.3–1.2)
Total Protein: 7.4 g/dL (ref 6.5–8.1)

## 2023-05-05 LAB — LIPASE, BLOOD: Lipase: 77 U/L — ABNORMAL HIGH (ref 11–51)

## 2023-05-05 LAB — CBG MONITORING, ED: Glucose-Capillary: 236 mg/dL — ABNORMAL HIGH (ref 70–99)

## 2023-05-05 MED ORDER — MORPHINE SULFATE (PF) 4 MG/ML IV SOLN
4.0000 mg | Freq: Once | INTRAVENOUS | Status: AC
  Administered 2023-05-05: 4 mg via INTRAVENOUS
  Filled 2023-05-05: qty 1

## 2023-05-05 MED ORDER — ONDANSETRON HCL 4 MG/2ML IJ SOLN
4.0000 mg | Freq: Once | INTRAMUSCULAR | Status: AC
  Administered 2023-05-05: 4 mg via INTRAVENOUS
  Filled 2023-05-05: qty 2

## 2023-05-05 MED ORDER — IOHEXOL 300 MG/ML  SOLN
100.0000 mL | Freq: Once | INTRAMUSCULAR | Status: AC | PRN
  Administered 2023-05-05: 75 mL via INTRAVENOUS

## 2023-05-05 MED ORDER — CYCLOBENZAPRINE HCL 10 MG PO TABS: 10.0000 mg | ORAL_TABLET | Freq: Two times a day (BID) | ORAL | 0 refills | Status: AC | PRN

## 2023-05-05 MED ORDER — SODIUM CHLORIDE 0.9 % IV BOLUS
1000.0000 mL | Freq: Once | INTRAVENOUS | Status: AC
  Administered 2023-05-05: 1000 mL via INTRAVENOUS

## 2023-05-05 NOTE — ED Triage Notes (Signed)
Patient here POV from Home.  Notes Bilateral Flank Pain (Worse Right) that began Friday. No Known Fevers. No Dysuria. No Hematuria. No Known Trauma.   NAD Noted during Triage. A&OX4. GCS 15. Ambulatory.

## 2023-05-05 NOTE — Discharge Instructions (Addendum)
You have been seen today for your complaint of flank pain. Your lab work showed an elevated kidney function test which you should recheck in 5 days. Your imaging was overall reassuring and showed no acute abnormalities. Your discharge medications include Flexeril.  This is a muscle relaxer.  Take it as needed.  It may cause drowsiness.  Do not drive or operate heavy machinery while taking this medication.  Only take it at night until you know how it affects you. Tylenol.  You may take up to 1000 mg of Tylenol every 6 hours for pain. Lidocaine patches.  These are over-the-counter.  Apply 1 patch every 12 hours to the affected area Follow up with: Your primary care provider in 5 days for reevaluation of your symptoms and to recheck your blood work Please seek immediate medical care if you develop any of the following symptoms: You have trouble breathing or you are short of breath. Your abdomen hurts or it is swollen or red. You have nausea or vomiting. You feel faint, or you faint. You have blood in your urine. You have flank pain and a fever. At this time there does not appear to be the presence of an emergent medical condition, however there is always the potential for conditions to change. Please read and follow the below instructions.  Do not take your medicine if  develop an itchy rash, swelling in your mouth or lips, or difficulty breathing; call 911 and seek immediate emergency medical attention if this occurs.  You may review your lab tests and imaging results in their entirety on your MyChart account.  Please discuss all results of fully with your primary care provider and other specialist at your follow-up visit.  Note: Portions of this text may have been transcribed using voice recognition software. Every effort was made to ensure accuracy; however, inadvertent computerized transcription errors may still be present.

## 2023-05-05 NOTE — ED Provider Notes (Signed)
[place] EMERGENCY DEPARTMENT AT [facility] Provider Note   CSN: [id number] Arrival date & time: 05/05/23  1500     History  Chief Complaint  Patient presents with   Flank Pain    Paul Roberson is a 52 y.o. male.  With a history of hypertension, diabetes, depression who presents to the ED for evaluation of left-sided flank pain.  This began on Friday.  It has progressively gotten worse.  He does not have any known trauma.  No falls.  Denies recent increase in activity.  States he is urinating slightly more frequently than normal but denies any dysuria or urgency.  States he is eating and drinking and normal diet.  He did have 2 episodes of diarrhea on Saturday but has not had any since.  No fevers or chills.  No abdominal pain.  He describes the pain as a sharp and shooting sensation.  It is worse with walking, back extension and rotational movements.  Currently rates it at a 7 out of 10.  Denies chest pain or shortness of breath.  He works as a [employer]. Denies recreational drug use. Smoke one pack of cigarettes per week. Reports social alcohol use with no recent increase.   Flank Pain       Home Medications Prior to Admission medications   Medication Sig Start Date End Date Taking? Authorizing Provider  cyclobenzaprine (FLEXERIL) 10 MG tablet Take 1 tablet (10 mg total) by mouth 2 (two) times daily as needed for muscle spasms. 05/05/23  Yes Dmarco Baldus, Edsel Petrin, PA-C  amLODipine (NORVASC) 10 MG tablet Take 1 tablet (10 mg total) by mouth daily. 04/07/23   Georgina Quint, MD  Aspirin-Salicylamide-Caffeine [facility] HEADACHE POWDER PO) Take 1 packet by mouth 2 (two) times daily as needed (pain/headache).    [provider]  citalopram (CELEXA) 10 MG tablet Take 1 tablet (10 mg total) by mouth daily. 04/07/23   Georgina Quint, MD  Continuous Blood Gluc Receiver (FREESTYLE LIBRE 2 READER) DEVI Dose,Route,Frequency: As Directed Patient not taking: Reported on  04/07/2023 03/14/22   Georgina Quint, MD  Continuous Blood Gluc Sensor (FREESTYLE LIBRE 2 SENSOR) MISC Dose, Route Frequency: As Directed 03/14/22   Georgina Quint, MD  Dulaglutide (TRULICITY) 1.5 MG/0.5ML SOPN Inject 1.5 mg into the skin once a week. 05/27/22   Georgina Quint, MD  empagliflozin (JARDIANCE) 10 MG TABS tablet Take 1 tablet (10 mg total) by mouth daily. 04/07/23   Georgina Quint, MD  glipiZIDE (GLUCOTROL) 5 MG tablet Take 1 tablet (5 mg total) by mouth every morning. 04/07/23   Georgina Quint, MD  losartan-hydrochlorothiazide [facility]) 50-12.5 MG tablet Take 1 tablet by mouth daily. 04/07/23   Georgina Quint, MD  metFORMIN (GLUCOPHAGE) 1000 MG tablet Take 1 tablet (1,000 mg total) by mouth 2 (two) times daily with a meal. 04/07/23   Sagardia, Eilleen Kempf, MD  rosuvastatin (CRESTOR) 5 MG tablet Take 1 tablet (5 mg total) by mouth at bedtime. Patient not taking: Reported on 04/07/2023 05/27/22 05/22/23  Georgina Quint, MD      Allergies    Sulfa antibiotics and Penicillins    Review of Systems   Review of Systems  Genitourinary:  Positive for flank pain.  All other systems reviewed and are negative.   Physical Exam Updated Vital Signs BP (!) 136/92   Pulse 83   Temp 98.3 F (36.8 C) (Oral)   Resp 18   Ht 5\' 10"  (1.778  m)   Wt 99.8 kg   SpO2 100%   BMI 31.57 kg/m  Physical Exam Vitals and nursing note reviewed.  Constitutional:      General: He is not in acute distress.    Appearance: Normal appearance. He is well-developed. He is not ill-appearing, toxic-appearing or diaphoretic.     Comments: Resting comfortably in bed  HENT:     Head: Normocephalic and atraumatic.  Eyes:     Conjunctiva/sclera: Conjunctivae normal.  Cardiovascular:     Rate and Rhythm: Normal rate and regular rhythm.     Heart sounds: No murmur heard. Pulmonary:     Effort: Pulmonary effort is normal. No respiratory distress.     Breath sounds: Normal  breath sounds. No wheezing, rhonchi or rales.  Abdominal:     General: Abdomen is flat.     Palpations: Abdomen is soft.     Tenderness: There is no abdominal tenderness. There is no right CVA tenderness or left CVA tenderness.  Musculoskeletal:        General: No swelling.     Cervical back: Neck supple.     Comments: TTP to the left paraspinal and lumbar muscles  Skin:    General: Skin is warm and dry.     Capillary Refill: Capillary refill takes less than 2 seconds.     Comments: No rashes  Neurological:     General: No focal deficit present.     Mental Status: He is alert and oriented to person, place, and time.  Psychiatric:        Mood and Affect: Mood normal.     ED Results / Procedures / Treatments   Labs (all labs ordered are listed, but only abnormal results are displayed) Labs Reviewed  URINALYSIS, ROUTINE W REFLEX MICROSCOPIC - Abnormal; Notable for the following components:      Result Value   Color, Urine COLORLESS (*)    Specific Gravity, Urine 1.032 (*)    Glucose, UA >1,000 (*)    All other components within normal limits  BASIC METABOLIC PANEL - Abnormal; Notable for the following components:   Sodium 134 (*)    Glucose, Bld 456 (*)    BUN 37 (*)    Creatinine, Ser 1.90 (*)    GFR, Estimated 43 (*)    All other components within normal limits  HEPATIC FUNCTION PANEL - Abnormal; Notable for the following components:   Indirect Bilirubin 0.2 (*)    All other components within normal limits  LIPASE, BLOOD - Abnormal; Notable for the following components:   Lipase 77 (*)    All other components within normal limits  CBG MONITORING, ED - Abnormal; Notable for the following components:   Glucose-Capillary 236 (*)    All other components within normal limits  CBC    EKG None  Radiology CT ABDOMEN PELVIS W CONTRAST  Result Date: 05/05/2023 CLINICAL DATA:  Worsening flank pain. EXAM: CT ABDOMEN AND PELVIS WITH CONTRAST TECHNIQUE: Multidetector CT  imaging of the abdomen and pelvis was performed using the standard protocol following bolus administration of intravenous contrast. RADIATION DOSE REDUCTION: This exam was performed according to the departmental dose-optimization program which includes automated exposure control, adjustment of the mA and/or kV according to patient size and/or use of iterative reconstruction technique. CONTRAST:  75mL OMNIPAQUE IOHEXOL 300 MG/ML  SOLN COMPARISON:  CT 05/18/2022 and older. FINDINGS: Lower chest: There is some linear opacity lung bases likely scar or atelectasis. No pleural effusion. Hepatobiliary: Mild fatty  liver infiltration. Patent portal vein. Gallbladder is nondilated. Pancreas: Unremarkable. No pancreatic ductal dilatation or surrounding inflammatory changes. Spleen: Normal in size without focal abnormality.  Small splenule. Adrenals/Urinary Tract: Adrenal glands are preserved. Slightly lobular contours of the kidneys, nonspecific. No enhancing renal mass or collecting system dilatation. The ureters have normal course and caliber extending down to the bladder. Preserved contours of the urinary bladder. Stomach/Bowel: Moderate debris in the stomach. Stomach has a normal course and caliber. Small bowel has a normal course and caliber. Large bowel is of normal course and caliber with scattered stool. Normal appendix. Vascular/Lymphatic: Aortic atherosclerosis. No enlarged abdominal or pelvic lymph nodes. Reproductive: Prostate is unremarkable. Other: No abdominal wall hernia or abnormality. No abdominopelvic ascites. There is a calcified lymph node seen above the urinary bladder, possibly sequela of old infectious or inflammatory process. Unchanged from previous. Musculoskeletal: Moderate degenerative changes of the spine and pelvis. There is multilevel disc bulging and osteophytes along the lumbar spine with areas of significant canal stenosis including L4-5 and L5-S1. Bridging osteophytes along the sacroiliac  joints. IMPRESSION: No bowel obstruction, free air or free fluid. Scattered stool. Normal appendix. Mild fatty liver infiltration. Moderate degenerative changes of the spine with areas of canal stenosis in the lumbar region. Please correlate with any symptoms. Electronically Signed   By: Karen Kays M.D.   On: 05/05/2023 17:57    Procedures Procedures    Medications Ordered in ED Medications  sodium chloride 0.9 % bolus 1,000 mL (0 mLs Intravenous Stopped 05/05/23 1836)  morphine (PF) 4 MG/ML injection 4 mg (4 mg Intravenous Given 05/05/23 1650)  iohexol (OMNIPAQUE) 300 MG/ML solution 100 mL (75 mLs Intravenous Contrast Given 05/05/23 1712)  ondansetron (ZOFRAN) injection 4 mg (4 mg Intravenous Given 05/05/23 1726)    ED Course/ Medical Decision Making/ A&P                             Medical Decision Making Amount and/or Complexity of Data Reviewed Labs: ordered. Radiology: ordered.  Risk Prescription drug management.  This patient presents to the ED for concern of left flank pain, this involves an extensive number of treatment options, and is a complaint that carries with it a high risk of complications and morbidity.  The differential diagnosis of emergent flank pain includes, but is not limited to :Abdominal aortic aneurysm,, Renal artery embolism,Renal vein thrombosis, Aortic dissection, Mesenteric ischemia, Pyelonephritis, Renal infarction, Renal hemorrhage, Nephrolithiasis/ Renal Colic, Bladder tumor,Cystitis, Biliary colic, Pancreatitis Perforated peptic ulcer Appendicitis ,Inguinal Hernia, Diverticulitis, Bowel obstruction Testicular torsion,Epididymitis Shingles Lower lobe pneumonia, Retroperitoneal hematoma/abscess/tumor, Epidural abscess, Epidural hematoma   Co morbidities that complicate the patient evaluation   hypertension, diabetes, depression  My initial workup includes labs, symptom control, imaging  Additional history obtained from: Nursing notes from this  visit. Family mother is at bedside and provides a portion of the history  I ordered, reviewed and interpreted labs which include: CBC, CMP, lipase, urinalysis.  Metabolic panel with hyponatremia of 134, hyperglycemia of 456 with a normal anion gap, BUN elevated at 37 with a creatinine elevated at 1.9 with a baseline of 1.4 suggesting some dehydration.  I ordered imaging studies including CT abdomen pelvis I independently visualized and interpreted imaging which showed no acute abnormalities, moderate degenerative changes of the spine with areas of canal stenosis in the lumbar region I agree with the radiologist interpretation  Afebrile, hemodynamically stable.  52 year old male presenting to the ED for evaluation  of left-sided flank pain.  He appears very well on physical exam.  There is some tenderness to palpation of the left paraspinal and lumbar musculature.  Pain is predictably reproducible.  Overall I suspect a musculoskeletal issue for his pain.  His lab workup revealed some evidence of dehydration which was treated in the ED.  Does not meet acute kidney injury criteria but was strongly encouraged to follow-up closely with his primary care provider for recheck of his creatinine. This may be secondary to his hyperglycemia as well. He states his blood sugar is typically 170-190 and he reports compliance with his medications. He may need some adjustment of his regimen.  His imaging workup was reassuring as well.  He was given a prescription for Flexeril for her symptoms and educated on appropriate use and potential side effects.  He was given return precautions.  Stable at discharge.  At this time there does not appear to be any evidence of an acute emergency medical condition and the patient appears stable for discharge with appropriate outpatient follow up. Diagnosis was discussed with patient who verbalizes understanding of care plan and is agreeable to discharge. I have discussed return  precautions with patient and mother who verbalizes understanding. Patient encouraged to follow-up with their PCP within 1 week. All questions answered.  Note: Portions of this report may have been transcribed using voice recognition software. Every effort was made to ensure accuracy; however, inadvertent computerized transcription errors may still be present.        Final Clinical Impression(s) / ED Diagnoses Final diagnoses:  Flank pain  Elevated serum creatinine    Rx / DC Orders ED Discharge Orders          Ordered    cyclobenzaprine (FLEXERIL) 10 MG tablet  2 times daily PRN        05/05/23 1853              Michelle Piper, PA-C 05/05/23 1854    Terrilee Files, MD 05/05/23 2151

## 2023-05-05 NOTE — ED Notes (Signed)
Emesis x1 in CT after contrast. PA notified- see MAR for orders. Lung sounds clear and pt remains free of itching/rash.

## 2023-05-12 ENCOUNTER — Ambulatory Visit (INDEPENDENT_AMBULATORY_CARE_PROVIDER_SITE_OTHER): Payer: BC Managed Care – PPO | Admitting: Emergency Medicine

## 2023-05-12 ENCOUNTER — Encounter: Payer: Self-pay | Admitting: Emergency Medicine

## 2023-05-12 VITALS — BP 128/64 | HR 100 | Temp 97.8°F | Ht 70.0 in | Wt 221.5 lb

## 2023-05-12 DIAGNOSIS — Z7984 Long term (current) use of oral hypoglycemic drugs: Secondary | ICD-10-CM

## 2023-05-12 DIAGNOSIS — I152 Hypertension secondary to endocrine disorders: Secondary | ICD-10-CM | POA: Diagnosis not present

## 2023-05-12 DIAGNOSIS — E1165 Type 2 diabetes mellitus with hyperglycemia: Secondary | ICD-10-CM | POA: Diagnosis not present

## 2023-05-12 DIAGNOSIS — F331 Major depressive disorder, recurrent, moderate: Secondary | ICD-10-CM

## 2023-05-12 DIAGNOSIS — M48061 Spinal stenosis, lumbar region without neurogenic claudication: Secondary | ICD-10-CM | POA: Diagnosis not present

## 2023-05-12 DIAGNOSIS — E1159 Type 2 diabetes mellitus with other circulatory complications: Secondary | ICD-10-CM

## 2023-05-12 DIAGNOSIS — F17200 Nicotine dependence, unspecified, uncomplicated: Secondary | ICD-10-CM

## 2023-05-12 LAB — COMPREHENSIVE METABOLIC PANEL
ALT: 20 U/L (ref 0–53)
AST: 18 U/L (ref 0–37)
Albumin: 4 g/dL (ref 3.5–5.2)
Alkaline Phosphatase: 65 U/L (ref 39–117)
BUN: 26 mg/dL — ABNORMAL HIGH (ref 6–23)
CO2: 27 mEq/L (ref 19–32)
Calcium: 9.7 mg/dL (ref 8.4–10.5)
Chloride: 100 mEq/L (ref 96–112)
Creatinine, Ser: 1.75 mg/dL — ABNORMAL HIGH (ref 0.40–1.50)
GFR: 45.03 mL/min — ABNORMAL LOW (ref >60.00)
Glucose, Bld: 343 mg/dL — ABNORMAL HIGH (ref 70–99)
Potassium: 4.2 mEq/L (ref 3.5–5.1)
Sodium: 135 mEq/L (ref 135–145)
Total Bilirubin: 0.4 mg/dL (ref 0.2–1.2)
Total Protein: 7.3 g/dL (ref 6.0–8.3)

## 2023-05-12 LAB — MICROALBUMIN / CREATININE URINE RATIO
Creatinine,U: 207.6 mg/dL
Microalb Creat Ratio: 2.1 mg/g (ref 0.0–30.0)
Microalb, Ur: 4.3 mg/dL — ABNORMAL HIGH (ref 0.0–1.9)

## 2023-05-12 MED ORDER — TRULICITY 1.5 MG/0.5ML ~~LOC~~ SOAJ
1.5000 mg | SUBCUTANEOUS | 3 refills | Status: DC
Start: 2023-05-12 — End: 2023-07-08

## 2023-05-12 NOTE — Assessment & Plan Note (Signed)
Recent CT of abdomen and pelvis revealed spinal stenosis of lumbar region This is accounting for chronic lumbar pain Recently prescribed Flexeril in the emergency department Needs orthopedic evaluation. Referral placed today.

## 2023-05-12 NOTE — Assessment & Plan Note (Signed)
Cardiovascular and cancer risk associated with smoking discussed Smoking cessation advised.

## 2023-05-12 NOTE — Progress Notes (Signed)
Paul Roberson 52 y.o.   Chief Complaint  Patient presents with   Follow-up    Patient was seen in ED for flank pain, patient states he is still in pain     HISTORY OF PRESENT ILLNESS: This is a 52 y.o. male here for follow-up of emergency department visit on 05/05/2023 when he presented with flank pain Negative workup.  CT scan of abdomen and pelvis showed spinal stenosis of lumbar spine. Blood work revealed blood glucose over 400 with dehydration and diminished GFR Patient is diabetic and hypertensive On multiple medications.  Compliant History of depression on Celexa 10 mg.  Recently scheduled appointment to see psychiatrist Accompanied by mother today.  HPI   Prior to Admission medications   Medication Sig Start Date End Date Taking? Authorizing Provider  amLODipine (NORVASC) 10 MG tablet Take 1 tablet (10 mg total) by mouth daily. 04/07/23  Yes Arrow Emmerich, Eilleen Kempf, MD  Aspirin-Salicylamide-Caffeine [facility] HEADACHE POWDER PO) Take 1 packet by mouth 2 (two) times daily as needed (pain/headache).   Yes [provider]  citalopram (CELEXA) 10 MG tablet Take 1 tablet (10 mg total) by mouth daily. 04/07/23  Yes Nancye Grumbine, Eilleen Kempf, MD  Continuous Blood Gluc Sensor (FREESTYLE LIBRE 2 SENSOR) MISC Dose, Route Frequency: As Directed 03/14/22  Yes Taeko Schaffer, Eilleen Kempf, MD  cyclobenzaprine (FLEXERIL) 10 MG tablet Take 1 tablet (10 mg total) by mouth 2 (two) times daily as needed for muscle spasms. 05/05/23  Yes Schutt, Edsel Petrin, PA-C  Dulaglutide (TRULICITY) 1.5 MG/0.5ML SOPN Inject 1.5 mg into the skin once a week. 05/27/22  Yes Delorese Sellin, Eilleen Kempf, MD  empagliflozin (JARDIANCE) 10 MG TABS tablet Take 1 tablet (10 mg total) by mouth daily. 04/07/23  Yes Cyruss Arata, Eilleen Kempf, MD  glipiZIDE (GLUCOTROL) 5 MG tablet Take 1 tablet (5 mg total) by mouth every morning. 04/07/23  Yes Kittie Krizan, Eilleen Kempf, MD  losartan-hydrochlorothiazide (HYZAAR) 50-12.5 MG tablet Take 1 tablet by mouth  daily. 04/07/23  Yes Georgina Quint, MD  metFORMIN (GLUCOPHAGE) 1000 MG tablet Take 1 tablet (1,000 mg total) by mouth 2 (two) times daily with a meal. 04/07/23  Yes Bess Saltzman, Eilleen Kempf, MD  Continuous Blood Gluc Receiver (FREESTYLE LIBRE 2 READER) DEVI Dose,Route,Frequency: As Directed Patient not taking: Reported on 05/12/2023 03/14/22   Georgina Quint, MD  rosuvastatin (CRESTOR) 5 MG tablet Take 1 tablet (5 mg total) by mouth at bedtime. Patient not taking: Reported on 04/07/2023 05/27/22 05/22/23  Georgina Quint, MD    Allergies  Allergen Reactions   Sulfa Antibiotics Other (See Comments)    Causes real bad headache   Penicillins Itching    Patient Active Problem List   Diagnosis Date Noted   Moderate episode of recurrent major depressive disorder (HCC) 04/07/2023   Current smoker 07/01/2022   Chronic anemia 05/27/2022   Hypertension associated with type 2 diabetes mellitus (HCC) 05/28/2017   Type 2 diabetes mellitus without complication, without long-term current use of insulin (HCC) 05/28/2017    Past Medical History:  Diagnosis Date   Diabetes mellitus without complication (HCC)    Hypertension     No past surgical history on file.  Social History   Socioeconomic History   Marital status: Married    Spouse name: Not on file   Number of children: Not on file   Years of education: Not on file   Highest education level: Not on file  Occupational History   Not on file  Tobacco Use  Smoking status: Some Days    Types: Cigarettes   Smokeless tobacco: Never  Vaping Use   Vaping Use: Never used  Substance and Sexual Activity   Alcohol use: Not Currently   Drug use: No   Sexual activity: Yes  Other Topics Concern   Not on file  Social History Narrative   Not on file   Social Determinants of Health   Financial Resource Strain: Not on file  Food Insecurity: Not on file  Transportation Needs: No Transportation Needs (06/05/2022)   PRAPARE -  Transportation    Lack of Transportation (Medical): No    Lack of Transportation (Non-Medical): No  Physical Activity: Not on file  Stress: Not on file  Social Connections: Not on file  Intimate Partner Violence: Not on file    Family History  Problem Relation Age of Onset   Diabetes Mother    Heart disease Mother    Hypertension Mother    Asthma Mother    Diabetes Father    Heart disease Father    Hypertension Father    Kidney disease Father    Cancer Father      Review of Systems  Constitutional: Negative.  Negative for chills and fever.  HENT:  Negative for congestion and sore throat.   Respiratory: Negative.  Negative for cough and shortness of breath.   Cardiovascular: Negative.  Negative for chest pain and palpitations.  Gastrointestinal:  Negative for abdominal pain, diarrhea, nausea and vomiting.  Genitourinary: Negative.  Negative for dysuria and hematuria.  Skin: Negative.  Negative for rash.  Neurological: Negative.  Negative for dizziness and headaches.  All other systems reviewed and are negative.   Vitals:   05/12/23 1305  BP: 128/64  Pulse: 100  Temp: 97.8 F (36.6 C)  SpO2: 98%    Physical Exam Vitals reviewed.  Constitutional:      Appearance: Normal appearance.  HENT:     Head: Normocephalic.  Eyes:     Extraocular Movements: Extraocular movements intact.  Cardiovascular:     Rate and Rhythm: Normal rate and regular rhythm.     Pulses: Normal pulses.     Heart sounds: Normal heart sounds.  Pulmonary:     Effort: Pulmonary effort is normal.     Breath sounds: Normal breath sounds.  Musculoskeletal:     Cervical back: No tenderness.  Lymphadenopathy:     Cervical: No cervical adenopathy.  Skin:    General: Skin is warm and dry.  Neurological:     Mental Status: He is alert and oriented to person, place, and time.  Psychiatric:        Mood and Affect: Mood normal.        Behavior: Behavior normal.      ASSESSMENT & PLAN: A  total of 45 minutes was spent with the patient and counseling/coordination of care regarding preparing for this visit, review of most recent office visit notes, review of most recent emergency department visit notes, review of most recent imaging reports showing spinal stenosis, review of chronic medical conditions under management, cardiovascular risks associated with uncontrolled diabetes, review of all medications and changes made, review of most recent blood work results, education on nutrition, diagnosis of depression and need for psychiatric follow-up, prognosis, documentation, and need for follow-up.  Problem List Items Addressed This Visit       Cardiovascular and Mediastinum   Hypertension associated with type 2 diabetes mellitus (HCC)    BP Readings from Last 3 Encounters:  05/12/23  128/64  05/05/23 124/71  04/07/23 (!) 160/110  Well-controlled hypertension Continue amlodipine 10 mg and Hyzaar 50-12.5 mg daily Uncontrolled diabetes with recent episodes of hyperglycemia Continue metformin 1000 mg twice a day, glipizide 5 mg in the morning, Jardiance 10 mg Trulicity was not dispensed.  Will send to pharmacy again Cardiovascular risks associated with uncontrolled diabetes discussed Diet and nutrition discussed Continue rosuvastatin 5 mg daily Will follow-up in 3 months       Relevant Medications   Dulaglutide (TRULICITY) 1.5 MG/0.5ML SOPN   Other Relevant Orders   Comprehensive metabolic panel (Completed)   POCT glucose (manual entry)     Other   Current smoker    Cardiovascular and cancer risk associated with smoking discussed Smoking cessation advised.      Moderate episode of recurrent major depressive disorder (HCC)    Was able to schedule psychiatry appointment for next August Continue Celexa 10 mg daily      Spinal stenosis of lumbar region    Recent CT of abdomen and pelvis revealed spinal stenosis of lumbar region This is accounting for chronic lumbar  pain Recently prescribed Flexeril in the emergency department Needs orthopedic evaluation. Referral placed today.      Relevant Orders   Ambulatory referral to Orthopedic Surgery   Other Visit Diagnoses     Uncontrolled type 2 diabetes mellitus with hyperglycemia (HCC)    -  Primary   Relevant Medications   Dulaglutide (TRULICITY) 1.5 MG/0.5ML SOPN   Other Relevant Orders   Comprehensive metabolic panel (Completed)   POCT glucose (manual entry)   Urine Microalbumin w/creat. ratio (Completed)      Patient Instructions  Spinal Stenosis  Spinal stenosis happens when the spinal canal gets smaller. The spinal canal is the space between the bones of your spine (vertebrae). As the canal gets smaller, the nerves that pass that part of the spine are pressed. This causes pain, weakness, or loss of feeling (numbness) in your arms or legs. Spinal stenosis can affect your neck, upper back, or lower back. What are the causes? This condition is caused by parts of bone that push into your spinal canal. This problem may be present at birth. If it occurs after birth, the cause may be: Breakdown of bones of your spine. This normally starts between 51 and 55 years of age. Injury to your spine. Any surgeries you have had to your spine. Tumors in your spine. A buildup of calcium in your spine. What increases the risk? You are more likely to develop this condition if: You are older than age 29. You were born with a problem in your spine, such as a curved spine (scoliosis). You have arthritis. This is disease of your joints. What are the signs or symptoms? Common symptoms of this condition include: Pain in your neck or back. The pain may be worse when you stand or walk. Problems with your legs or arms. A leg or arm may lose feeling, tingle, or turn hot or cold. This can happen in one arm or leg, or both. Pain that goes from your butt down to your lower leg (sciatica). This can happen in one or both  legs. Falling a lot. Foot drop. This is when you have trouble lifting the front part of your foot and it drags on the ground when you walk. Severe symptoms of this condition include: Problems pooping (having a bowel movement) or peeing (urinating). Trouble having sex. Loss of feeling in your leg. Being unable to walk.  Symptoms may come on slowly and get worse over time. Sometimes there are no symptoms. How is this treated? To treat pain and manage symptoms, you may be asked to: Practice sitting and standing up straight. This is good posture. Do exercises. Lose weight, if needed. Take medicines or get shots. Wear a corset or a brace. This supports your back. In some cases, you may need to have surgery. Follow these instructions at home: Managing pain, stiffness, and swelling  Stand and sit up straight. If you have a brace or a corset, wear it as told by your doctor. Keep a healthy weight. Talk with your doctor if you need help losing weight. If told, put heat on the affected area. Do this as often as told by your doctor. Use the heat source that your doctor recommends, such as a moist heat pack or a heating pad. Place a towel between your skin and the heat source. Leave the heat on for 20-30 minutes. If your skin turns bright red, take off the heat right away to prevent burns. The risk of burns is higher if you cannot feel pain, heat, or cold. Activity Do exercises as told by your doctor. Do not do anything that causes pain. Ask your doctor what activities are safe for you. You may have to avoid lifting. Ask your doctor how much you can lift. Return to your normal activities when your doctor says that it is safe. General instructions Take over-the-counter and prescription medicines only as told by your doctor. Do not smoke or use any products that contain nicotine or tobacco. If you need help quitting, ask your doctor. Eat a healthy diet. Eat a lot of: Fruits. Vegetables. Whole  grains. Low-fat (lean) protein. Where to find more information [employer] of Arthritis and Musculoskeletal and Skin Diseases: www.niams.http://www.myers.net/ Contact a doctor if: Your symptoms do not get better. Your symptoms get worse. You have a fever. Get help right away if: You have new pain or very bad pain in your neck or upper back. You have very bad pain, and medicine does not help. You have a very bad headache. You are dizzy. You do not see well. You vomit or feel like you may vomit. You have these things in your back or legs: New or worse loss of feeling. New or worse tingling. You cannot control when you poop or pee. Your arm or leg: Hurts or swells. Turns red. Feels warm. These symptoms may be an emergency. Get help right away. Call 911. Do not wait to see if the symptoms will go away. Do not drive yourself to the hospital. Summary Spinal stenosis happens when the space between the bones of the spine gets smaller. This condition may be present at birth. Spinal stenosis can cause pain in the neck, back, legs, or butt. Treatment for this condition focuses on lessening your pain and other symptoms. This information is not intended to replace advice given to you by your health care provider. Make sure you discuss any questions you have with your health care provider. Document Revised: 02/10/2022 Document Reviewed: 01/28/2022 Elsevier Patient Education  2024 Elsevier Inc.     Edwina Barth, MD [facility]

## 2023-05-12 NOTE — Assessment & Plan Note (Signed)
Was able to schedule psychiatry appointment for next August Continue Celexa 10 mg daily

## 2023-05-12 NOTE — Patient Instructions (Signed)
Spinal Stenosis  Spinal stenosis happens when the spinal canal gets smaller. The spinal canal is the space between the bones of your spine (vertebrae). As the canal gets smaller, the nerves that pass that part of the spine are pressed. This causes pain, weakness, or loss of feeling (numbness) in your arms or legs. Spinal stenosis can affect your neck, upper back, or lower back. What are the causes? This condition is caused by parts of bone that push into your spinal canal. This problem may be present at birth. If it occurs after birth, the cause may be: Breakdown of bones of your spine. This normally starts between 16 and 80 years of age. Injury to your spine. Any surgeries you have had to your spine. Tumors in your spine. A buildup of calcium in your spine. What increases the risk? You are more likely to develop this condition if: You are older than age 88. You were born with a problem in your spine, such as a curved spine (scoliosis). You have arthritis. This is disease of your joints. What are the signs or symptoms? Common symptoms of this condition include: Pain in your neck or back. The pain may be worse when you stand or walk. Problems with your legs or arms. A leg or arm may lose feeling, tingle, or turn hot or cold. This can happen in one arm or leg, or both. Pain that goes from your butt down to your lower leg (sciatica). This can happen in one or both legs. Falling a lot. Foot drop. This is when you have trouble lifting the front part of your foot and it drags on the ground when you walk. Severe symptoms of this condition include: Problems pooping (having a bowel movement) or peeing (urinating). Trouble having sex. Loss of feeling in your leg. Being unable to walk. Symptoms may come on slowly and get worse over time. Sometimes there are no symptoms. How is this treated? To treat pain and manage symptoms, you may be asked to: Practice sitting and standing up straight. This is  good posture. Do exercises. Lose weight, if needed. Take medicines or get shots. Wear a corset or a brace. This supports your back. In some cases, you may need to have surgery. Follow these instructions at home: Managing pain, stiffness, and swelling  Stand and sit up straight. If you have a brace or a corset, wear it as told by your doctor. Keep a healthy weight. Talk with your doctor if you need help losing weight. If told, put heat on the affected area. Do this as often as told by your doctor. Use the heat source that your doctor recommends, such as a moist heat pack or a heating pad. Place a towel between your skin and the heat source. Leave the heat on for 20-30 minutes. If your skin turns bright red, take off the heat right away to prevent burns. The risk of burns is higher if you cannot feel pain, heat, or cold. Activity Do exercises as told by your doctor. Do not do anything that causes pain. Ask your doctor what activities are safe for you. You may have to avoid lifting. Ask your doctor how much you can lift. Return to your normal activities when your doctor says that it is safe. General instructions Take over-the-counter and prescription medicines only as told by your doctor. Do not smoke or use any products that contain nicotine or tobacco. If you need help quitting, ask your doctor. Eat a healthy diet. Eat  a lot of: Fruits. Vegetables. Whole grains. Low-fat (lean) protein. Where to find more information [employer] of Arthritis and Musculoskeletal and Skin Diseases: www.niams.http://www.myers.net/ Contact a doctor if: Your symptoms do not get better. Your symptoms get worse. You have a fever. Get help right away if: You have new pain or very bad pain in your neck or upper back. You have very bad pain, and medicine does not help. You have a very bad headache. You are dizzy. You do not see well. You vomit or feel like you may vomit. You have these things in your back or  legs: New or worse loss of feeling. New or worse tingling. You cannot control when you poop or pee. Your arm or leg: Hurts or swells. Turns red. Feels warm. These symptoms may be an emergency. Get help right away. Call 911. Do not wait to see if the symptoms will go away. Do not drive yourself to the hospital. Summary Spinal stenosis happens when the space between the bones of the spine gets smaller. This condition may be present at birth. Spinal stenosis can cause pain in the neck, back, legs, or butt. Treatment for this condition focuses on lessening your pain and other symptoms. This information is not intended to replace advice given to you by your health care provider. Make sure you discuss any questions you have with your health care provider. Document Revised: 02/10/2022 Document Reviewed: 01/28/2022 Elsevier Patient Education  2024 [employer].

## 2023-05-12 NOTE — Assessment & Plan Note (Addendum)
BP Readings from Last 3 Encounters:  05/12/23 128/64  05/05/23 124/71  04/07/23 (!) 160/110  Well-controlled hypertension Continue amlodipine 10 mg and Hyzaar 50-12.5 mg daily Uncontrolled diabetes with recent episodes of hyperglycemia Continue metformin 1000 mg twice a day, glipizide 5 mg in the morning, Jardiance 10 mg Trulicity was not dispensed.  Will send to pharmacy again Cardiovascular risks associated with uncontrolled diabetes discussed Diet and nutrition discussed Continue rosuvastatin 5 mg daily Will follow-up in 3 months

## 2023-06-11 ENCOUNTER — Other Ambulatory Visit (INDEPENDENT_AMBULATORY_CARE_PROVIDER_SITE_OTHER): Payer: BC Managed Care – PPO

## 2023-06-11 ENCOUNTER — Ambulatory Visit (INDEPENDENT_AMBULATORY_CARE_PROVIDER_SITE_OTHER): Payer: BC Managed Care – PPO | Admitting: Orthopedic Surgery

## 2023-06-11 VITALS — BP 105/71 | HR 98 | Ht 70.0 in | Wt 222.0 lb

## 2023-06-11 DIAGNOSIS — M5136 Other intervertebral disc degeneration, lumbar region: Secondary | ICD-10-CM

## 2023-06-11 DIAGNOSIS — M48061 Spinal stenosis, lumbar region without neurogenic claudication: Secondary | ICD-10-CM

## 2023-06-11 DIAGNOSIS — M2578 Osteophyte, vertebrae: Secondary | ICD-10-CM | POA: Diagnosis not present

## 2023-06-11 NOTE — Progress Notes (Signed)
Orthopedic Spine Surgery Office Note  Assessment: Patient is a 52 y.o. male with low back pain mostly on the left side.  No radicular symptoms.   Plan: -Explained that initially conservative treatment is tried as a significant number of patients may experience relief with these treatment modalities. Discussed that the conservative treatments include:  -activity modification  -physical therapy  -over the counter pain medications  -medrol dosepak  -lumbar steroid injections -Patient has tried Tylenol, ibuprofen -Instructed him to use Tylenol 1000 mg 3 times daily when he is having pain and he can use Aleve for flares of pain but should not use the Aleve chronically -I told him that if he gets a worse flare of pain where Tylenol or Aleve is not controlling it, I would prescribe him a muscle relaxer or a Medrol Dosepak -Provided him with a core strengthening exercise program that he should do 3-4 times per week -Would need to be nicotine free and have an A1c of 7.5 or less prior to any elective spine surgery -Patient should return to office on an as-needed basis   Patient expressed understanding of the plan and all questions were answered to the patient's satisfaction.   ___________________________________________________________________________   History:  Patient is a 52 y.o. male who presents today for lumbar spine.  Patient has had several years of on and off back pain.  He states he recently developed some left-sided lower back pain.  There is no trauma or injury that preceded the onset of pain.  He does not have any pain radiating into either lower extremity.  There is no specific activities that bring on the pain or make it go away.  He just goes through periods where he has the low back pain.  He feels that Tylenol and NSAIDs have been helpful in controlling the pain.  He was more recently prescribed a muscle relaxer which she also found helpful.  Right now, he is not having much back  pain.   Weakness: Denies Symptoms of imbalance: Denies Paresthesias and numbness: Denies Bowel or bladder incontinence: Denies Saddle anesthesia: Denies  Treatments tried: Tylenol, ibuprofen  Review of systems: Denies fevers and chills, night sweats, unexplained weight loss, history of cancer, pain that wakes them at night  Past medical history: HTN Depression GERD DM (last A1c was at 8.7 on 04/07/2023)  Allergies: Sulfa, penicillin  Past surgical history:  None  Social history: Reports use of nicotine product (smoking, vaping, patches, smokeless) Alcohol use: Denies Denies recreational drug use   Physical Exam:  BMI of 31.9  General: no acute distress, appears stated age Neurologic: alert, answering questions appropriately, following commands Respiratory: unlabored breathing on room air, symmetric chest rise Psychiatric: appropriate affect, normal cadence to speech   MSK (spine):  -Strength exam      Left  Right EHL    5/5  5/5 TA    5/5  5/5 GSC    5/5  5/5 Knee extension  5/5  5/5 Hip flexion   5/5  5/5  -Sensory exam    Sensation intact to light touch in L3-S1 nerve distributions of bilateral lower extremities  -Achilles DTR: 2/4 on the left, 2/4 on the right -Patellar tendon DTR: 2/4 on the left, 2/4 on the right  -Straight leg raise: Negative bilaterally -Femoral nerve stretch test: Negative bilaterally  -Clonus: no beats bilaterally  -Left hip exam: No pain through range of motion, negative Stinchfield, negative FABER, negative SI joint compression test -Right hip exam: No pain through  range of motion, negative Stinchfield, negative FABER, negative SI joint compression test  Imaging: XR of the lumbar spine from 06/11/2023 was independently reviewed and interpreted, showing disc height loss and anterior osteophyte formation at L3/4 and L4/5.  No evidence of instability on flexion/extension views.  No fracture or dislocation seen.   Patient  name: Paul Roberson Patient MRN: [id number] Date of visit: 06/11/23

## 2023-06-17 ENCOUNTER — Ambulatory Visit (HOSPITAL_COMMUNITY): Payer: Self-pay | Admitting: Psychiatry

## 2023-06-18 ENCOUNTER — Ambulatory Visit (HOSPITAL_COMMUNITY): Payer: Self-pay | Admitting: Psychiatry

## 2023-07-08 ENCOUNTER — Encounter: Payer: Self-pay | Admitting: Emergency Medicine

## 2023-07-08 ENCOUNTER — Ambulatory Visit (INDEPENDENT_AMBULATORY_CARE_PROVIDER_SITE_OTHER): Payer: BC Managed Care – PPO | Admitting: Emergency Medicine

## 2023-07-08 VITALS — BP 142/92 | HR 105 | Temp 98.7°F | Ht 70.0 in | Wt 224.1 lb

## 2023-07-08 DIAGNOSIS — I152 Hypertension secondary to endocrine disorders: Secondary | ICD-10-CM | POA: Diagnosis not present

## 2023-07-08 DIAGNOSIS — F17200 Nicotine dependence, unspecified, uncomplicated: Secondary | ICD-10-CM

## 2023-07-08 DIAGNOSIS — M48061 Spinal stenosis, lumbar region without neurogenic claudication: Secondary | ICD-10-CM

## 2023-07-08 DIAGNOSIS — E1159 Type 2 diabetes mellitus with other circulatory complications: Secondary | ICD-10-CM

## 2023-07-08 DIAGNOSIS — Z23 Encounter for immunization: Secondary | ICD-10-CM | POA: Diagnosis not present

## 2023-07-08 DIAGNOSIS — Z7984 Long term (current) use of oral hypoglycemic drugs: Secondary | ICD-10-CM

## 2023-07-08 LAB — POCT GLYCOSYLATED HEMOGLOBIN (HGB A1C): Hemoglobin A1C: 8.7 % — AB (ref 4.0–5.6)

## 2023-07-08 MED ORDER — ROSUVASTATIN CALCIUM 5 MG PO TABS
5.0000 mg | ORAL_TABLET | Freq: Every day | ORAL | 3 refills | Status: DC
Start: 2023-07-08 — End: 2024-01-14

## 2023-07-08 MED ORDER — EMPAGLIFLOZIN 10 MG PO TABS: 10.0000 mg | ORAL_TABLET | Freq: Every day | ORAL | 1 refills | Status: DC

## 2023-07-08 MED ORDER — TRULICITY 1.5 MG/0.5ML ~~LOC~~ SOAJ
1.5000 mg | SUBCUTANEOUS | 3 refills | Status: DC
Start: 2023-07-08 — End: 2024-01-20

## 2023-07-08 NOTE — Progress Notes (Signed)
Paul Roberson 52 y.o.   Chief Complaint  Patient presents with   Annual Exam    Patient states he is still having some pain in his back, was seen by a specialist,     HISTORY OF PRESENT ILLNESS: This is a 52 y.o. male here for follow-up of diabetes and hypertension Was also seen by orthopedist recently.  Assessment and plan as follows: Assessment: Patient is a 52 y.o. male with low back pain mostly on the left side.  No radicular symptoms.     Plan: -Explained that initially conservative treatment is tried as a significant number of patients may experience relief with these treatment modalities. Discussed that the conservative treatments include:             -activity modification             -physical therapy             -over the counter pain medications             -medrol dosepak             -lumbar steroid injections -Patient has tried Tylenol, ibuprofen -Instructed him to use Tylenol 1000 mg 3 times daily when he is having pain and he can use Aleve for flares of pain but should not use the Aleve chronically -I told him that if he gets a worse flare of pain where Tylenol or Aleve is not controlling it, I would prescribe him a muscle relaxer or a Medrol Dosepak -Provided him with a core strengthening exercise program that he should do 3-4 times per week -Would need to be nicotine free and have an A1c of 7.5 or less prior to any elective spine surgery -Patient should return to office on an as-needed basis  Lab Results  Component Value Date   HGBA1C 8.7 (A) 04/07/2023  Overall doing well. No other complaints or medical concerns today.   HPI   Prior to Admission medications   Medication Sig Start Date End Date Taking? Authorizing Provider  amLODipine (NORVASC) 10 MG tablet Take 1 tablet (10 mg total) by mouth daily. 04/07/23  Yes Madonna Flegal, Eilleen Kempf, MD  Aspirin-Salicylamide-Caffeine [facility] HEADACHE POWDER PO) Take 1 packet by mouth 2 (two) times daily as needed  (pain/headache).   Yes [provider]  citalopram (CELEXA) 10 MG tablet Take 1 tablet (10 mg total) by mouth daily. 04/07/23  Yes Delara Shepheard, Eilleen Kempf, MD  cyclobenzaprine (FLEXERIL) 10 MG tablet Take 1 tablet (10 mg total) by mouth 2 (two) times daily as needed for muscle spasms. 05/05/23  Yes Schutt, Edsel Petrin, PA-C  Dulaglutide (TRULICITY) 1.5 MG/0.5ML SOPN Inject 1.5 mg into the skin once a week. 05/12/23  Yes Bonny Egger, Eilleen Kempf, MD  empagliflozin (JARDIANCE) 10 MG TABS tablet Take 1 tablet (10 mg total) by mouth daily. 04/07/23  Yes Julane Crock, Eilleen Kempf, MD  glipiZIDE (GLUCOTROL) 5 MG tablet Take 1 tablet (5 mg total) by mouth every morning. 04/07/23  Yes Ayce Pietrzyk, Eilleen Kempf, MD  losartan-hydrochlorothiazide (HYZAAR) 50-12.5 MG tablet Take 1 tablet by mouth daily. 04/07/23  Yes Georgina Quint, MD  metFORMIN (GLUCOPHAGE) 1000 MG tablet Take 1 tablet (1,000 mg total) by mouth 2 (two) times daily with a meal. 04/07/23  Yes Marketta Valadez, Eilleen Kempf, MD  Continuous Blood Gluc Receiver (FREESTYLE LIBRE 2 READER) DEVI Dose,Route,Frequency: As Directed Patient not taking: Reported on 05/12/2023 03/14/22   Georgina Quint, MD  Continuous Blood Gluc Sensor (FREESTYLE LIBRE 2 SENSOR) MISC  Dose, Route Frequency: As Directed Patient not taking: Reported on 07/08/2023 03/14/22   Georgina Quint, MD  rosuvastatin (CRESTOR) 5 MG tablet Take 1 tablet (5 mg total) by mouth at bedtime. Patient not taking: Reported on 04/07/2023 05/27/22 05/22/23  Georgina Quint, MD    Allergies  Allergen Reactions   Sulfa Antibiotics Other (See Comments)    Causes real bad headache   Penicillins Itching    Patient Active Problem List   Diagnosis Date Noted   Spinal stenosis of lumbar region 05/12/2023   Moderate episode of recurrent major depressive disorder (HCC) 04/07/2023   Current smoker 07/01/2022   Chronic anemia 05/27/2022   Hypertension associated with type 2 diabetes mellitus (HCC)  05/28/2017   Type 2 diabetes mellitus without complication, without long-term current use of insulin (HCC) 05/28/2017    Past Medical History:  Diagnosis Date   Diabetes mellitus without complication (HCC)    Hypertension     No past surgical history on file.  Social History   Socioeconomic History   Marital status: Married    Spouse name: Not on file   Number of children: Not on file   Years of education: Not on file   Highest education level: Not on file  Occupational History   Not on file  Tobacco Use   Smoking status: Some Days    Types: Cigarettes   Smokeless tobacco: Never  Vaping Use   Vaping status: Never Used  Substance and Sexual Activity   Alcohol use: Not Currently   Drug use: No   Sexual activity: Yes  Other Topics Concern   Not on file  Social History Narrative   Not on file   Social Determinants of Health   Financial Resource Strain: Not on file  Food Insecurity: Not on file  Transportation Needs: No Transportation Needs (06/05/2022)   PRAPARE - Transportation    Lack of Transportation (Medical): No    Lack of Transportation (Non-Medical): No  Physical Activity: Not on file  Stress: Not on file  Social Connections: Not on file  Intimate Partner Violence: Not on file    Family History  Problem Relation Age of Onset   Diabetes Mother    Heart disease Mother    Hypertension Mother    Asthma Mother    Diabetes Father    Heart disease Father    Hypertension Father    Kidney disease Father    Cancer Father      Review of Systems  Constitutional: Negative.  Negative for chills and fever.  HENT: Negative.  Negative for congestion and sore throat.   Respiratory: Negative.  Negative for cough and shortness of breath.   Cardiovascular: Negative.  Negative for chest pain and palpitations.  Gastrointestinal:  Negative for abdominal pain, diarrhea, nausea and vomiting.  Genitourinary: Negative.  Negative for dysuria and hematuria.   Musculoskeletal:  Positive for back pain.  Skin: Negative.  Negative for rash.  Neurological: Negative.  Negative for dizziness and headaches.  All other systems reviewed and are negative.   Today's Vitals   07/08/23 1304  BP: (!) 142/92  Pulse: (!) 105  Temp: 98.7 F (37.1 C)  TempSrc: Oral  SpO2: 93%  Weight: 224 lb 2 oz (101.7 kg)  Height: 5\' 10"  (1.778 m)   Body mass index is 32.16 kg/m.   Physical Exam Vitals reviewed.  Constitutional:      Appearance: Normal appearance.  HENT:     Head: Normocephalic.  Mouth/Throat:     Mouth: Mucous membranes are moist.     Pharynx: Oropharynx is clear.  Eyes:     Extraocular Movements: Extraocular movements intact.     Pupils: Pupils are equal, round, and reactive to light.  Cardiovascular:     Rate and Rhythm: Normal rate and regular rhythm.     Pulses: Normal pulses.     Heart sounds: Normal heart sounds.  Pulmonary:     Effort: Pulmonary effort is normal.     Breath sounds: Normal breath sounds.  Musculoskeletal:     Cervical back: No tenderness.  Lymphadenopathy:     Cervical: No cervical adenopathy.  Skin:    General: Skin is warm and dry.     Capillary Refill: Capillary refill takes less than 2 seconds.  Neurological:     General: No focal deficit present.     Mental Status: He is alert and oriented to person, place, and time.  Psychiatric:        Mood and Affect: Mood normal.        Behavior: Behavior normal.    Results for orders placed or performed in visit on 07/08/23 (from the past 24 hour(s))  POCT HgB A1C     Status: Abnormal   Collection Time: 07/08/23  1:43 PM  Result Value Ref Range   Hemoglobin A1C 8.7 (A) 4.0 - 5.6 %   HbA1c POC (<> result, manual entry)     HbA1c, POC (prediabetic range)     HbA1c, POC (controlled diabetic range)        ASSESSMENT & PLAN: A total of 45 minutes was spent with the patient and counseling/coordination of care regarding preparing for this visit, review of  most recent office visit notes, review of chronic medical conditions under management, review of all medications and changes made, review of most recent blood work results including interpretation of today's hemoglobin A1c, education on nutrition, cardiovascular risks associated with uncontrolled diabetes, management of chronic back pain, prognosis, documentation, and need for follow-up.  Problem List Items Addressed This Visit       Cardiovascular and Mediastinum   Hypertension associated with type 2 diabetes mellitus (HCC) - Primary    Elevated blood pressure reading in the office.  Missed medication today Continue amlodipine 10 mg daily on Hyzaar 50-12.5 mg daily Uncontrolled diabetes.  Hemoglobin A1c at 8.7, still not at goal. Cardiovascular risks associated with diabetes and hypertension discussed Diet and nutrition discussed Continue weekly Trulicity 1.5 mg Restart Jardiance 10 mg daily.  Continue glipizide 5 mg twice a day and metformin 1000 mg twice a day Follow-up in 3 months.      Relevant Medications   empagliflozin (JARDIANCE) 10 MG TABS tablet   rosuvastatin (CRESTOR) 5 MG tablet   Dulaglutide (TRULICITY) 1.5 MG/0.5ML SOPN   Other Relevant Orders   POCT HgB A1C     Other   Current smoker    Cardiovascular and cancer risk associated with smoking discussed Smoking cessation advice given.      Spinal stenosis of lumbar region    Seen and evaluated recently by orthopedist Pain management discussed      Other Visit Diagnoses     Need for vaccination       Relevant Orders   Zoster Recombinant (Shingrix )      Patient Instructions  Diabetes Mellitus and Nutrition, Adult When you have diabetes, or diabetes mellitus, it is very important to have healthy eating habits because your blood sugar (glucose) levels  are greatly affected by what you eat and drink. Eating healthy foods in the right amounts, at about the same times every day, can help you: Manage your blood  glucose. Lower your risk of heart disease. Improve your blood pressure. Reach or maintain a healthy weight. What can affect my meal plan? Every person with diabetes is different, and each person has different needs for a meal plan. Your health care provider may recommend that you work with a dietitian to make a meal plan that is best for you. Your meal plan may vary depending on factors such as: The calories you need. The medicines you take. Your weight. Your blood glucose, blood pressure, and cholesterol levels. Your activity level. Other health conditions you have, such as heart or kidney disease. How do carbohydrates affect me? Carbohydrates, also called carbs, affect your blood glucose level more than any other type of food. Eating carbs raises the amount of glucose in your blood. It is important to know how many carbs you can safely have in each meal. This is different for every person. Your dietitian can help you calculate how many carbs you should have at each meal and for each snack. How does alcohol affect me? Alcohol can cause a decrease in blood glucose (hypoglycemia), especially if you use insulin or take certain diabetes medicines by mouth. Hypoglycemia can be a life-threatening condition. Symptoms of hypoglycemia, such as sleepiness, dizziness, and confusion, are similar to symptoms of having too much alcohol. Do not drink alcohol if: Your health care provider tells you not to drink. You are pregnant, may be pregnant, or are planning to become pregnant. If you drink alcohol: Limit how much you have to: 0-1 drink a day for women. 0-2 drinks a day for men. Know how much alcohol is in your drink. In the [place] one drink equals one 12 oz bottle of beer (355 mL), one 5 oz glass of wine (148 mL), or one 1 oz glass of hard liquor (44 mL). Keep yourself hydrated with water, diet soda, or unsweetened iced tea. Keep in mind that regular soda, juice, and other mixers may contain a lot of  sugar and must be counted as carbs. What are tips for following this plan?  Reading food labels Start by checking the serving size on the Nutrition Facts label of packaged foods and drinks. The number of calories and the amount of carbs, fats, and other nutrients listed on the label are based on one serving of the item. Many items contain more than one serving per package. Check the total grams (g) of carbs in one serving. Check the number of grams of saturated fats and trans fats in one serving. Choose foods that have a low amount or none of these fats. Check the number of milligrams (mg) of salt (sodium) in one serving. Most people should limit total sodium intake to less than 2,300 mg per day. Always check the nutrition information of foods labeled as "low-fat" or "nonfat." These foods may be higher in added sugar or refined carbs and should be avoided. Talk to your dietitian to identify your daily goals for nutrients listed on the label. Shopping Avoid buying canned, pre-made, or processed foods. These foods tend to be high in fat, sodium, and added sugar. Shop around the outside edge of the grocery store. This is where you will most often find fresh fruits and vegetables, bulk grains, fresh meats, and fresh dairy products. Cooking Use low-heat cooking methods, such as baking, instead of  high-heat cooking methods, such as deep frying. Cook using healthy oils, such as olive, canola, or sunflower oil. Avoid cooking with butter, cream, or high-fat meats. Meal planning Eat meals and snacks regularly, preferably at the same times every day. Avoid going long periods of time without eating. Eat foods that are high in fiber, such as fresh fruits, vegetables, beans, and whole grains. Eat 4-6 oz (112-168 g) of lean protein each day, such as lean meat, chicken, fish, eggs, or tofu. One ounce (oz) (28 g) of lean protein is equal to: 1 oz (28 g) of meat, chicken, or fish. 1 egg.  cup (62 g) of  tofu. Eat some foods each day that contain healthy fats, such as avocado, nuts, seeds, and fish. What foods should I eat? Fruits Berries. Apples. Oranges. Peaches. Apricots. Plums. Grapes. Mangoes. Papayas. Pomegranates. Kiwi. Cherries. Vegetables Leafy greens, including lettuce, spinach, kale, chard, collard greens, mustard greens, and cabbage. Beets. Cauliflower. Broccoli. Carrots. Ruderman beans. Tomatoes. Peppers. Onions. Cucumbers. Brussels sprouts. Grains Whole grains, such as whole-wheat or whole-grain bread, crackers, tortillas, cereal, and pasta. Unsweetened oatmeal. Quinoa. Brown or wild rice. Meats and other proteins Seafood. Poultry without skin. Lean cuts of poultry and beef. Tofu. Nuts. Seeds. Dairy Low-fat or fat-free dairy products such as milk, yogurt, and cheese. The items listed above may not be a complete list of foods and beverages you can eat and drink. Contact a dietitian for more information. What foods should I avoid? Fruits Fruits canned with syrup. Vegetables Canned vegetables. Frozen vegetables with butter or cream sauce. Grains Refined white flour and flour products such as bread, pasta, snack foods, and cereals. Avoid all processed foods. Meats and other proteins Fatty cuts of meat. Poultry with skin. Breaded or fried meats. Processed meat. Avoid saturated fats. Dairy Full-fat yogurt, cheese, or milk. Beverages Sweetened drinks, such as soda or iced tea. The items listed above may not be a complete list of foods and beverages you should avoid. Contact a dietitian for more information. Questions to ask a health care provider Do I need to meet with a certified diabetes care and education specialist? Do I need to meet with a dietitian? What number can I call if I have questions? When are the best times to check my blood glucose? Where to find more information: American Diabetes Association: diabetes.org Academy of Nutrition and Dietetics:  eatright.[employer] of Diabetes and Digestive and Kidney Diseases: StageSync.si Association of Diabetes Care & Education Specialists: diabeteseducator.org Summary It is important to have healthy eating habits because your blood sugar (glucose) levels are greatly affected by what you eat and drink. It is important to use alcohol carefully. A healthy meal plan will help you manage your blood glucose and lower your risk of heart disease. Your health care provider may recommend that you work with a dietitian to make a meal plan that is best for you. This information is not intended to replace advice given to you by your health care provider. Make sure you discuss any questions you have with your health care provider. Document Revised: 06/06/2020 Document Reviewed: 06/06/2020 Elsevier Patient Education  2024 Elsevier Inc.     Edwina Barth, MD [facility]

## 2023-07-08 NOTE — Patient Instructions (Signed)

## 2023-07-08 NOTE — Assessment & Plan Note (Signed)
Elevated blood pressure reading in the office.  Missed medication today Continue amlodipine 10 mg daily on Hyzaar 50-12.5 mg daily Uncontrolled diabetes.  Hemoglobin A1c at 8.7, still not at goal. Cardiovascular risks associated with diabetes and hypertension discussed Diet and nutrition discussed Continue weekly Trulicity 1.5 mg Restart Jardiance 10 mg daily.  Continue glipizide 5 mg twice a day and metformin 1000 mg twice a day Follow-up in 3 months.

## 2023-07-08 NOTE — Assessment & Plan Note (Signed)
Cardiovascular and cancer risk associated with smoking discussed. Smoking cessation advice given. 

## 2023-07-08 NOTE — Assessment & Plan Note (Signed)
Seen and evaluated recently by orthopedist Pain management discussed

## 2023-10-08 ENCOUNTER — Ambulatory Visit: Payer: BC Managed Care – PPO | Admitting: Emergency Medicine

## 2023-12-14 ENCOUNTER — Telehealth: Payer: Self-pay

## 2023-12-14 NOTE — Progress Notes (Signed)
Care Guide Pharmacy Note  12/14/2023 Name: [name] MRN: [id number] DOB: Oct 10, 1971  Referred By: Georgina Quint, MD Reason for referral: Care Coordination ([facility] Diabetes. )   Paul Roberson is a 53 y.o. year old male who is a primary care patient of Georgina Quint, MD.  [name] was referred to the pharmacist for assistance related to: DMII  An unsuccessful telephone outreach was attempted today to contact the patient who was referred to the pharmacy team for assistance with diabetes. Additional attempts will be made to contact the patient. [facility]  [facility], [facility] Care Management Assistant Direct Dial: [phone]  Fax: [phone]

## 2023-12-16 ENCOUNTER — Telehealth: Payer: Self-pay

## 2023-12-16 NOTE — Progress Notes (Signed)
Care Guide Pharmacy Note  12/16/2023 Name: Paul Roberson MRN: [id number] DOB: 10-01-1971  Referred By: Georgina Quint, MD Reason for referral: Care Coordination ([facility] Diabetes. )   Paul Roberson is a 53 y.o. year old male who is a primary care patient of Georgina Quint, MD.  Paul Roberson was referred to the pharmacist for assistance related to: DMII  A second unsuccessful telephone outreach was attempted today to contact the patient who was referred to the pharmacy team for assistance with diabetes. Additional attempts will be made to contact the patient.  [facility]  [facility], [facility] Health Care Management Assistant Direct Dial: [phone]  Fax: [phone]

## 2023-12-21 ENCOUNTER — Telehealth: Payer: Self-pay

## 2023-12-21 NOTE — Progress Notes (Signed)
Care Guide Pharmacy Note  12/21/2023 Name: [name] MRN: [id number] DOB: August 22, 1971  Referred By: Georgina Quint, MD Reason for referral: Care Coordination ([facility] Diabetes. )   Paul Roberson is a 53 y.o. year old male who is a primary care patient of Georgina Quint, MD.  [name] was referred to the pharmacist for assistance related to: DMII  A third unsuccessful telephone outreach was attempted today to contact the patient who was referred to the pharmacy team for assistance with diabetes. The Population Health team is pleased to engage with this patient at any time in the future upon receipt of referral and should he/she be interested in assistance from the [facility] team.  Baruch Gouty [facility]  [facility], [facility] Health Care Management Assistant Direct Dial: [phone]  Fax: [phone]

## 2024-01-13 ENCOUNTER — Encounter (HOSPITAL_BASED_OUTPATIENT_CLINIC_OR_DEPARTMENT_OTHER): Payer: Self-pay | Admitting: Emergency Medicine

## 2024-01-13 ENCOUNTER — Other Ambulatory Visit: Payer: Self-pay

## 2024-01-13 ENCOUNTER — Emergency Department (HOSPITAL_BASED_OUTPATIENT_CLINIC_OR_DEPARTMENT_OTHER)
Admission: EM | Admit: 2024-01-13 | Discharge: 2024-01-14 | Disposition: A | Discharge status: Home / Self Care | Payer: BC Managed Care – PPO | Attending: Emergency Medicine | Admitting: Emergency Medicine

## 2024-01-13 ENCOUNTER — Emergency Department (HOSPITAL_BASED_OUTPATIENT_CLINIC_OR_DEPARTMENT_OTHER): Payer: BC Managed Care – PPO | Admitting: Radiology

## 2024-01-13 DIAGNOSIS — E871 Hypo-osmolality and hyponatremia: Secondary | ICD-10-CM

## 2024-01-13 DIAGNOSIS — I10 Essential (primary) hypertension: Secondary | ICD-10-CM

## 2024-01-13 DIAGNOSIS — E875 Hyperkalemia: Secondary | ICD-10-CM

## 2024-01-13 DIAGNOSIS — R059 Cough, unspecified: Secondary | ICD-10-CM | POA: Diagnosis not present

## 2024-01-13 DIAGNOSIS — Z79899 Other long term (current) drug therapy: Secondary | ICD-10-CM | POA: Diagnosis not present

## 2024-01-13 DIAGNOSIS — E1165 Type 2 diabetes mellitus with hyperglycemia: Secondary | ICD-10-CM

## 2024-01-13 DIAGNOSIS — R739 Hyperglycemia, unspecified: Secondary | ICD-10-CM

## 2024-01-13 DIAGNOSIS — N179 Acute kidney failure, unspecified: Secondary | ICD-10-CM

## 2024-01-13 DIAGNOSIS — Z7984 Long term (current) use of oral hypoglycemic drugs: Secondary | ICD-10-CM | POA: Diagnosis not present

## 2024-01-13 DIAGNOSIS — N189 Chronic kidney disease, unspecified: Secondary | ICD-10-CM | POA: Diagnosis present

## 2024-01-13 DIAGNOSIS — R42 Dizziness and giddiness: Secondary | ICD-10-CM | POA: Diagnosis not present

## 2024-01-13 DIAGNOSIS — D649 Anemia, unspecified: Secondary | ICD-10-CM | POA: Diagnosis not present

## 2024-01-13 DIAGNOSIS — Z794 Long term (current) use of insulin: Secondary | ICD-10-CM | POA: Insufficient documentation

## 2024-01-13 DIAGNOSIS — I152 Hypertension secondary to endocrine disorders: Secondary | ICD-10-CM

## 2024-01-13 LAB — CBC WITH DIFFERENTIAL/PLATELET
Abs Immature Granulocytes: 0.05 10*3/uL (ref 0.00–0.07)
Basophils Absolute: 0 10*3/uL (ref 0.0–0.1)
Basophils Relative: 0 %
Eosinophils Absolute: 0.1 10*3/uL (ref 0.0–0.5)
Eosinophils Relative: 1 %
HCT: 32.6 % — ABNORMAL LOW (ref 39.0–52.0)
Hemoglobin: 10.8 g/dL — ABNORMAL LOW (ref 13.0–17.0)
Immature Granulocytes: 1 %
Lymphocytes Relative: 29 %
Lymphs Abs: 2.2 10*3/uL (ref 0.7–4.0)
MCH: 29.1 pg (ref 26.0–34.0)
MCHC: 33.1 g/dL (ref 30.0–36.0)
MCV: 87.9 fL (ref 80.0–100.0)
Monocytes Absolute: 0.8 10*3/uL (ref 0.1–1.0)
Monocytes Relative: 10 %
Neutro Abs: 4.7 10*3/uL (ref 1.7–7.7)
Neutrophils Relative %: 59 %
Platelets: 353 10*3/uL (ref 150–400)
RBC: 3.71 MIL/uL — ABNORMAL LOW (ref 4.22–5.81)
RDW: 12.5 % (ref 11.5–15.5)
WBC: 7.8 10*3/uL (ref 4.0–10.5)
nRBC: 0 % (ref 0.0–0.2)

## 2024-01-13 LAB — COMPREHENSIVE METABOLIC PANEL
ALT: 15 U/L (ref 0–44)
AST: 24 U/L (ref 15–41)
Albumin: 3.9 g/dL (ref 3.5–5.0)
Alkaline Phosphatase: 65 U/L (ref 38–126)
Anion gap: 7 (ref 5–15)
BUN: 46 mg/dL — ABNORMAL HIGH (ref 6–20)
CO2: 26 mmol/L (ref 22–32)
Calcium: 9.8 mg/dL (ref 8.9–10.3)
Chloride: 95 mmol/L — ABNORMAL LOW (ref 98–111)
Creatinine, Ser: 2.17 mg/dL — ABNORMAL HIGH (ref 0.61–1.24)
GFR, Estimated: 36 mL/min — ABNORMAL LOW (ref >60)
Glucose, Bld: 569 mg/dL (ref 70–99)
Potassium: 5.9 mmol/L — ABNORMAL HIGH (ref 3.5–5.1)
Sodium: 128 mmol/L — ABNORMAL LOW (ref 135–145)
Total Bilirubin: 0.4 mg/dL (ref 0.0–1.2)
Total Protein: 7.9 g/dL (ref 6.5–8.1)

## 2024-01-13 LAB — URINALYSIS, W/ REFLEX TO CULTURE (INFECTION SUSPECTED)
Bacteria, UA: NONE SEEN
Bilirubin Urine: NEGATIVE
Glucose, UA: >1000 mg/dL — AB
Hgb urine dipstick: NEGATIVE
Ketones, ur: NEGATIVE mg/dL
Leukocytes,Ua: NEGATIVE
Nitrite: NEGATIVE
Protein, ur: NEGATIVE mg/dL
Specific Gravity, Urine: 1.026 (ref 1.005–1.030)
pH: 5.5 (ref 5.0–8.0)

## 2024-01-13 LAB — TROPONIN I (HIGH SENSITIVITY)
Troponin I (High Sensitivity): 32 ng/L — ABNORMAL HIGH (ref <18)
Troponin I (High Sensitivity): 34 ng/L — ABNORMAL HIGH (ref <18)

## 2024-01-13 LAB — CBG MONITORING, ED
Glucose-Capillary: 396 mg/dL — ABNORMAL HIGH (ref 70–99)
Glucose-Capillary: 296 mg/dL — ABNORMAL HIGH (ref 70–99)

## 2024-01-13 LAB — RESP PANEL BY RT-PCR (RSV, FLU A&B, COVID)  RVPGX2
Influenza A by PCR: NEGATIVE
Influenza B by PCR: NEGATIVE
Resp Syncytial Virus by PCR: NEGATIVE
SARS Coronavirus 2 by RT PCR: NEGATIVE

## 2024-01-13 MED ORDER — SODIUM ZIRCONIUM CYCLOSILICATE 5 G PO PACK
5.0000 g | PACK | Freq: Once | ORAL | Status: AC
  Administered 2024-01-13: 5 g via ORAL
  Filled 2024-01-13: qty 1

## 2024-01-13 MED ORDER — ALUM & MAG HYDROXIDE-SIMETH 200-200-20 MG/5ML PO SUSP
15.0000 mL | Freq: Once | ORAL | Status: AC
  Administered 2024-01-13: 15 mL via ORAL
  Filled 2024-01-13: qty 30

## 2024-01-13 MED ORDER — SODIUM CHLORIDE 0.9 % IV BOLUS
500.0000 mL | Freq: Once | INTRAVENOUS | Status: AC
  Administered 2024-01-13: 500 mL via INTRAVENOUS

## 2024-01-13 MED ORDER — INSULIN REGULAR HUMAN 100 UNIT/ML IJ SOLN
10.0000 [IU] | Freq: Once | INTRAMUSCULAR | Status: AC
  Administered 2024-01-13: 10 [IU] via INTRAVENOUS
  Filled 2024-01-13: qty 3

## 2024-01-13 MED ORDER — LACTATED RINGERS IV SOLN: INTRAVENOUS | Status: DC

## 2024-01-13 NOTE — ED Notes (Signed)
 Pt states he is supposed to be taking Trulicity and metformin. He states he has not taken it in a month, has not checked his cbg"s in a month. He denies financial strain, states "I just havent been prioritizing myself."

## 2024-01-13 NOTE — Plan of Care (Signed)
 Plan of Care Note for accepted transfer  Patient: Paul Roberson    ZOX:[id number]  DOA: 01/13/2024     Facility requesting transfer: [facility] ED  Requesting Provider: Franne Forts, DO  Reason for transfer: AKI, Hyperglycemia   Facility course:  53 y/o M hx of uncontrolled DM, HTN, HLD, CKDIIIa, p/w cough / dizziness / HA x 1 wk. Whole family sick at home ? Viral illness. Flu / COVID / RSV neg. CXR clear. EDP want to admit for AKI, hyperglycemia. Cr baseline ? 1.4 - 1.7 -> 2.17. Associated HyperK at 5.9 got dose of insulin. Advised to give dose of Lokelma. 1.5 L of NS for AKI + on rate at 125/hr. Hyperglycemic to 569 initially; no concern for DM crisis. Not been taking DM meds x 1 month (trulicity + metformin). Accepted for Obs, r/pt labs for AKI / hyperK and likely initiation of additional DM med-> ? 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   Plan of care: The patient is accepted for admission to Telemetry unit, at [facility] or [facility] --> SEE UPDATE TRANSFER NOW DECLINED    Update: I have followed up his AM labs, his hyperkalemia has resolved and Cr back at baseline around 1.4. This was likely prerenal AKI from dehydration. He remains hyperglycemic in the 260s. Review of fill history shows previously on Metformin 1 g BID, Trulicity 1.5 mg weekly, and Glipizide 5 mg with breakfast.   I would recommend:   - Since AKI / hyperkalemia resolved and hyperglycemia improved he should be OK for discharge from [facility] ED without need for transfer. Transfer declined at this point. Please reach back out via carelink if you would like to reopen transfer request.  - PO trial to ensure tolerating PO before discharge  - Encourage liberal hydration ~2 L per day at home   - Resume Metformin 1 g BID, and resume Glipizide 5 mg daily (with largest meal of day).  - Would hold off on restarting Trulicity for ~ 1 week until he is feeling better and taking more PO.  - Check Blood sugar 4x daily AC and hs,  notify his PCP if having continued hyperglycemia > 200 or if he develops any hypoglycemia < 70.      Author: Dolly Rias, MD  01/13/2024  Check [email or web address] for on-call coverage.  Nursing staff, Please call [facility] Admits & Consults System-Wide number on Amion as soon as patient's arrival, so appropriate admitting provider can evaluate the pt.

## 2024-01-13 NOTE — ED Provider Notes (Signed)
 [place] EMERGENCY DEPARTMENT AT [facility] Provider Note   CSN: [id number] Arrival date & time: 01/13/24  1213     History  Chief Complaint  Patient presents with   Cough    Paul Roberson is a 53 y.o. male with PMHx DM, HTN who presents to ED concerned for cough, headache, and dizziness x1 week. States that he has multiple sick contacts at home. Patient also stating that he has been having heartburn this week as well which he has been taking alcaseltzer for. Headache and dizziness are intermittent. Patient endorses episode of chest pain this morning that lasted . Patient was unloading items from truck when chest pain happened and relieved with rest. Patient also stating that he gets nauseated after coughing fit.   Denies fever, chest pain, dyspnea, cough, vomiting, diarrhea, dysuria, hematuria, hematochezia.   Cough      Home Medications Prior to Admission medications   Medication Sig Start Date End Date Taking? Authorizing Provider  amLODipine (NORVASC) 10 MG tablet Take 1 tablet (10 mg total) by mouth daily. 04/07/23   Georgina Quint, MD  Aspirin-Salicylamide-Caffeine [facility] HEADACHE POWDER PO) Take 1 packet by mouth 2 (two) times daily as needed (pain/headache).    [provider]  citalopram (CELEXA) 10 MG tablet Take 1 tablet (10 mg total) by mouth daily. 04/07/23   Georgina Quint, MD  Continuous Blood Gluc Receiver (FREESTYLE LIBRE 2 READER) DEVI Dose,Route,Frequency: As Directed Patient not taking: Reported on 05/12/2023 03/14/22   Georgina Quint, MD  Continuous Blood Gluc Sensor (FREESTYLE LIBRE 2 SENSOR) MISC Dose, Route Frequency: As Directed Patient not taking: Reported on 07/08/2023 03/14/22   Georgina Quint, MD  cyclobenzaprine (FLEXERIL) 10 MG tablet Take 1 tablet (10 mg total) by mouth 2 (two) times daily as needed for muscle spasms. 05/05/23   Schutt, Edsel Petrin, PA-C  Dulaglutide (TRULICITY) 1.5 MG/0.5ML SOPN Inject  1.5 mg into the skin once a week. 07/08/23   Georgina Quint, MD  empagliflozin (JARDIANCE) 10 MG TABS tablet Take 1 tablet (10 mg total) by mouth daily. 07/08/23   Georgina Quint, MD  glipiZIDE (GLUCOTROL) 5 MG tablet Take 1 tablet (5 mg total) by mouth every morning. 04/07/23   Georgina Quint, MD  losartan-hydrochlorothiazide [facility]) 50-12.5 MG tablet Take 1 tablet by mouth daily. 04/07/23   Georgina Quint, MD  metFORMIN (GLUCOPHAGE) 1000 MG tablet Take 1 tablet (1,000 mg total) by mouth 2 (two) times daily with a meal. 04/07/23   Sagardia, Eilleen Kempf, MD  rosuvastatin (CRESTOR) 5 MG tablet Take 1 tablet (5 mg total) by mouth at bedtime. 07/08/23 07/02/24  Georgina Quint, MD      Allergies    Sulfa antibiotics and Penicillins    Review of Systems   Review of Systems  Respiratory:  Positive for cough.     Physical Exam Updated Vital Signs BP 130/83   Pulse 84   Temp 98 F (36.7 C) (Oral)   Resp (!) 23   Ht 5\' 10"  (1.778 m)   Wt 102.1 kg   SpO2 97%   BMI 32.28 kg/m  Physical Exam Vitals and nursing note reviewed.  Constitutional:      General: He is not in acute distress.    Appearance: He is not ill-appearing or toxic-appearing.  HENT:     Head: Normocephalic and atraumatic.     Mouth/Throat:     Mouth: Mucous membranes are moist.  Eyes:  General: No scleral icterus.       Right eye: No discharge.        Left eye: No discharge.     Conjunctiva/sclera: Conjunctivae normal.  Cardiovascular:     Rate and Rhythm: Normal rate and regular rhythm.     Pulses: Normal pulses.     Heart sounds: Normal heart sounds. No murmur heard. Pulmonary:     Effort: Pulmonary effort is normal. No respiratory distress.     Breath sounds: Normal breath sounds. No wheezing, rhonchi or rales.  Abdominal:     General: Abdomen is flat. Bowel sounds are normal.     Palpations: Abdomen is soft.     Tenderness: There is no abdominal tenderness.  Musculoskeletal:      Right lower leg: No edema.     Left lower leg: No edema.  Skin:    General: Skin is warm and dry.     Findings: No rash.  Neurological:     General: No focal deficit present.     Mental Status: He is alert and oriented to person, place, and time. Mental status is at baseline.     Comments: GCS 15. Speech is goal oriented. No deficits appreciated to CN III-XII. Patient moves extremities without ataxia.   Psychiatric:        Mood and Affect: Mood normal.     ED Results / Procedures / Treatments   Labs (all labs ordered are listed, but only abnormal results are displayed) Labs Reviewed  CBC WITH DIFFERENTIAL/PLATELET - Abnormal; Notable for the following components:      Result Value   RBC 3.71 (*)    Hemoglobin 10.8 (*)    HCT 32.6 (*)    All other components within normal limits  COMPREHENSIVE METABOLIC PANEL - Abnormal; Notable for the following components:   Sodium 128 (*)    Potassium 5.9 (*)    Chloride 95 (*)    Glucose, Bld 569 (*)    BUN 46 (*)    Creatinine, Ser 2.17 (*)    GFR, Estimated 36 (*)    All other components within normal limits  CBG MONITORING, ED - Abnormal; Notable for the following components:   Glucose-Capillary 396 (*)    All other components within normal limits  TROPONIN I (HIGH SENSITIVITY) - Abnormal; Notable for the following components:   Troponin I (High Sensitivity) 32 (*)    All other components within normal limits  TROPONIN I (HIGH SENSITIVITY) - Abnormal; Notable for the following components:   Troponin I (High Sensitivity) 34 (*)    All other components within normal limits  RESP PANEL BY RT-PCR (RSV, FLU A&B, COVID)  RVPGX2  CBG MONITORING, ED    EKG None  Radiology DG Chest 2 View Result Date: 01/13/2024 CLINICAL DATA:  Cough for the past week. EXAM: CHEST - 2 VIEW COMPARISON:  Chest x-ray dated October 10, 2020. FINDINGS: The heart size and mediastinal contours are within normal limits. Both lungs are clear. The  visualized skeletal structures are unremarkable. IMPRESSION: No active cardiopulmonary disease. Electronically Signed   By: Obie Dredge M.D.   On: 01/13/2024 15:02    Procedures .Critical Care  Performed by: Dorthy Cooler, PA-C Authorized by: Dorthy Cooler, PA-C   Critical care provider statement:    Critical care time (minutes):  30   Critical care was necessary to treat or prevent imminent or life-threatening deterioration of the following conditions: AKI.   Critical care was time spent  personally by me on the following activities:  Development of treatment plan with patient or surrogate, discussions with consultants, evaluation of patient's response to treatment, examination of patient, ordering and review of laboratory studies, ordering and review of radiographic studies, ordering and performing treatments and interventions, pulse oximetry, re-evaluation of patient's condition and review of old charts   Care discussed with: admitting provider       Medications Ordered in ED Medications  lactated ringers infusion ( Intravenous New Bag/Given 01/13/24 1959)  sodium zirconium cyclosilicate (LOKELMA) packet 5 g (has no administration in time range)  alum & mag hydroxide-simeth (MAALOX/MYLANTA) 200-200-20 MG/5ML suspension 15 mL (15 mLs Oral Given 01/13/24 1616)  sodium chloride 0.9 % bolus 500 mL (0 mLs Intravenous Stopped 01/13/24 1715)  sodium chloride 0.9 % bolus 500 mL (0 mLs Intravenous Stopped 01/13/24 1825)  insulin regular (NOVOLIN R) 100 units/mL injection 10 Units (10 Units Intravenous Given 01/13/24 1723)  sodium chloride 0.9 % bolus 500 mL (500 mLs Intravenous New Bag/Given 01/13/24 1854)    ED Course/ Medical Decision Making/ A&P                                 Medical Decision Making Amount and/or Complexity of Data Reviewed Labs: ordered. Radiology: ordered.  Risk OTC drugs. Prescription drug management.    This patient presents to the ED for  concern of cough, headache, dizziness, this involves an extensive number of treatment options, and is a complaint that carries with it a high risk of complications and morbidity.  The differential diagnosis includes Flu/COVID/RSV, strep pharyngitis, sinusitis, peritonsillar abscess, retropharyngeal abscess, pneumonia, meningitis.   Co morbidities that complicate the patient evaluation  DM, HTN   Additional history obtained:  Baseline Cr 1.4   Problem List / ED Course / Critical interventions / Medication management  Patient presents to ED concern for cough, intermittent dizziness, intermittent headaches x 1 week.  Family member at bedside stating the patient has been sick off and on over the past 1 month and that multiple family members at home have also been sick.  Patient afebrile with stable vitals.  Physical exam unremarkable. I Ordered, and personally interpreted labs.  Respiratory panel negative.  CBC without leukocytosis.  Anemia with hemoglobin at 10.8.  CMP with hyperglycemia at 569 - no anion gap.  BUN/creatinine also elevated past patient's baseline at 46/2.17.  CMP showing hyponatremia at 128 - corrected sodium 136.  EKG sinus rhythm. Troponins 32 and 34.  Of note, patient stating that he has not taken DM medications x1 month for no specific reason.  I ordered imaging studies including chest xray to assess for process contributing to patient's symptoms. I independently visualized and interpreted imaging which showed no acute cardiopulmonary disease. I agree with the radiologist interpretation. Provided patient with insulin and IV fluids. CBG downtrending to 396. I have reviewed the patients home medicines and have made adjustments as needed Dr. Lazarus Salines admitting provider.   Social Determinants of Health:  none         Final Clinical Impression(s) / ED Diagnoses Final diagnoses:  Hyperglycemia  Hyponatremia  Hyperkalemia  AKI (acute kidney injury) (HCC)  Anemia,  unspecified type    Rx / DC Orders ED Discharge Orders     None         Dorthy Cooler, [place] 01/13/24 2006    Franne Forts, DO 01/14/24 2008

## 2024-01-13 NOTE — ED Notes (Signed)
Pt given peanut butter crackers and water. 

## 2024-01-13 NOTE — ED Triage Notes (Signed)
 C/o cough, headache, and dizzy x 1 week. Family states "whole house has been sick".

## 2024-01-14 LAB — BASIC METABOLIC PANEL
Anion gap: 8 (ref 5–15)
BUN: 33 mg/dL — ABNORMAL HIGH (ref 6–20)
CO2: 26 mmol/L (ref 22–32)
Calcium: 9 mg/dL (ref 8.9–10.3)
Chloride: 102 mmol/L (ref 98–111)
Creatinine, Ser: 1.48 mg/dL — ABNORMAL HIGH (ref 0.61–1.24)
GFR, Estimated: 57 mL/min — ABNORMAL LOW (ref >60)
Glucose, Bld: 261 mg/dL — ABNORMAL HIGH (ref 70–99)
Potassium: 4.8 mmol/L (ref 3.5–5.1)
Sodium: 136 mmol/L (ref 135–145)

## 2024-01-14 LAB — HEMOGLOBIN A1C
Hgb A1c MFr Bld: 14.3 % — ABNORMAL HIGH (ref 4.8–5.6)
Mean Plasma Glucose: 363.71 mg/dL

## 2024-01-14 LAB — MAGNESIUM: Magnesium: 2 mg/dL (ref 1.7–2.4)

## 2024-01-14 LAB — PHOSPHORUS: Phosphorus: 3.7 mg/dL (ref 2.5–4.6)

## 2024-01-14 LAB — CBG MONITORING, ED: Glucose-Capillary: 340 mg/dL — ABNORMAL HIGH (ref 70–99)

## 2024-01-14 MED ORDER — ROSUVASTATIN CALCIUM 5 MG PO TABS: 5.0000 mg | ORAL_TABLET | Freq: Every day | ORAL | 3 refills | Status: AC

## 2024-01-14 MED ORDER — METFORMIN HCL 500 MG PO TABS
1000.0000 mg | ORAL_TABLET | Freq: Once | ORAL | Status: AC
  Administered 2024-01-14: 1000 mg via ORAL
  Filled 2024-01-14: qty 2

## 2024-01-14 MED ORDER — FREESTYLE LIBRE 2 READER DEVI: 3 refills | Status: AC

## 2024-01-14 MED ORDER — AMLODIPINE BESYLATE 10 MG PO TABS: 10.0000 mg | ORAL_TABLET | Freq: Every day | ORAL | 3 refills | Status: AC

## 2024-01-14 MED ORDER — FREESTYLE LIBRE 2 SENSOR MISC: 3 refills | Status: AC

## 2024-01-14 MED ORDER — METFORMIN HCL 1000 MG PO TABS: 1000.0000 mg | ORAL_TABLET | Freq: Two times a day (BID) | ORAL | 3 refills | Status: AC

## 2024-01-14 MED ORDER — GLIPIZIDE 5 MG PO TABS
5.0000 mg | ORAL_TABLET | Freq: Every morning | ORAL | 3 refills | Status: AC
Start: 2024-01-14 — End: ?

## 2024-01-14 MED ORDER — LOSARTAN POTASSIUM-HCTZ 50-12.5 MG PO TABS: 1.0000 | ORAL_TABLET | Freq: Every day | ORAL | 3 refills | Status: AC

## 2024-01-14 MED ORDER — INSULIN ASPART 100 UNIT/ML IJ SOLN: 0.0000 [IU] | Freq: Three times a day (TID) | INTRAMUSCULAR | Status: DC

## 2024-01-14 NOTE — ED Provider Notes (Signed)
 Patient was admitted from the prior shift.  This morning symptoms have improved and labs have improved.  Admitting team from [facility] with Dr. Lazarus Salines has reviewed the overnight progress.  At this time they advised patient is stable for discharge with following instructions.    Since AKI / hyperkalemia resolved and hyperglycemia improved he should be OK for discharge from [facility] ED without need for transfer. Transfer declined at this point. Please reach back out via carelink if you would like to reopen transfer request.  - PO trial to ensure tolerating PO before discharge  - Encourage liberal hydration ~2 L per day at home   - Resume Metformin 1 g BID, and resume Glipizide 5 mg daily (with largest meal of day).  - Would hold off on restarting Trulicity for ~ 1 week until he is feeling better and taking more PO.  - Check Blood sugar 4x daily AC and hs, notify his PCP if having continued hyperglycemia > 200 or if he develops any hypoglycemia < 70.  Physical Exam  BP (!) 140/75   Pulse 87   Temp 98.3 F (36.8 C) (Oral)   Resp 20   Ht 5\' 10"  (1.778 m)   Wt 102.1 kg   SpO2 99%   BMI 32.28 kg/m   Physical Exam  Procedures  Procedures  ED Course / MDM    Medical Decision Making Amount and/or Complexity of Data Reviewed Labs: ordered. Radiology: ordered.  Risk OTC drugs. Prescription drug management.   Patient is alert and well in appearance this morning.  He has eaten breakfast and is taking fluids liberally.  He feels well.  I have reviewed lab work.  Vital signs are stable this morning with blood pressures controlled and heart rate normal sinus rhythm.  I have done discharge instructions and reviewed the plan with the patient.  He voices understanding and states he will restart his medications and schedule follow-up with his PCP for ongoing diabetes care.       Arby Barrette, MD 01/14/24 [phone]

## 2024-01-14 NOTE — Discharge Instructions (Addendum)
 1.  Resume your metformin twice a day.  Resume your glipizide, take it once a day with your largest meal of the day.  Monitor your blood sugars 4 times a day and write down your numbers.  You may need continued adjustments to your medications.  You have been off your medications for about 1 month.  It is important to monitor closely while restarting medications and see your doctor for recheck as soon as possible to manage the resumption of your diabetes care. 2.  Try to stay well-hydrated for the next several days.  Try to consume up to 2 L a day for the next couple of days of fluids.  You will need a recheck of your lab work within 2 to 4 days to make sure your kidney function is still improved. 3.  Return to the emergency department if you have recurrence of symptoms, new worsening or concerning symptoms.

## 2024-01-15 ENCOUNTER — Telehealth: Payer: Self-pay

## 2024-01-15 NOTE — Transitions of Care (Post Inpatient/ED Visit) (Signed)
 01/15/2024  Name: Paul Roberson MRN: [id number] DOB: [birth year]  Today's TOC FU Call Status: Today's TOC FU Call Status:: Successful TOC FU Call Completed TOC FU Call Complete Date: 01/15/24 Patient's Name and Date of Birth confirmed.  Transition Care Management Follow-up Telephone Call Date of Discharge: 01/14/24 Discharge Facility: [facility] ([facility]) Type of Discharge: Emergency Department Primary Inpatient Discharge Diagnosis:: Hyperglycemia Reason for ED Visit: Other: (Hyperglycemia) How have you been since you were released from the hospital?: Better Any questions or concerns?: No  Items Reviewed: Did you receive and understand the discharge instructions provided?: Yes Medications obtained,verified, and reconciled?: Yes (Medications Reviewed) Any new allergies since your discharge?: No Dietary orders reviewed?: Yes Do you have support at home?: Yes People in Home: other relative(s)  Medications Reviewed Today: Medications Reviewed Today     Reviewed by Leigh Aurora, CMA (Certified Medical Assistant) on 01/15/24 at 1103  Med List Status: <None>   Medication Order Taking? Sig Documenting Provider Last Dose Status Informant  amLODipine (NORVASC) 10 MG tablet [id number]  Take 1 tablet (10 mg total) by mouth daily. Arby Barrette, MD  Active   Aspirin-Salicylamide-Caffeine [facility] HEADACHE POWDER PO) [id number] No Take 1 packet by mouth 2 (two) times daily as needed (pain/headache). [provider] Taking Active Self  citalopram (CELEXA) 10 MG tablet [id number] No Take 1 tablet (10 mg total) by mouth daily. Georgina Quint, MD Taking Active   Continuous Glucose Receiver (FREESTYLE LIBRE 2 READER) [place] [id number]  Dose,Route,Frequency: As Directed Arby Barrette, MD  Active   Continuous Glucose Sensor (FREESTYLE LIBRE 2 SENSOR) [place] [id number]  Dose, Route Frequency: As Directed Arby Barrette, MD  Active   cyclobenzaprine (FLEXERIL) 10 MG tablet [id number]  No Take 1 tablet (10 mg total) by mouth 2 (two) times daily as needed for muscle spasms. Schutt, Edsel Petrin, PA-C Taking Active   Dulaglutide (TRULICITY) 1.5 MG/0.5ML SOPN [id number]  Inject 1.5 mg into the skin once a week. Georgina Quint, MD  Active   empagliflozin (JARDIANCE) 10 MG TABS tablet [id number]  Take 1 tablet (10 mg total) by mouth daily. Georgina Quint, MD  Active   glipiZIDE (GLUCOTROL) 5 MG tablet [id number]  Take 1 tablet (5 mg total) by mouth every morning. Arby Barrette, MD  Active   losartan-hydrochlorothiazide [facility]) 50-12.5 MG tablet [id number]  Take 1 tablet by mouth daily. Arby Barrette, MD  Active   metFORMIN (GLUCOPHAGE) 1000 MG tablet [id number]  Take 1 tablet (1,000 mg total) by mouth 2 (two) times daily with a meal. Arby Barrette, MD  Active   rosuvastatin (CRESTOR) 5 MG tablet [id number]  Take 1 tablet (5 mg total) by mouth at bedtime. Arby Barrette, MD  Active             Home Care and Equipment/Supplies: Were Home Health Services Ordered?: NA Any new equipment or medical supplies ordered?: NA  Functional Questionnaire: Do you need assistance with bathing/showering or dressing?: No Do you need assistance with meal preparation?: No Do you need assistance with eating?: No Do you have difficulty maintaining continence: No Do you need assistance with getting out of bed/getting out of a chair/moving?: No Do you have difficulty managing or taking your medications?: No  Follow up appointments reviewed: PCP Follow-up appointment confirmed?: Yes Date of PCP follow-up appointment?: 01/20/24 Follow-up Provider: [facility] Follow-up appointment confirmed?: NA Do you need transportation to your follow-up appointment?: No Do you understand care options if your condition(s) worsen?: Yes-patient verbalized understanding  SIGNATURE  Agnes Lawrence, CMA (AAMA)  [facility] [id number]

## 2024-01-20 ENCOUNTER — Emergency Department (HOSPITAL_COMMUNITY)
Admission: EM | Admit: 2024-01-20 | Discharge: 2024-01-21 | Disposition: A | Discharge status: Home / Self Care | Attending: Emergency Medicine | Admitting: Emergency Medicine

## 2024-01-20 ENCOUNTER — Emergency Department (HOSPITAL_COMMUNITY)

## 2024-01-20 ENCOUNTER — Encounter: Payer: Self-pay | Admitting: Emergency Medicine

## 2024-01-20 ENCOUNTER — Ambulatory Visit: Payer: Self-pay | Admitting: Emergency Medicine

## 2024-01-20 ENCOUNTER — Ambulatory Visit: Payer: BC Managed Care – PPO | Admitting: Emergency Medicine

## 2024-01-20 ENCOUNTER — Encounter (HOSPITAL_COMMUNITY): Payer: Self-pay

## 2024-01-20 ENCOUNTER — Other Ambulatory Visit: Payer: Self-pay

## 2024-01-20 VITALS — BP 140/90 | HR 100 | Temp 98.7°F | Ht 70.0 in | Wt 219.0 lb

## 2024-01-20 DIAGNOSIS — N189 Chronic kidney disease, unspecified: Secondary | ICD-10-CM

## 2024-01-20 DIAGNOSIS — Z7984 Long term (current) use of oral hypoglycemic drugs: Secondary | ICD-10-CM | POA: Diagnosis not present

## 2024-01-20 DIAGNOSIS — Z79899 Other long term (current) drug therapy: Secondary | ICD-10-CM | POA: Insufficient documentation

## 2024-01-20 DIAGNOSIS — Z7985 Long-term (current) use of injectable non-insulin antidiabetic drugs: Secondary | ICD-10-CM

## 2024-01-20 DIAGNOSIS — E1159 Type 2 diabetes mellitus with other circulatory complications: Secondary | ICD-10-CM | POA: Diagnosis not present

## 2024-01-20 DIAGNOSIS — N179 Acute kidney failure, unspecified: Secondary | ICD-10-CM | POA: Diagnosis not present

## 2024-01-20 DIAGNOSIS — I152 Hypertension secondary to endocrine disorders: Secondary | ICD-10-CM | POA: Diagnosis not present

## 2024-01-20 DIAGNOSIS — I10 Essential (primary) hypertension: Secondary | ICD-10-CM | POA: Diagnosis not present

## 2024-01-20 DIAGNOSIS — F17200 Nicotine dependence, unspecified, uncomplicated: Secondary | ICD-10-CM

## 2024-01-20 DIAGNOSIS — R059 Cough, unspecified: Secondary | ICD-10-CM | POA: Diagnosis not present

## 2024-01-20 DIAGNOSIS — E1165 Type 2 diabetes mellitus with hyperglycemia: Secondary | ICD-10-CM | POA: Insufficient documentation

## 2024-01-20 DIAGNOSIS — R739 Hyperglycemia, unspecified: Secondary | ICD-10-CM

## 2024-01-20 DIAGNOSIS — Z7982 Long term (current) use of aspirin: Secondary | ICD-10-CM | POA: Diagnosis not present

## 2024-01-20 LAB — BLOOD GAS, VENOUS
Acid-base deficit: 1.1 mmol/L (ref 0.0–2.0)
Bicarbonate: 24.8 mmol/L (ref 20.0–28.0)
O2 Saturation: 92.7 %
Patient temperature: 37
pCO2, Ven: 45 mmHg (ref 44–60)
pH, Ven: 7.35 (ref 7.25–7.43)
pO2, Ven: 64 mmHg — ABNORMAL HIGH (ref 32–45)

## 2024-01-20 LAB — CBC WITH DIFFERENTIAL/PLATELET
Abs Immature Granulocytes: 0.04 10*3/uL (ref 0.00–0.07)
Basophils Absolute: 0 10*3/uL (ref 0.0–0.1)
Basophils Relative: 0 %
Eosinophils Absolute: 0.1 10*3/uL (ref 0.0–0.5)
Eosinophils Relative: 1 %
HCT: 37.4 % — ABNORMAL LOW (ref 39.0–52.0)
Hemoglobin: 11.9 g/dL — ABNORMAL LOW (ref 13.0–17.0)
Immature Granulocytes: 1 %
Lymphocytes Relative: 35 %
Lymphs Abs: 2.3 10*3/uL (ref 0.7–4.0)
MCH: 28.9 pg (ref 26.0–34.0)
MCHC: 31.8 g/dL (ref 30.0–36.0)
MCV: 90.8 fL (ref 80.0–100.0)
Monocytes Absolute: 0.7 10*3/uL (ref 0.1–1.0)
Monocytes Relative: 10 %
Neutro Abs: 3.4 10*3/uL (ref 1.7–7.7)
Neutrophils Relative %: 53 %
Platelets: 308 10*3/uL (ref 150–400)
RBC: 4.12 MIL/uL — ABNORMAL LOW (ref 4.22–5.81)
RDW: 12.9 % (ref 11.5–15.5)
WBC: 6.5 10*3/uL (ref 4.0–10.5)
nRBC: 0 % (ref 0.0–0.2)

## 2024-01-20 LAB — URINALYSIS, ROUTINE W REFLEX MICROSCOPIC
Bacteria, UA: NONE SEEN
Bilirubin Urine: NEGATIVE
Glucose, UA: >=500 mg/dL — AB
Hgb urine dipstick: NEGATIVE
Ketones, ur: NEGATIVE mg/dL
Leukocytes,Ua: NEGATIVE
Nitrite: NEGATIVE
Protein, ur: NEGATIVE mg/dL
Specific Gravity, Urine: 1.026 (ref 1.005–1.030)
pH: 5 (ref 5.0–8.0)

## 2024-01-20 LAB — LIPID PANEL
Cholesterol: 167 mg/dL (ref 0–200)
HDL: 41.5 mg/dL (ref >39.00)
LDL Cholesterol: 46 mg/dL (ref 0–99)
NonHDL: 125.82
Total CHOL/HDL Ratio: 4
Triglycerides: 398 mg/dL — ABNORMAL HIGH (ref 0.0–149.0)
VLDL: 79.6 mg/dL — ABNORMAL HIGH (ref 0.0–40.0)

## 2024-01-20 LAB — COMPREHENSIVE METABOLIC PANEL
ALT: 14 U/L (ref 0–53)
AST: 14 U/L (ref 0–37)
Albumin: 4.3 g/dL (ref 3.5–5.2)
Alkaline Phosphatase: 75 U/L (ref 39–117)
BUN: 36 mg/dL — ABNORMAL HIGH (ref 6–23)
CO2: 28 meq/L (ref 19–32)
Calcium: 10 mg/dL (ref 8.4–10.5)
Chloride: 96 meq/L (ref 96–112)
Creatinine, Ser: 1.98 mg/dL — ABNORMAL HIGH (ref 0.40–1.50)
GFR: 38.64 mL/min — ABNORMAL LOW (ref >60.00)
Glucose, Bld: 523 mg/dL (ref 70–99)
Potassium: 5.2 meq/L — ABNORMAL HIGH (ref 3.5–5.1)
Sodium: 131 meq/L — ABNORMAL LOW (ref 135–145)
Total Bilirubin: 0.4 mg/dL (ref 0.2–1.2)
Total Protein: 8.2 g/dL (ref 6.0–8.3)

## 2024-01-20 LAB — BASIC METABOLIC PANEL
Anion gap: 10 (ref 5–15)
Anion gap: 10 (ref 5–15)
BUN: 38 mg/dL — ABNORMAL HIGH (ref 6–20)
BUN: 40 mg/dL — ABNORMAL HIGH (ref 6–20)
CO2: 23 mmol/L (ref 22–32)
CO2: 21 mmol/L — ABNORMAL LOW (ref 22–32)
Calcium: 9.4 mg/dL (ref 8.9–10.3)
Calcium: 9.2 mg/dL (ref 8.9–10.3)
Chloride: 99 mmol/L (ref 98–111)
Chloride: 99 mmol/L (ref 98–111)
Creatinine, Ser: 2.12 mg/dL — ABNORMAL HIGH (ref 0.61–1.24)
Creatinine, Ser: 2.12 mg/dL — ABNORMAL HIGH (ref 0.61–1.24)
GFR, Estimated: 37 mL/min — ABNORMAL LOW (ref >60)
GFR, Estimated: 37 mL/min — ABNORMAL LOW (ref >60)
Glucose, Bld: 492 mg/dL — ABNORMAL HIGH (ref 70–99)
Glucose, Bld: 483 mg/dL — ABNORMAL HIGH (ref 70–99)
Potassium: 5 mmol/L (ref 3.5–5.1)
Potassium: 5.1 mmol/L (ref 3.5–5.1)
Sodium: 132 mmol/L — ABNORMAL LOW (ref 135–145)
Sodium: 130 mmol/L — ABNORMAL LOW (ref 135–145)

## 2024-01-20 LAB — CBG MONITORING, ED: Glucose-Capillary: 421 mg/dL — ABNORMAL HIGH (ref 70–99)

## 2024-01-20 LAB — GLUCOSE, POCT (MANUAL RESULT ENTRY): POC Glucose: 466 mg/dL — AB (ref 70–99)

## 2024-01-20 LAB — MICROALBUMIN / CREATININE URINE RATIO
Creatinine,U: 71.6 mg/dL
Microalb Creat Ratio: 69.1 mg/g — ABNORMAL HIGH (ref 0.0–30.0)
Microalb, Ur: 4.9 mg/dL — ABNORMAL HIGH (ref 0.0–1.9)

## 2024-01-20 LAB — BETA-HYDROXYBUTYRIC ACID
Beta-Hydroxybutyric Acid: 0.1 mmol/L (ref 0.05–0.27)
Beta-Hydroxybutyric Acid: 0.12 mmol/L (ref 0.05–0.27)

## 2024-01-20 MED ORDER — EMPAGLIFLOZIN 10 MG PO TABS: 10.0000 mg | ORAL_TABLET | Freq: Every day | ORAL | 1 refills | Status: AC

## 2024-01-20 MED ORDER — TRULICITY 1.5 MG/0.5ML ~~LOC~~ SOAJ
1.5000 mg | SUBCUTANEOUS | 3 refills | Status: AC
Start: 2024-01-20 — End: ?

## 2024-01-20 NOTE — Progress Notes (Signed)
 Paul Roberson 53 y.o.   Chief Complaint  Patient presents with   Hospitalization Follow-up    Patient here for HFU for Hyperglycemia. Patient states he feels "strange" when he eats or drinks he coughs and sometimes vomits just depends on what he eats. Patient states he's been really fatigue doesn't want to do anything. He was in school because he was tired of driving but dropped out of school because of how tired he was. He's been feeling sick lightheaded, dizziness, bad heart burn. He is needing FMLA forms filled out, but doesn't have them right now     HISTORY OF PRESENT ILLNESS: This is a 53 y.o. male here for follow-up of emergency department visit on 01/13/2024 when he presented with uncontrolled diabetes Has not been fully compliant with medications. Feels better than a couple weeks ago but still does not feel back to normal. Lab Results  Component Value Date   HGBA1C 14.3 (H) 01/14/2024      HPI   Prior to Admission medications   Medication Sig Start Date End Date Taking? Authorizing Provider  amLODipine (NORVASC) 10 MG tablet Take 1 tablet (10 mg total) by mouth daily. 01/14/24  Yes Arby Barrette, MD  Aspirin-Salicylamide-Caffeine (BC HEADACHE POWDER PO) Take 1 packet by mouth 2 (two) times daily as needed (pain/headache).   Yes [provider]  citalopram (CELEXA) 10 MG tablet Take 1 tablet (10 mg total) by mouth daily. 04/07/23  Yes Mykira Hofmeister, Eilleen Kempf, MD  Continuous Glucose Receiver (FREESTYLE LIBRE 2 READER) DEVI Dose,Route,Frequency: As Directed 01/14/24  Yes Arby Barrette, MD  Continuous Glucose Sensor (FREESTYLE LIBRE 2 SENSOR) MISC Dose, Route Frequency: As Directed 01/14/24  Yes Pfeiffer, Lebron Conners, MD  glipiZIDE (GLUCOTROL) 5 MG tablet Take 1 tablet (5 mg total) by mouth every morning. 01/14/24  Yes Arby Barrette, MD  losartan-hydrochlorothiazide (HYZAAR) 50-12.5 MG tablet Take 1 tablet by mouth daily. 01/14/24  Yes Arby Barrette, MD  metFORMIN (GLUCOPHAGE)  1000 MG tablet Take 1 tablet (1,000 mg total) by mouth 2 (two) times daily with a meal. 01/14/24  Yes Pfeiffer, Lebron Conners, MD  rosuvastatin (CRESTOR) 5 MG tablet Take 1 tablet (5 mg total) by mouth at bedtime. 01/14/24 01/08/25 Yes Arby Barrette, MD  cyclobenzaprine (FLEXERIL) 10 MG tablet Take 1 tablet (10 mg total) by mouth 2 (two) times daily as needed for muscle spasms. Patient not taking: Reported on 01/20/2024 05/05/23   Michelle Piper, PA-C  Dulaglutide (TRULICITY) 1.5 MG/0.5ML SOPN Inject 1.5 mg into the skin once a week. Patient not taking: Reported on 01/20/2024 07/08/23   Georgina Quint, MD  empagliflozin (JARDIANCE) 10 MG TABS tablet Take 1 tablet (10 mg total) by mouth daily. Patient not taking: Reported on 01/20/2024 07/08/23   Georgina Quint, MD    Allergies  Allergen Reactions   Sulfa Antibiotics Other (See Comments)    Causes real bad headache   Penicillins Itching    Patient Active Problem List   Diagnosis Date Noted   AKI (acute kidney injury) (HCC) 01/13/2024   Spinal stenosis of lumbar region 05/12/2023   Moderate episode of recurrent major depressive disorder (HCC) 04/07/2023   Current smoker 07/01/2022   Chronic anemia 05/27/2022   Hypertension associated with type 2 diabetes mellitus (HCC) 05/28/2017   Type 2 diabetes mellitus without complication, without long-term current use of insulin (HCC) 05/28/2017    Past Medical History:  Diagnosis Date   Diabetes mellitus without complication (HCC)    Hypertension  No past surgical history on file.  Social History   Socioeconomic History   Marital status: Married    Spouse name: Not on file   Number of children: Not on file   Years of education: Not on file   Highest education level: Not on file  Occupational History   Not on file  Tobacco Use   Smoking status: Some Days    Types: Cigarettes   Smokeless tobacco: Never  Vaping Use   Vaping status: Never Used  Substance and Sexual Activity    Alcohol use: Not Currently   Drug use: No   Sexual activity: Yes  Other Topics Concern   Not on file  Social History Narrative   Not on file   Social Drivers of Health   Financial Resource Strain: Not on file  Food Insecurity: Not on file  Transportation Needs: No Transportation Needs (06/05/2022)   PRAPARE - Transportation    Lack of Transportation (Medical): No    Lack of Transportation (Non-Medical): No  Physical Activity: Not on file  Stress: Not on file  Social Connections: Not on file  Intimate Partner Violence: Not on file    Family History  Problem Relation Age of Onset   Diabetes Mother    Heart disease Mother    Hypertension Mother    Asthma Mother    Diabetes Father    Heart disease Father    Hypertension Father    Kidney disease Father    Cancer Father      Review of Systems  Constitutional: Negative.  Negative for chills and fever.  HENT: Negative.  Negative for congestion and sore throat.   Respiratory: Negative.  Negative for cough and shortness of breath.   Cardiovascular: Negative.  Negative for chest pain and palpitations.  Gastrointestinal:  Positive for nausea. Negative for abdominal pain, diarrhea and vomiting.  Genitourinary:  Positive for frequency. Negative for dysuria.  Skin: Negative.  Negative for rash.  Neurological:  Positive for dizziness.  All other systems reviewed and are negative.   Vitals:   01/20/24 1431  BP: (!) 140/90  Pulse: 100  Temp: 98.7 F (37.1 C)  SpO2: 97%    Physical Exam Vitals reviewed.  Constitutional:      Appearance: Normal appearance.  HENT:     Head: Normocephalic.     Mouth/Throat:     Mouth: Mucous membranes are moist.     Pharynx: Oropharynx is clear.  Eyes:     Extraocular Movements: Extraocular movements intact.     Conjunctiva/sclera: Conjunctivae normal.     Pupils: Pupils are equal, round, and reactive to light.  Cardiovascular:     Rate and Rhythm: Normal rate and regular rhythm.      Pulses: Normal pulses.     Heart sounds: Normal heart sounds.  Pulmonary:     Effort: Pulmonary effort is normal.     Breath sounds: Normal breath sounds.  Abdominal:     Palpations: Abdomen is soft.     Tenderness: There is no abdominal tenderness.  Musculoskeletal:     Cervical back: No tenderness.  Lymphadenopathy:     Cervical: No cervical adenopathy.  Skin:    General: Skin is warm and dry.     Capillary Refill: Capillary refill takes less than 2 seconds.  Neurological:     General: No focal deficit present.     Mental Status: He is alert and oriented to person, place, and time.  Psychiatric:  Mood and Affect: Mood normal.        Behavior: Behavior normal.    Results for orders placed or performed in visit on 01/20/24 (from the past 24 hours)  POCT glucose (manual entry)     Status: Abnormal   Collection Time: 01/20/24  3:15 PM  Result Value Ref Range   POC Glucose 466 (A) 70 - 99 mg/dl     ASSESSMENT & PLAN: A total of 47 minutes was spent with the patient and counseling/coordination of care regarding preparing for this visit, review of most recent office visit notes, review of multiple chronic medical conditions and their management, cardiovascular risks associated with uncontrolled hypertension, review of all medications and changes made, review of most recent bloodwork results, review of health maintenance items, education on nutrition, ED precautions, prognosis, documentation, and need for follow up.   Problem List Items Addressed This Visit       Cardiovascular and Mediastinum   Hypertension associated with type 2 diabetes mellitus (HCC) - Primary   Elevated blood pressure reading in the office.  Missed medication today Continue amlodipine 10 mg daily and Hyzaar 50-12.5 mg daily Uncontrolled diabetes.  Hemoglobin A1c at 14.3. Cardiovascular risks associated with diabetes and hypertension discussed Diet and nutrition discussed Restart weekly Trulicity 1.5  mg Restart Jardiance 10 mg daily.  Continue glipizide 5 mg twice a day and metformin 1000 mg twice a day Follow-up in 4 weeks      Relevant Medications   empagliflozin (JARDIANCE) 10 MG TABS tablet   Dulaglutide (TRULICITY) 1.5 MG/0.5ML SOAJ   Other Relevant Orders   Microalbumin / creatinine urine ratio   Comprehensive metabolic panel   CBC with Differential/Platelet   Lipid panel   POCT glucose (manual entry) (Completed)     Genitourinary   Acute kidney injury superimposed on chronic kidney disease (HCC)   Advised to stay well-hydrated and avoid NSAIDs CMP repeated today Needs to start SGLT inhibitor.  Recommend either Jardiance or Marcelline Deist as per insurance's formulary        Other   Current smoker   Cardiovascular and cancer risk associated with smoking discussed Smoking cessation advice given.      Patient Instructions  Diabetes Mellitus and Nutrition, Adult When you have diabetes, or diabetes mellitus, it is very important to have healthy eating habits because your blood sugar (glucose) levels are greatly affected by what you eat and drink. Eating healthy foods in the right amounts, at about the same times every day, can help you: Manage your blood glucose. Lower your risk of heart disease. Improve your blood pressure. Reach or maintain a healthy weight. What can affect my meal plan? Every person with diabetes is different, and each person has different needs for a meal plan. Your health care provider may recommend that you work with a dietitian to make a meal plan that is best for you. Your meal plan may vary depending on factors such as: The calories you need. The medicines you take. Your weight. Your blood glucose, blood pressure, and cholesterol levels. Your activity level. Other health conditions you have, such as heart or kidney disease. How do carbohydrates affect me? Carbohydrates, also called carbs, affect your blood glucose level more than any other type of  food. Eating carbs raises the amount of glucose in your blood. It is important to know how many carbs you can safely have in each meal. This is different for every person. Your dietitian can help you calculate how many carbs you  should have at each meal and for each snack. How does alcohol affect me? Alcohol can cause a decrease in blood glucose (hypoglycemia), especially if you use insulin or take certain diabetes medicines by mouth. Hypoglycemia can be a life-threatening condition. Symptoms of hypoglycemia, such as sleepiness, dizziness, and confusion, are similar to symptoms of having too much alcohol. Do not drink alcohol if: Your health care provider tells you not to drink. You are pregnant, may be pregnant, or are planning to become pregnant. If you drink alcohol: Limit how much you have to: 0-1 drink a day for women. 0-2 drinks a day for men. Know how much alcohol is in your drink. In the [place] one drink equals one 12 oz bottle of beer (355 mL), one 5 oz glass of wine (148 mL), or one 1 oz glass of hard liquor (44 mL). Keep yourself hydrated with water, diet soda, or unsweetened iced tea. Keep in mind that regular soda, juice, and other mixers may contain a lot of sugar and must be counted as carbs. What are tips for following this plan?  Reading food labels Start by checking the serving size on the Nutrition Facts label of packaged foods and drinks. The number of calories and the amount of carbs, fats, and other nutrients listed on the label are based on one serving of the item. Many items contain more than one serving per package. Check the total grams (g) of carbs in one serving. Check the number of grams of saturated fats and trans fats in one serving. Choose foods that have a low amount or none of these fats. Check the number of milligrams (mg) of salt (sodium) in one serving. Most people should limit total sodium intake to less than 2,300 mg per day. Always check the nutrition  information of foods labeled as "low-fat" or "nonfat." These foods may be higher in added sugar or refined carbs and should be avoided. Talk to your dietitian to identify your daily goals for nutrients listed on the label. Shopping Avoid buying canned, pre-made, or processed foods. These foods tend to be high in fat, sodium, and added sugar. Shop around the outside edge of the grocery store. This is where you will most often find fresh fruits and vegetables, bulk grains, fresh meats, and fresh dairy products. Cooking Use low-heat cooking methods, such as baking, instead of high-heat cooking methods, such as deep frying. Cook using healthy oils, such as olive, canola, or sunflower oil. Avoid cooking with butter, cream, or high-fat meats. Meal planning Eat meals and snacks regularly, preferably at the same times every day. Avoid going long periods of time without eating. Eat foods that are high in fiber, such as fresh fruits, vegetables, beans, and whole grains. Eat 4-6 oz (112-168 g) of lean protein each day, such as lean meat, chicken, fish, eggs, or tofu. One ounce (oz) (28 g) of lean protein is equal to: 1 oz (28 g) of meat, chicken, or fish. 1 egg.  cup (62 g) of tofu. Eat some foods each day that contain healthy fats, such as avocado, nuts, seeds, and fish. What foods should I eat? Fruits Berries. Apples. Oranges. Peaches. Apricots. Plums. Grapes. Mangoes. Papayas. Pomegranates. Kiwi. Cherries. Vegetables Leafy greens, including lettuce, spinach, kale, chard, collard greens, mustard greens, and cabbage. Beets. Cauliflower. Broccoli. Carrots. Kadlec beans. Tomatoes. Peppers. Onions. Cucumbers. Brussels sprouts. Grains Whole grains, such as whole-wheat or whole-grain bread, crackers, tortillas, cereal, and pasta. Unsweetened oatmeal. Quinoa. Brown or wild rice. Meats and other  proteins Seafood. Poultry without skin. Lean cuts of poultry and beef. Tofu. Nuts. Seeds. Dairy Low-fat or  fat-free dairy products such as milk, yogurt, and cheese. The items listed above may not be a complete list of foods and beverages you can eat and drink. Contact a dietitian for more information. What foods should I avoid? Fruits Fruits canned with syrup. Vegetables Canned vegetables. Frozen vegetables with butter or cream sauce. Grains Refined white flour and flour products such as bread, pasta, snack foods, and cereals. Avoid all processed foods. Meats and other proteins Fatty cuts of meat. Poultry with skin. Breaded or fried meats. Processed meat. Avoid saturated fats. Dairy Full-fat yogurt, cheese, or milk. Beverages Sweetened drinks, such as soda or iced tea. The items listed above may not be a complete list of foods and beverages you should avoid. Contact a dietitian for more information. Questions to ask a health care provider Do I need to meet with a certified diabetes care and education specialist? Do I need to meet with a dietitian? What number can I call if I have questions? When are the best times to check my blood glucose? Where to find more information: American Diabetes Association: diabetes.org Academy of Nutrition and Dietetics: eatright.[employer] of Diabetes and Digestive and Kidney Diseases: StageSync.si Association of Diabetes Care & Education Specialists: diabeteseducator.org Summary It is important to have healthy eating habits because your blood sugar (glucose) levels are greatly affected by what you eat and drink. It is important to use alcohol carefully. A healthy meal plan will help you manage your blood glucose and lower your risk of heart disease. Your health care provider may recommend that you work with a dietitian to make a meal plan that is best for you. This information is not intended to replace advice given to you by your health care provider. Make sure you discuss any questions you have with your health care provider. Document Revised:  06/05/2020 Document Reviewed: 06/06/2020 Elsevier Patient Education  2024 Elsevier Inc.    Edwina Barth, MD [facility]

## 2024-01-20 NOTE — Telephone Encounter (Signed)
 [facility] called for the following critical lab:   Critical Glucose- 523 Drawn today at 3:21 PM  On call provider, Dr. Junie Panning, was notified. Dr. Darrick Huntsman recommends pt goes to ED. Pt contacted and told this recommendation but pt states he is going to work and will go in the morning. This RN emphasized importance of going to ED now.

## 2024-01-20 NOTE — Patient Instructions (Signed)

## 2024-01-20 NOTE — ED Provider Triage Note (Signed)
 Emergency Medicine Provider Triage Evaluation Note  Paul Roberson , a 53 y.o. male  was evaluated in triage.  Pt complains of hyperglycemia.  Review of Systems  Positive:  Negative:   Physical Exam  BP (!) 159/84 (BP Location: Right Arm)   Pulse (!) 109   Temp 98 F (36.7 C) (Oral)   Resp 18   SpO2 99%  Gen:   Awake, no distress   Resp:  Normal effort  MSK:   Moves extremities without difficulty  Other:    Medical Decision Making  Medically screening exam initiated at 7:11 PM.  Appropriate orders placed.  Paul Roberson was informed that the remainder of the evaluation will be completed by another provider, this initial triage assessment does not replace that evaluation, and the importance of remaining in the ED until their evaluation is complete.  Has not been on DM medications. Vomited last night. Lightheaded today. Also with URI 3 weeks ago and states that he is still coughing from that.    Dorthy Cooler, [place] 01/20/24 [phone]

## 2024-01-20 NOTE — Assessment & Plan Note (Signed)
 Advised to stay well-hydrated and avoid NSAIDs CMP repeated today Needs to start SGLT inhibitor.  Recommend either Jardiance or Marcelline Deist as per insurance's formulary

## 2024-01-20 NOTE — Telephone Encounter (Signed)
 Copied from CRM [id number]. Topic: Clinical - Lab/Test Results >> Jan 20, 2024  5:26 PM Denese Killings wrote: Reason for CRM: Janina Mayo called to give a critical lab on patient.   Patient calling because he was told to go to the ED for his blood sugar of 466 and wasn't sure what ED to go to. I advised that any ED would be able to treat him. Patient understood and had no further questions.    Reason for Disposition  [1] Follow-up call to recent contact AND [2] information only call, no triage required  Answer Assessment - Initial Assessment Questions 1. REASON FOR CALL or QUESTION: "What is your reason for calling today?" or "How can I best help you?" or "What question do you have that I can help answer?"     Patient calling because he was told to go to the ED for his blood sugar of 466 and wasn't sure what ED to go to.  Protocols used: Information Only Call - No Triage-A-AH

## 2024-01-20 NOTE — ED Triage Notes (Signed)
 Pt had appt at PCP today and BS was in the 500s in the bloodwork. PCP told pt to come to ER. Pt is on metformin, glipizide, and trulicity. Pt ran out of medications so did not have diabetes medications for 30 days. PCP refilled them.

## 2024-01-20 NOTE — Assessment & Plan Note (Signed)
 Elevated blood pressure reading in the office.  Missed medication today Continue amlodipine 10 mg daily and Hyzaar 50-12.5 mg daily Uncontrolled diabetes.  Hemoglobin A1c at 14.3. Cardiovascular risks associated with diabetes and hypertension discussed Diet and nutrition discussed Restart weekly Trulicity 1.5 mg Restart Jardiance 10 mg daily.  Continue glipizide 5 mg twice a day and metformin 1000 mg twice a day Follow-up in 4 weeks

## 2024-01-20 NOTE — Assessment & Plan Note (Signed)
Cardiovascular and cancer risk associated with smoking discussed. Smoking cessation advice given. 

## 2024-01-21 LAB — CBC WITH DIFFERENTIAL/PLATELET
Basophils Absolute: 0 10*3/uL (ref 0.0–0.1)
Basophils Relative: 0.5 % (ref 0.0–3.0)
Eosinophils Absolute: 0 10*3/uL (ref 0.0–0.7)
Eosinophils Relative: 0.7 % (ref 0.0–5.0)
HCT: 36.7 % — ABNORMAL LOW (ref 39.0–52.0)
Hemoglobin: 12.2 g/dL — ABNORMAL LOW (ref 13.0–17.0)
Lymphocytes Relative: 33.6 % (ref 12.0–46.0)
Lymphs Abs: 2.2 10*3/uL (ref 0.7–4.0)
MCHC: 33.4 g/dL (ref 30.0–36.0)
MCV: 88.6 fl (ref 78.0–100.0)
Monocytes Absolute: 0.7 10*3/uL (ref 0.1–1.0)
Monocytes Relative: 10.1 % (ref 3.0–12.0)
Neutro Abs: 3.7 10*3/uL (ref 1.4–7.7)
Neutrophils Relative %: 55.1 % (ref 43.0–77.0)
Platelets: 314 10*3/uL (ref 150.0–400.0)
RBC: 4.14 Mil/uL — ABNORMAL LOW (ref 4.22–5.81)
RDW: 13.9 % (ref 11.5–15.5)
WBC: 6.6 10*3/uL (ref 4.0–10.5)

## 2024-01-21 MED ORDER — METFORMIN HCL 500 MG PO TABS
1000.0000 mg | ORAL_TABLET | Freq: Once | ORAL | Status: AC
  Administered 2024-01-21: 1000 mg via ORAL
  Filled 2024-01-21: qty 2

## 2024-01-21 MED ORDER — LACTATED RINGERS IV BOLUS
1000.0000 mL | Freq: Once | INTRAVENOUS | Status: AC
  Administered 2024-01-21: 1000 mL via INTRAVENOUS

## 2024-01-21 NOTE — Discharge Instructions (Addendum)
 Evaluation today was overall reassuring.  I suspect your high blood sugar is related to not taking your medications for diabetes.  Please continue taking your metformin and your glipizide as prescribed.  If you develop nausea vomiting, shortness of breath, abdominal pain, fever or any other concerning symptom please return emergency department further evaluation.  Otherwise please follow-up with PCP.

## 2024-01-21 NOTE — ED Provider Notes (Signed)
 [place] EMERGENCY DEPARTMENT AT [facility] Provider Note   CSN: [id number] Arrival date & time: 01/20/24  1826     History  Chief Complaint  Patient presents with   Hyperglycemia   HPI Paul Roberson is a 53 y.o. male with type 2 diabetes, hypertension presenting for hyperglycemia.  Patient states that he was at his PCP today and his blood sugar was in the 500s.  He was at that point instructed to come to the ER.  Patient normally takes metformin and glipizide and Trulicity but states he ran out of his medications about a month ago.  PCP refilled them today.  He denies nausea vomiting diarrhea.  Denies abdominal pain.  Does state that he has had a cough for about 3 weeks that is nonproductive.  Denies nasal congestion or fever.  Denies shortness of breath or chest pain.   Hyperglycemia      Home Medications Prior to Admission medications   Medication Sig Start Date End Date Taking? Authorizing Provider  amLODipine (NORVASC) 10 MG tablet Take 1 tablet (10 mg total) by mouth daily. 01/14/24   Arby Barrette, MD  Aspirin-Salicylamide-Caffeine (BC HEADACHE POWDER PO) Take 1 packet by mouth 2 (two) times daily as needed (pain/headache).    [provider]  citalopram (CELEXA) 10 MG tablet Take 1 tablet (10 mg total) by mouth daily. 04/07/23   Georgina Quint, MD  Continuous Glucose Receiver (FREESTYLE LIBRE 2 READER) DEVI Dose,Route,Frequency: As Directed 01/14/24   Arby Barrette, MD  Continuous Glucose Sensor (FREESTYLE LIBRE 2 SENSOR) MISC Dose, Route Frequency: As Directed 01/14/24   Arby Barrette, MD  cyclobenzaprine (FLEXERIL) 10 MG tablet Take 1 tablet (10 mg total) by mouth 2 (two) times daily as needed for muscle spasms. Patient not taking: Reported on 01/20/2024 05/05/23   Michelle Piper, PA-C  Dulaglutide (TRULICITY) 1.5 MG/0.5ML SOAJ Inject 1.5 mg into the skin once a week. 01/20/24   Georgina Quint, MD  empagliflozin (JARDIANCE) 10 MG TABS  tablet Take 1 tablet (10 mg total) by mouth daily. 01/20/24   Georgina Quint, MD  glipiZIDE (GLUCOTROL) 5 MG tablet Take 1 tablet (5 mg total) by mouth every morning. 01/14/24   Arby Barrette, MD  losartan-hydrochlorothiazide (HYZAAR) 50-12.5 MG tablet Take 1 tablet by mouth daily. 01/14/24   Arby Barrette, MD  metFORMIN (GLUCOPHAGE) 1000 MG tablet Take 1 tablet (1,000 mg total) by mouth 2 (two) times daily with a meal. 01/14/24   Arby Barrette, MD  rosuvastatin (CRESTOR) 5 MG tablet Take 1 tablet (5 mg total) by mouth at bedtime. 01/14/24 01/08/25  Arby Barrette, MD      Allergies    Sulfa antibiotics and Penicillins    Review of Systems   See HPI  Physical Exam Updated Vital Signs BP (!) 123/92 (BP Location: Right Arm)   Pulse 91   Temp 97.9 F (36.6 C) (Oral)   Resp 14   SpO2 98%  Physical Exam Vitals and nursing note reviewed.  HENT:     Head: Normocephalic and atraumatic.     Mouth/Throat:     Mouth: Mucous membranes are moist.  Eyes:     General:        Right eye: No discharge.        Left eye: No discharge.     Conjunctiva/sclera: Conjunctivae normal.  Cardiovascular:     Rate and Rhythm: Normal rate and regular rhythm.     Pulses: Normal pulses.  Heart sounds: Normal heart sounds.  Pulmonary:     Effort: Pulmonary effort is normal.     Breath sounds: Normal breath sounds.  Abdominal:     General: Abdomen is flat.     Palpations: Abdomen is soft.  Skin:    General: Skin is warm and dry.  Neurological:     General: No focal deficit present.  Psychiatric:        Mood and Affect: Mood normal.     ED Results / Procedures / Treatments   Labs (all labs ordered are listed, but only abnormal results are displayed) Labs Reviewed  BASIC METABOLIC PANEL - Abnormal; Notable for the following components:      Result Value   Sodium 132 (*)    Glucose, Bld 492 (*)    BUN 38 (*)    Creatinine, Ser 2.12 (*)    GFR, Estimated 37 (*)    All other  components within normal limits  BASIC METABOLIC PANEL - Abnormal; Notable for the following components:   Sodium 130 (*)    CO2 21 (*)    Glucose, Bld 483 (*)    BUN 40 (*)    Creatinine, Ser 2.12 (*)    GFR, Estimated 37 (*)    All other components within normal limits  CBC WITH DIFFERENTIAL/PLATELET - Abnormal; Notable for the following components:   RBC 4.12 (*)    Hemoglobin 11.9 (*)    HCT 37.4 (*)    All other components within normal limits  URINALYSIS, ROUTINE W REFLEX MICROSCOPIC - Abnormal; Notable for the following components:   Glucose, UA >=500 (*)    All other components within normal limits  BLOOD GAS, VENOUS - Abnormal; Notable for the following components:   pO2, Ven 64 (*)    All other components within normal limits  CBG MONITORING, ED - Abnormal; Notable for the following components:   Glucose-Capillary 421 (*)    All other components within normal limits  BETA-HYDROXYBUTYRIC ACID  BETA-HYDROXYBUTYRIC ACID  BASIC METABOLIC PANEL  BASIC METABOLIC PANEL  BETA-HYDROXYBUTYRIC ACID  BETA-HYDROXYBUTYRIC ACID  BASIC METABOLIC PANEL  BETA-HYDROXYBUTYRIC ACID    EKG None  Radiology DG Chest 2 View Result Date: 01/20/2024 CLINICAL DATA:  Cough.  Hyperglycemia.  Diabetes. EXAM: CHEST - 2 VIEW COMPARISON:  01/13/2024 FINDINGS: The heart size and mediastinal contours are within normal limits. Both lungs are clear. The visualized skeletal structures are unremarkable. IMPRESSION: No active cardiopulmonary disease. Electronically Signed   By: Danae Orleans M.D.   On: 01/20/2024 21:36    Procedures Procedures    Medications Ordered in ED Medications  metFORMIN (GLUCOPHAGE) tablet 1,000 mg (1,000 mg Oral Given 01/21/24 0037)  lactated ringers bolus 1,000 mL (1,000 mLs Intravenous New Bag/Given 01/21/24 0053)    ED Course/ Medical Decision Making/ A&P                                 Medical Decision Making Risk Prescription drug management.   Initial Impression  and Ddx 53 year old well-appearing male presenting for hyperglycemia.  Exam was unremarkable.  DDx includes DKA, HHS, sepsis, poorly controlled diabetes, pneumonia, other. Patient PMH that increases complexity of ED encounter:   type 2 diabetes, hypertension  Interpretation of Diagnostics I independent reviewed and interpreted the labs as followed: Hyperglycemia (483)  - I independently visualized the following imaging with scope of interpretation limited to determining acute life threatening conditions related to  emergency care: CXR, which revealed no acute disease  Patient Reassessment and Ultimate Disposition/Management On reassessment, overall workup does not suggest DKA or HHS or active infection.  Suspect this is hyperglycemia related to poor med compliance.  Treated with fluids and gave him a dose of his metformin.  Offered to refill his medications for diabetes but he stated that he had planned refills at home and just has not been thinking about taking them I strongly advised him to continue taking them as prescribed.  Advised to follow-up with his PCP.  Discussed return precautions.  Discharged in good condition.  Patient management required discussion with the following services or consulting groups:  None  Complexity of Problems Addressed Acute complicated illness or Injury  Additional Data Reviewed and Analyzed Further history obtained from: Further history from spouse/family member, Past medical history and medications listed in the EMR, and Prior ED visit notes  Patient Encounter Risk Assessment None         Final Clinical Impression(s) / ED Diagnoses Final diagnoses:  Hyperglycemia    Rx / DC Orders ED Discharge Orders     None         Gareth Eagle, PA-C 01/21/24 0127    Anders Simmonds T, DO 01/28/24 [phone]

## 2024-01-22 ENCOUNTER — Telehealth: Payer: Self-pay

## 2024-01-22 NOTE — Transitions of Care (Post Inpatient/ED Visit) (Signed)
 01/22/2024  Name: Paul Roberson MRN: [id number] DOB: 1971-02-04  Today's TOC FU Call Status: Today's TOC FU Call Status:: Successful TOC FU Call Completed TOC FU Call Complete Date: 01/22/24 Patient's Name and Date of Birth confirmed.  Transition Care Management Follow-up Telephone Call Date of Discharge: 01/21/24 Discharge Facility: [facility]) Type of Discharge: Emergency Department Primary Inpatient Discharge Diagnosis:: hyperglycemia How have you been since you were released from the hospital?: Better Any questions or concerns?: No  Items Reviewed: Did you receive and understand the discharge instructions provided?: Yes Medications obtained,verified, and reconciled?: Yes (Medications Reviewed) Any new allergies since your discharge?: No Dietary orders reviewed?: Yes Do you have support at home?: No  Medications Reviewed Today: Medications Reviewed Today     Reviewed by Karena Addison, LPN (Licensed Practical Nurse) on 01/22/24 at 1055  Med List Status: <None>   Medication Order Taking? Sig Documenting Provider Last Dose Status Informant  amLODipine (NORVASC) 10 MG tablet [id number] No Take 1 tablet (10 mg total) by mouth daily. Arby Barrette, MD Taking Active   Aspirin-Salicylamide-Caffeine [facility] HEADACHE POWDER PO) [id number] No Take 1 packet by mouth 2 (two) times daily as needed (pain/headache). [provider] Taking Active Self  citalopram (CELEXA) 10 MG tablet [id number] No Take 1 tablet (10 mg total) by mouth daily. Georgina Quint, MD Taking Active   Continuous Glucose Receiver (FREESTYLE LIBRE 2 READER) [place] [id number] No Dose,Route,Frequency: As Directed Arby Barrette, MD Taking Active   Continuous Glucose Sensor (FREESTYLE LIBRE 2 SENSOR) [place] [id number] No Dose, Route Frequency: As Directed Arby Barrette, MD Taking Active   cyclobenzaprine (FLEXERIL) 10 MG tablet [id number] No Take 1 tablet (10 mg total) by mouth 2 (two) times daily as needed for  muscle spasms.  Patient not taking: Reported on 01/20/2024   Michelle Piper, PA-C Not Taking Active   Dulaglutide (TRULICITY) 1.5 MG/0.5ML [place] [id number]  Inject 1.5 mg into the skin once a week. Georgina Quint, MD  Active   empagliflozin (JARDIANCE) 10 MG TABS tablet [id number]  Take 1 tablet (10 mg total) by mouth daily. Georgina Quint, MD  Active   glipiZIDE (GLUCOTROL) 5 MG tablet [id number] No Take 1 tablet (5 mg total) by mouth every morning. Arby Barrette, MD Taking Active   losartan-hydrochlorothiazide [facility]) 50-12.5 MG tablet [id number] No Take 1 tablet by mouth daily. Arby Barrette, MD Taking Active   metFORMIN (GLUCOPHAGE) 1000 MG tablet [id number] No Take 1 tablet (1,000 mg total) by mouth 2 (two) times daily with a meal. Arby Barrette, MD Taking Active   rosuvastatin (CRESTOR) 5 MG tablet [id number] No Take 1 tablet (5 mg total) by mouth at bedtime. Arby Barrette, MD Taking Active             Home Care and Equipment/Supplies: Were Home Health Services Ordered?: NA Any new equipment or medical supplies ordered?: NA  Functional Questionnaire: Do you need assistance with bathing/showering or dressing?: No Do you need assistance with meal preparation?: No Do you need assistance with eating?: No Do you have difficulty maintaining continence: No Do you need assistance with getting out of bed/getting out of a chair/moving?: No Do you have difficulty managing or taking your medications?: No  Follow up appointments reviewed: PCP Follow-up appointment confirmed?: No (declined appt) MD Provider Line [phone] Given: No Specialist Hospital Follow-up appointment confirmed?: NA Do you need transportation to your follow-up appointment?: No Do you understand care options if your condition(s) worsen?: Yes-patient verbalized understanding  SIGNATURE Karena Addison, LPN [facility] Nurse Health Advisor Direct Dial [phone]

## 2024-01-28 ENCOUNTER — Telehealth: Payer: Self-pay

## 2024-01-28 NOTE — Telephone Encounter (Signed)
 Patient was identified as falling into the [place] Measure - Diabetes.   Patient was: Appointment scheduled with primary care provider in the next 30 days. Patient is on a 6 month schedule

## 2024-03-01 ENCOUNTER — Ambulatory Visit: Admitting: Emergency Medicine

## 2024-03-07 ENCOUNTER — Ambulatory Visit: Admitting: Emergency Medicine

## 2024-03-07 ENCOUNTER — Encounter: Payer: Self-pay | Admitting: Emergency Medicine

## 2024-03-07 VITALS — BP 138/86 | HR 98 | Temp 98.4°F | Ht 70.0 in | Wt 218.2 lb

## 2024-03-07 DIAGNOSIS — Z7984 Long term (current) use of oral hypoglycemic drugs: Secondary | ICD-10-CM | POA: Diagnosis not present

## 2024-03-07 DIAGNOSIS — I152 Hypertension secondary to endocrine disorders: Secondary | ICD-10-CM | POA: Diagnosis not present

## 2024-03-07 DIAGNOSIS — F4323 Adjustment disorder with mixed anxiety and depressed mood: Secondary | ICD-10-CM | POA: Diagnosis not present

## 2024-03-07 DIAGNOSIS — Z7985 Long-term (current) use of injectable non-insulin antidiabetic drugs: Secondary | ICD-10-CM

## 2024-03-07 DIAGNOSIS — E119 Type 2 diabetes mellitus without complications: Secondary | ICD-10-CM

## 2024-03-07 DIAGNOSIS — E1159 Type 2 diabetes mellitus with other circulatory complications: Secondary | ICD-10-CM

## 2024-03-07 LAB — GLUCOSE, POCT (MANUAL RESULT ENTRY): POC Glucose: 283 mg/dL — AB (ref 70–99)

## 2024-03-07 NOTE — Progress Notes (Signed)
 Paul Roberson 53 y.o.   Chief Complaint  Patient presents with   Medical Management of Chronic Issues    4 weeks follow, acid reflux     HISTORY OF PRESENT ILLNESS: This is a 53 y.o. male here for 4-week follow-up of uncontrolled diabetes Was able to get back on Trulicity  and Jardiance  on top of his metformin  and glipizide  No other complaints or medical concerns today. Last office visit plan of action as follows: Hypertension associated with type 2 diabetes mellitus (HCC) - Primary     Elevated blood pressure reading in the office.  Missed medication today Continue amlodipine  10 mg daily and Hyzaar 50-12.5 mg daily Uncontrolled diabetes.  Hemoglobin A1c at 14.3. Cardiovascular risks associated with diabetes and hypertension discussed Diet and nutrition discussed Restart weekly Trulicity  1.5 mg Restart Jardiance  10 mg daily.  Continue glipizide  5 mg twice a day and metformin  1000 mg twice a day Follow-up in 4 weeks      HPI   Prior to Admission medications   Medication Sig Start Date End Date Taking? Authorizing Provider  amLODipine  (NORVASC ) 10 MG tablet Take 1 tablet (10 mg total) by mouth daily. 01/14/24  Yes Wynetta Heckle, MD  Aspirin-Salicylamide-Caffeine (BC HEADACHE POWDER PO) Take 1 packet by mouth 2 (two) times daily as needed (pain/headache).   Yes [provider]  citalopram  (CELEXA ) 10 MG tablet Take 1 tablet (10 mg total) by mouth daily. 04/07/23  Yes Jamarri Vuncannon, Isidro Margo, MD  Continuous Glucose Receiver (FREESTYLE LIBRE 2 READER) DEVI Dose,Route,Frequency: As Directed 01/14/24  Yes Wynetta Heckle, MD  Continuous Glucose Sensor (FREESTYLE LIBRE 2 SENSOR) MISC Dose, Route Frequency: As Directed 01/14/24  Yes Wynetta Heckle, MD  cyclobenzaprine  (FLEXERIL ) 10 MG tablet Take 1 tablet (10 mg total) by mouth 2 (two) times daily as needed for muscle spasms. 05/05/23  Yes Schutt, Coni Deep, PA-C  Dulaglutide  (TRULICITY ) 1.5 MG/0.5ML SOAJ Inject 1.5 mg into the  skin once a week. 01/20/24  Yes Margret Moat, Isidro Margo, MD  empagliflozin  (JARDIANCE ) 10 MG TABS tablet Take 1 tablet (10 mg total) by mouth daily. 01/20/24  Yes Lanise Mergen, Isidro Margo, MD  glipiZIDE  (GLUCOTROL ) 5 MG tablet Take 1 tablet (5 mg total) by mouth every morning. 01/14/24  Yes Wynetta Heckle, MD  losartan -hydrochlorothiazide (HYZAAR) 50-12.5 MG tablet Take 1 tablet by mouth daily. 01/14/24  Yes Wynetta Heckle, MD  metFORMIN  (GLUCOPHAGE ) 1000 MG tablet Take 1 tablet (1,000 mg total) by mouth 2 (two) times daily with a meal. 01/14/24  Yes Pfeiffer, Bufford Carne, MD  rosuvastatin  (CRESTOR ) 5 MG tablet Take 1 tablet (5 mg total) by mouth at bedtime. 01/14/24 01/08/25 Yes Wynetta Heckle, MD    Allergies  Allergen Reactions   Sulfa Antibiotics Other (See Comments)    Causes real bad headache   Penicillins Itching    Patient Active Problem List   Diagnosis Date Noted   Acute kidney injury superimposed on chronic kidney disease (HCC) 01/13/2024   Spinal stenosis of lumbar region 05/12/2023   Moderate episode of recurrent major depressive disorder (HCC) 04/07/2023   Current smoker 07/01/2022   Chronic anemia 05/27/2022   Hypertension associated with type 2 diabetes mellitus (HCC) 05/28/2017   Type 2 diabetes mellitus without complication, without long-term current use of insulin  (HCC) 05/28/2017    Past Medical History:  Diagnosis Date   Diabetes mellitus without complication (HCC)    Hypertension     No past surgical history on file.  Social History   Socioeconomic History  Marital status: Legally Separated    Spouse name: Not on file   Number of children: Not on file   Years of education: Not on file   Highest education level: Not on file  Occupational History   Not on file  Tobacco Use   Smoking status: Some Days    Types: Cigarettes   Smokeless tobacco: Never  Vaping Use   Vaping status: Never Used  Substance and Sexual Activity   Alcohol use: Not Currently   Drug use: No    Sexual activity: Yes  Other Topics Concern   Not on file  Social History Narrative   Not on file   Social Drivers of Health   Financial Resource Strain: Not on file  Food Insecurity: Not on file  Transportation Needs: No Transportation Needs (06/05/2022)   PRAPARE - Transportation    Lack of Transportation (Medical): No    Lack of Transportation (Non-Medical): No  Physical Activity: Not on file  Stress: Not on file  Social Connections: Not on file  Intimate Partner Violence: Not on file    Family History  Problem Relation Age of Onset   Diabetes Mother    Heart disease Mother    Hypertension Mother    Asthma Mother    Diabetes Father    Heart disease Father    Hypertension Father    Kidney disease Father    Cancer Father      Review of Systems  Constitutional: Negative.  Negative for chills and fever.  HENT: Negative.  Negative for congestion and sore throat.   Respiratory: Negative.  Negative for cough and shortness of breath.   Cardiovascular: Negative.  Negative for chest pain and palpitations.  Gastrointestinal:  Negative for abdominal pain, diarrhea, nausea and vomiting.  Genitourinary: Negative.  Negative for dysuria and hematuria.  Skin: Negative.  Negative for rash.  Neurological: Negative.  Negative for dizziness and headaches.  All other systems reviewed and are negative.   Vitals:   03/07/24 1005  BP: 138/86  Pulse: 98  Temp: 98.4 F (36.9 C)  SpO2: 99%    Physical Exam Constitutional:      Appearance: Normal appearance.  HENT:     Head: Normocephalic.     Mouth/Throat:     Mouth: Mucous membranes are moist.     Pharynx: Oropharynx is clear.  Eyes:     Extraocular Movements: Extraocular movements intact.     Pupils: Pupils are equal, round, and reactive to light.  Cardiovascular:     Rate and Rhythm: Normal rate and regular rhythm.     Pulses: Normal pulses.     Heart sounds: Normal heart sounds.  Pulmonary:     Effort: Pulmonary  effort is normal.     Breath sounds: Normal breath sounds.  Musculoskeletal:     Cervical back: No tenderness.  Lymphadenopathy:     Cervical: No cervical adenopathy.  Skin:    General: Skin is warm and dry.     Capillary Refill: Capillary refill takes less than 2 seconds.  Neurological:     General: No focal deficit present.     Mental Status: He is alert and oriented to person, place, and time.  Psychiatric:        Mood and Affect: Mood normal.        Behavior: Behavior normal.      ASSESSMENT & PLAN: A total of 46 minutes was spent with the patient and counseling/coordination of care regarding preparing for this visit, review  of most recent office visit notes, review of multiple chronic medical conditions and their management, cardiovascular risks associated with uncontrolled diabetes, review of all medications, review of most recent bloodwork results, review of health maintenance items, education on nutrition, prognosis, documentation, and need for follow up.  Problem List Items Addressed This Visit       Cardiovascular and Mediastinum   Hypertension associated with type 2 diabetes mellitus (HCC) - Primary   BP Readings from Last 3 Encounters:  03/07/24 138/86  01/20/24 (!) 123/92  01/20/24 (!) 140/90  Well-controlled hypertension Continue amlodipine  10 mg daily Glucose today 283.  Better than before Continue weekly Trulicity  1.5 mg and daily Jardiance  10 mg Continue daily glipizide  5 mg twice a day and metformin  1000 mg twice a day Cardiovascular risks associated with uncontrolled diabetes discussed Diet and nutrition discussed Follow-up in 2 months          Other   Adjustment reaction with anxiety and depression   Still struggling with both depression and anxiety Continue Celexa  10 mg daily Needs psychiatric evaluation. Referral placed today.      Relevant Orders   Ambulatory referral to Psychology   Patient Instructions  Diabetes Mellitus and Nutrition,  Adult When you have diabetes, or diabetes mellitus, it is very important to have healthy eating habits because your blood sugar (glucose) levels are greatly affected by what you eat and drink. Eating healthy foods in the right amounts, at about the same times every day, can help you: Manage your blood glucose. Lower your risk of heart disease. Improve your blood pressure. Reach or maintain a healthy weight. What can affect my meal plan? Every person with diabetes is different, and each person has different needs for a meal plan. Your health care provider may recommend that you work with a dietitian to make a meal plan that is best for you. Your meal plan may vary depending on factors such as: The calories you need. The medicines you take. Your weight. Your blood glucose, blood pressure, and cholesterol levels. Your activity level. Other health conditions you have, such as heart or kidney disease. How do carbohydrates affect me? Carbohydrates, also called carbs, affect your blood glucose level more than any other type of food. Eating carbs raises the amount of glucose in your blood. It is important to know how many carbs you can safely have in each meal. This is different for every person. Your dietitian can help you calculate how many carbs you should have at each meal and for each snack. How does alcohol affect me? Alcohol can cause a decrease in blood glucose (hypoglycemia), especially if you use insulin  or take certain diabetes medicines by mouth. Hypoglycemia can be a life-threatening condition. Symptoms of hypoglycemia, such as sleepiness, dizziness, and confusion, are similar to symptoms of having too much alcohol. Do not drink alcohol if: Your health care provider tells you not to drink. You are pregnant, may be pregnant, or are planning to become pregnant. If you drink alcohol: Limit how much you have to: 0-1 drink a day for women. 0-2 drinks a day for men. Know how much alcohol is  in your drink. In the [place] one drink equals one 12 oz bottle of beer (355 mL), one 5 oz glass of wine (148 mL), or one 1 oz glass of hard liquor (44 mL). Keep yourself hydrated with water, diet soda, or unsweetened iced tea. Keep in mind that regular soda, juice, and other mixers may contain a lot of  sugar and must be counted as carbs. What are tips for following this plan?  Reading food labels Start by checking the serving size on the Nutrition Facts label of packaged foods and drinks. The number of calories and the amount of carbs, fats, and other nutrients listed on the label are based on one serving of the item. Many items contain more than one serving per package. Check the total grams (g) of carbs in one serving. Check the number of grams of saturated fats and trans fats in one serving. Choose foods that have a low amount or none of these fats. Check the number of milligrams (mg) of salt (sodium) in one serving. Most people should limit total sodium intake to less than 2,300 mg per day. Always check the nutrition information of foods labeled as "low-fat" or "nonfat." These foods may be higher in added sugar or refined carbs and should be avoided. Talk to your dietitian to identify your daily goals for nutrients listed on the label. Shopping Avoid buying canned, pre-made, or processed foods. These foods tend to be high in fat, sodium, and added sugar. Shop around the outside edge of the grocery store. This is where you will most often find fresh fruits and vegetables, bulk grains, fresh meats, and fresh dairy products. Cooking Use low-heat cooking methods, such as baking, instead of high-heat cooking methods, such as deep frying. Cook using healthy oils, such as olive, canola, or sunflower oil. Avoid cooking with butter, cream, or high-fat meats. Meal planning Eat meals and snacks regularly, preferably at the same times every day. Avoid going long periods of time without eating. Eat foods  that are high in fiber, such as fresh fruits, vegetables, beans, and whole grains. Eat 4-6 oz (112-168 g) of lean protein each day, such as lean meat, chicken, fish, eggs, or tofu. One ounce (oz) (28 g) of lean protein is equal to: 1 oz (28 g) of meat, chicken, or fish. 1 egg.  cup (62 g) of tofu. Eat some foods each day that contain healthy fats, such as avocado, nuts, seeds, and fish. What foods should I eat? Fruits Berries. Apples. Oranges. Peaches. Apricots. Plums. Grapes. Mangoes. Papayas. Pomegranates. Kiwi. Cherries. Vegetables Leafy greens, including lettuce, spinach, kale, chard, collard greens, mustard greens, and cabbage. Beets. Cauliflower. Broccoli. Carrots. Brister beans. Tomatoes. Peppers. Onions. Cucumbers. Brussels sprouts. Grains Whole grains, such as whole-wheat or whole-grain bread, crackers, tortillas, cereal, and pasta. Unsweetened oatmeal. Quinoa. Brown or wild rice. Meats and other proteins Seafood. Poultry without skin. Lean cuts of poultry and beef. Tofu. Nuts. Seeds. Dairy Low-fat or fat-free dairy products such as milk, yogurt, and cheese. The items listed above may not be a complete list of foods and beverages you can eat and drink. Contact a dietitian for more information. What foods should I avoid? Fruits Fruits canned with syrup. Vegetables Canned vegetables. Frozen vegetables with butter or cream sauce. Grains Refined white flour and flour products such as bread, pasta, snack foods, and cereals. Avoid all processed foods. Meats and other proteins Fatty cuts of meat. Poultry with skin. Breaded or fried meats. Processed meat. Avoid saturated fats. Dairy Full-fat yogurt, cheese, or milk. Beverages Sweetened drinks, such as soda or iced tea. The items listed above may not be a complete list of foods and beverages you should avoid. Contact a dietitian for more information. Questions to ask a health care provider Do I need to meet with a certified diabetes  care and education specialist? Do I need to  meet with a dietitian? What number can I call if I have questions? When are the best times to check my blood glucose? Where to find more information: American Diabetes Association: diabetes.org Academy of Nutrition and Dietetics: eatright.[employer] of Diabetes and Digestive and Kidney Diseases: StageSync.si Association of Diabetes Care & Education Specialists: diabeteseducator.org Summary It is important to have healthy eating habits because your blood sugar (glucose) levels are greatly affected by what you eat and drink. It is important to use alcohol carefully. A healthy meal plan will help you manage your blood glucose and lower your risk of heart disease. Your health care provider may recommend that you work with a dietitian to make a meal plan that is best for you. This information is not intended to replace advice given to you by your health care provider. Make sure you discuss any questions you have with your health care provider. Document Revised: 06/05/2020 Document Reviewed: 06/06/2020 Elsevier Patient Education  2024 Elsevier Inc.   Maryagnes Small, MD [facility]

## 2024-03-07 NOTE — Addendum Note (Signed)
 Addended by: Arch Beans, Orby Tangen P on: 03/07/2024 04:15 PM   Modules accepted: Orders

## 2024-03-07 NOTE — Assessment & Plan Note (Addendum)
 Still struggling with both depression and anxiety Continue Celexa  10 mg daily Needs psychiatric evaluation. Referral placed today.

## 2024-03-07 NOTE — Assessment & Plan Note (Addendum)
 BP Readings from Last 3 Encounters:  03/07/24 138/86  01/20/24 (!) 123/92  01/20/24 (!) 140/90  Well-controlled hypertension Continue amlodipine  10 mg daily Glucose today 283.  Better than before Continue weekly Trulicity  1.5 mg and daily Jardiance  10 mg Continue daily glipizide  5 mg twice a day and metformin  1000 mg twice a day Cardiovascular risks associated with uncontrolled diabetes discussed Diet and nutrition discussed Follow-up in 2 months

## 2024-03-07 NOTE — Patient Instructions (Signed)

## 2024-03-30 ENCOUNTER — Telehealth: Payer: Self-pay | Admitting: Pharmacist

## 2024-03-30 NOTE — Telephone Encounter (Signed)
 Patient was identified as falling into the [place] Measure - Diabetes.   Attempted to contact patient to follow up after PCP follow up a few weeks ago where he was to restart Jardiance  and Trulicity .  No answer, left voicemail with direct call back number.  Rainelle Bur, PharmD, BCPS, CPP Clinical Pharmacist Practitioner [facility] [phone]

## 2024-04-22 NOTE — Telephone Encounter (Signed)
 Patient was identified as falling into the [place] Measure - Diabetes.   Attempted to contact patient to follow up after PCP follow up a few weeks ago where he was to restart Jardiance  and Trulicity .  No answer, left voicemail with direct call back number.  Rainelle Bur, PharmD, BCPS, CPP Clinical Pharmacist Practitioner [facility] [phone]

## 2024-05-04 ENCOUNTER — Encounter (HOSPITAL_COMMUNITY): Payer: Self-pay

## 2024-05-04 ENCOUNTER — Other Ambulatory Visit: Payer: Self-pay

## 2024-05-04 ENCOUNTER — Emergency Department (HOSPITAL_COMMUNITY)
Admission: EM | Admit: 2024-05-04 | Discharge: 2024-05-04 | Disposition: A | Discharge status: Home / Self Care | Attending: Student | Admitting: Student

## 2024-05-04 ENCOUNTER — Ambulatory Visit (HOSPITAL_COMMUNITY): Admission: EM | Admit: 2024-05-04 | Discharge: 2024-05-04 | Disposition: A | Discharge status: Home / Self Care

## 2024-05-04 DIAGNOSIS — E119 Type 2 diabetes mellitus without complications: Secondary | ICD-10-CM | POA: Insufficient documentation

## 2024-05-04 DIAGNOSIS — Z79899 Other long term (current) drug therapy: Secondary | ICD-10-CM | POA: Diagnosis not present

## 2024-05-04 DIAGNOSIS — R1013 Epigastric pain: Secondary | ICD-10-CM | POA: Diagnosis not present

## 2024-05-04 DIAGNOSIS — F17210 Nicotine dependence, cigarettes, uncomplicated: Secondary | ICD-10-CM | POA: Diagnosis not present

## 2024-05-04 DIAGNOSIS — I10 Essential (primary) hypertension: Secondary | ICD-10-CM | POA: Diagnosis not present

## 2024-05-04 DIAGNOSIS — Z7984 Long term (current) use of oral hypoglycemic drugs: Secondary | ICD-10-CM | POA: Diagnosis not present

## 2024-05-04 DIAGNOSIS — R42 Dizziness and giddiness: Secondary | ICD-10-CM | POA: Insufficient documentation

## 2024-05-04 DIAGNOSIS — Z7982 Long term (current) use of aspirin: Secondary | ICD-10-CM | POA: Insufficient documentation

## 2024-05-04 DIAGNOSIS — M546 Pain in thoracic spine: Secondary | ICD-10-CM

## 2024-05-04 LAB — CBC WITH DIFFERENTIAL/PLATELET
Abs Immature Granulocytes: 0.01 10*3/uL (ref 0.00–0.07)
Basophils Absolute: 0 10*3/uL (ref 0.0–0.1)
Basophils Relative: 1 %
Eosinophils Absolute: 0.1 10*3/uL (ref 0.0–0.5)
Eosinophils Relative: 1 %
HCT: 38.7 % — ABNORMAL LOW (ref 39.0–52.0)
Hemoglobin: 12.7 g/dL — ABNORMAL LOW (ref 13.0–17.0)
Immature Granulocytes: 0 %
Lymphocytes Relative: 33 %
Lymphs Abs: 2 10*3/uL (ref 0.7–4.0)
MCH: 29.5 pg (ref 26.0–34.0)
MCHC: 32.8 g/dL (ref 30.0–36.0)
MCV: 90 fL (ref 80.0–100.0)
Monocytes Absolute: 0.6 10*3/uL (ref 0.1–1.0)
Monocytes Relative: 10 %
Neutro Abs: 3.4 10*3/uL (ref 1.7–7.7)
Neutrophils Relative %: 55 %
Platelets: 254 10*3/uL (ref 150–400)
RBC: 4.3 MIL/uL (ref 4.22–5.81)
RDW: 13.5 % (ref 11.5–15.5)
WBC: 6 10*3/uL (ref 4.0–10.5)
nRBC: 0 % (ref 0.0–0.2)

## 2024-05-04 LAB — COMPREHENSIVE METABOLIC PANEL WITH GFR
ALT: 17 U/L (ref 0–44)
AST: 18 U/L (ref 15–41)
Albumin: 4 g/dL (ref 3.5–5.0)
Alkaline Phosphatase: 62 U/L (ref 38–126)
Anion gap: 11 (ref 5–15)
BUN: 31 mg/dL — ABNORMAL HIGH (ref 6–20)
CO2: 24 mmol/L (ref 22–32)
Calcium: 9.2 mg/dL (ref 8.9–10.3)
Chloride: 103 mmol/L (ref 98–111)
Creatinine, Ser: 2.37 mg/dL — ABNORMAL HIGH (ref 0.61–1.24)
GFR, Estimated: 33 mL/min — ABNORMAL LOW (ref >60)
Glucose, Bld: 155 mg/dL — ABNORMAL HIGH (ref 70–99)
Potassium: 3.7 mmol/L (ref 3.5–5.1)
Sodium: 138 mmol/L (ref 135–145)
Total Bilirubin: 0.8 mg/dL (ref 0.0–1.2)
Total Protein: 7.6 g/dL (ref 6.5–8.1)

## 2024-05-04 LAB — TROPONIN I (HIGH SENSITIVITY)
Troponin I (High Sensitivity): 21 ng/L — ABNORMAL HIGH (ref <18)
Troponin I (High Sensitivity): 23 ng/L — ABNORMAL HIGH (ref <18)

## 2024-05-04 LAB — CBG MONITORING, ED: Glucose-Capillary: 146 mg/dL — ABNORMAL HIGH (ref 70–99)

## 2024-05-04 LAB — LIPASE, BLOOD: Lipase: 80 U/L — ABNORMAL HIGH (ref 11–51)

## 2024-05-04 MED ORDER — PANTOPRAZOLE SODIUM 20 MG PO TBEC: 20.0000 mg | DELAYED_RELEASE_TABLET | Freq: Every day | ORAL | 0 refills | Status: AC

## 2024-05-04 MED ORDER — LIDOCAINE VISCOUS HCL 2 % MT SOLN
15.0000 mL | Freq: Once | OROMUCOSAL | Status: AC
  Administered 2024-05-04: 15 mL via ORAL
  Filled 2024-05-04: qty 15

## 2024-05-04 MED ORDER — FAMOTIDINE 20 MG PO TABS: 20.0000 mg | ORAL_TABLET | Freq: Two times a day (BID) | ORAL | 0 refills | Status: AC | PRN

## 2024-05-04 MED ORDER — LACTATED RINGERS IV BOLUS
1000.0000 mL | Freq: Once | INTRAVENOUS | Status: AC
  Administered 2024-05-04: 1000 mL via INTRAVENOUS

## 2024-05-04 MED ORDER — ALUM & MAG HYDROXIDE-SIMETH 200-200-20 MG/5ML PO SUSP
30.0000 mL | Freq: Once | ORAL | Status: AC
  Administered 2024-05-04: 30 mL via ORAL
  Filled 2024-05-04: qty 30

## 2024-05-04 NOTE — ED Provider Notes (Signed)
 Patient with significant past medical history reports to UC with thoracic back pain, vomiting, epigastric pain that started 2 weeks ago. He reports he gets dizzy/ lightheaded when he is lifting heavy things at work. He notes this is similar to presentation earlier in the year and he feels as if his pancreas is inflamed. Recommended further evaluation in the ED for labs and imaging. Patient is agreeable, and family member  with him will transport via POV next door.    Vernestine Gondola, PA-C 05/04/24 [phone]

## 2024-05-04 NOTE — ED Triage Notes (Signed)
 Pt. Stated, Im lightheaded and dizzy for about 2 weeks. Also having N/V depending on what I eat.

## 2024-05-04 NOTE — ED Triage Notes (Signed)
 Patient presents to office for lower back pain, vomiting and dizziness x 2 weeks. Patient states vomiting happens after he eats.

## 2024-05-04 NOTE — ED Notes (Signed)
 Pt provided with food and drink for po challenge

## 2024-05-04 NOTE — ED Provider Notes (Signed)
 [place] [facility] Provider Note  CSN: [id number] Arrival date & time: 05/04/24 8295  Chief Complaint(s) Dizziness, Abdominal Pain, Emesis, and Nausea  HPI Paul Roberson is a 53 y.o. male with PMH T2DM, HTN, pancreatitis who presents emerged part for evaluation of abdominal pain, nausea, vomiting and lightheadedness.  States that symptoms have been gradually worsening over the last 2 weeks.  States that he feels like his pancreas is inflamed and went to urgent care who transferred him to the ER for laboratory evaluation.  Patient states he has been compliant with his diabetic medication.  Here in the ER he states that his abdominal pain is more in epigastric cramping type pain and is getting in the way of him tolerating p.o.  He denies current chest pain, shortness of breath, headache, numbness, tingling, weakness or other systemic or neurologic complaints.   Past Medical History Past Medical History:  Diagnosis Date   Diabetes mellitus without complication (HCC)    Hypertension    Patient Active Problem List   Diagnosis Date Noted   Adjustment reaction with anxiety and depression 03/07/2024   Acute kidney injury superimposed on chronic kidney disease (HCC) 01/13/2024   Spinal stenosis of lumbar region 05/12/2023   Moderate episode of recurrent major depressive disorder (HCC) 04/07/2023   Current smoker 07/01/2022   Chronic anemia 05/27/2022   Hypertension associated with type 2 diabetes mellitus (HCC) 05/28/2017   Type 2 diabetes mellitus without complication, without long-term current use of insulin  (HCC) 05/28/2017   Home Medication(s) Prior to Admission medications   Medication Sig Start Date End Date Taking? Authorizing Provider  famotidine (PEPCID) 20 MG tablet Take 1 tablet (20 mg total) by mouth 2 (two) times daily as needed for heartburn or indigestion. 05/04/24  Yes Almedia Cordell, MD  pantoprazole (PROTONIX) 20 MG tablet Take 1  tablet (20 mg total) by mouth daily. 05/04/24 06/03/24 Yes Katesha Eichel, MD  amLODipine  (NORVASC ) 10 MG tablet Take 1 tablet (10 mg total) by mouth daily. 01/14/24   Wynetta Heckle, MD  Aspirin-Salicylamide-Caffeine (BC HEADACHE POWDER PO) Take 1 packet by mouth 2 (two) times daily as needed (pain/headache).    [provider]  citalopram  (CELEXA ) 10 MG tablet Take 1 tablet (10 mg total) by mouth daily. 04/07/23   Elvira Hammersmith, MD  Continuous Glucose Receiver (FREESTYLE LIBRE 2 READER) DEVI Dose,Route,Frequency: As Directed 01/14/24   Wynetta Heckle, MD  Continuous Glucose Sensor (FREESTYLE LIBRE 2 SENSOR) MISC Dose, Route Frequency: As Directed 01/14/24   Wynetta Heckle, MD  cyclobenzaprine  (FLEXERIL ) 10 MG tablet Take 1 tablet (10 mg total) by mouth 2 (two) times daily as needed for muscle spasms. 05/05/23   Schutt, Coni Deep, PA-C  Dulaglutide  (TRULICITY ) 1.5 MG/0.5ML SOAJ Inject 1.5 mg into the skin once a week. 01/20/24   Sagardia, Miguel Jose, MD  empagliflozin  (JARDIANCE ) 10 MG TABS tablet Take 1 tablet (10 mg total) by mouth daily. 01/20/24   Elvira Hammersmith, MD  glipiZIDE  (GLUCOTROL ) 5 MG tablet Take 1 tablet (5 mg total) by mouth every morning. 01/14/24   Wynetta Heckle, MD  losartan -hydrochlorothiazide (HYZAAR) 50-12.5 MG tablet Take 1 tablet by mouth daily. 01/14/24   Wynetta Heckle, MD  metFORMIN  (GLUCOPHAGE ) 1000 MG tablet Take 1 tablet (1,000 mg total) by mouth 2 (two) times daily with a meal. 01/14/24   Wynetta Heckle, MD  rosuvastatin  (CRESTOR ) 5 MG tablet Take 1 tablet (5 mg total) by mouth at bedtime. 01/14/24 01/08/25  Pfeiffer,  Bufford Carne, MD                                                                                                                                    Past Surgical History No past surgical history on file. Family History Family History  Problem Relation Age of Onset   Diabetes Mother    Heart disease Mother    Hypertension Mother    Asthma  Mother    Diabetes Father    Heart disease Father    Hypertension Father    Kidney disease Father    Cancer Father     Social History Social History   Tobacco Use   Smoking status: Some Days    Types: Cigarettes   Smokeless tobacco: Never  Vaping Use   Vaping status: Never Used  Substance Use Topics   Alcohol use: Not Currently   Drug use: No   Allergies Sulfa antibiotics and Penicillins  Review of Systems Review of Systems  Gastrointestinal:  Positive for abdominal pain and nausea.  Neurological:  Positive for light-headedness.    Physical Exam Vital Signs  I have reviewed the triage vital signs BP 129/84   Pulse 87   Temp 97.9 F (36.6 C) (Oral)   Resp (!) 23   Ht 5' 10 (1.778 m)   Wt 99.3 kg   SpO2 99%   BMI 31.42 kg/m   Physical Exam Constitutional:      General: He is not in acute distress.    Appearance: Normal appearance.  HENT:     Head: Normocephalic and atraumatic.     Nose: No congestion or rhinorrhea.   Eyes:     General:        Right eye: No discharge.        Left eye: No discharge.     Extraocular Movements: Extraocular movements intact.     Pupils: Pupils are equal, round, and reactive to light.    Cardiovascular:     Rate and Rhythm: Normal rate and regular rhythm.     Heart sounds: No murmur heard. Pulmonary:     Effort: No respiratory distress.     Breath sounds: No wheezing or rales.  Abdominal:     General: There is no distension.     Tenderness: There is no abdominal tenderness.   Musculoskeletal:        General: Normal range of motion.     Cervical back: Normal range of motion.   Skin:    General: Skin is warm and dry.   Neurological:     General: No focal deficit present.     Mental Status: He is alert.     ED Results and Treatments Labs (all labs ordered are listed, but only abnormal results are displayed) Labs Reviewed  COMPREHENSIVE METABOLIC PANEL WITH GFR - Abnormal; Notable for the following  components:      Result Value   Glucose, Bld  155 (*)    BUN 31 (*)    Creatinine, Ser 2.37 (*)    GFR, Estimated 33 (*)    All other components within normal limits  CBC WITH DIFFERENTIAL/PLATELET - Abnormal; Notable for the following components:   Hemoglobin 12.7 (*)    HCT 38.7 (*)    All other components within normal limits  LIPASE, BLOOD - Abnormal; Notable for the following components:   Lipase 80 (*)    All other components within normal limits  CBG MONITORING, ED - Abnormal; Notable for the following components:   Glucose-Capillary 146 (*)    All other components within normal limits  TROPONIN I (HIGH SENSITIVITY) - Abnormal; Notable for the following components:   Troponin I (High Sensitivity) 21 (*)    All other components within normal limits  TROPONIN I (HIGH SENSITIVITY) - Abnormal; Notable for the following components:   Troponin I (High Sensitivity) 23 (*)    All other components within normal limits  URINALYSIS, ROUTINE W REFLEX MICROSCOPIC                                                                                                                          Radiology No results found.  Pertinent labs & imaging results that were available during my care of the patient were reviewed by me and considered in my medical decision making (see MDM for details).  Medications Ordered in ED Medications  lactated ringers  bolus 1,000 mL (0 mLs Intravenous Stopped 05/04/24 1138)  alum & mag hydroxide-simeth (MAALOX/MYLANTA) 200-200-20 MG/5ML suspension 30 mL (30 mLs Oral Given 05/04/24 1028)    And  lidocaine (XYLOCAINE) 2 % viscous mouth solution 15 mL (15 mLs Oral Given 05/04/24 1029)                                                                                                                                     Procedures Procedures  (including critical care time)  Medical Decision Making / ED Course   This patient presents to the ED for concern of abdominal pain,  lightheadedness, this involves an extensive number of treatment options, and is a complaint that carries with it a high risk of complications and morbidity.  The differential diagnosis includes GERD/gastritis, peptic ulcer disease, pancreatitis, gastroparesis, pneumonia, pleurisy, pericarditis  MDM: Patient seen emergency room for evaluation of abdominal pain and lightheadedness.  Physical exam is  largely unremarkable outside of some very mild tenderness in the epigastrium.  Otherwise soft and benign with no rebound.  Laboratory evaluation with hemoglobin 12.7, BUN 31, creatinine 2.37 which is patient's baseline, sugars are normal at 155.  Lipase is 80 but this appears to be around patient's baseline and was significantly higher when he had an episode of acute pancreatitis in the past.  High-sensitivity troponin 21 and delta troponin 23.  Patient fluid resuscitated and given the GI cocktail and on reevaluation his symptoms have improved.  He is able to tolerate p.o. without difficulty.  Have overall fairly low suspicion for pancreatitis today given benign abdominal exam and significant improvement after GERD specific medications.  Thus CT imaging deferred.  Suspect patient likely had decreased p.o. intake in the setting of gastritis causing lightheadedness.  Was given strict return precautions of which she voiced understanding but at this time he does not meet inpatient criteria for admission and will be discharged with outpatient follow-up.   Additional history obtained: -Additional history obtained from family ember -External records from outside source obtained and reviewed including: Chart review including previous notes, labs, imaging, consultation notes   Lab Tests: -I ordered, reviewed, and interpreted labs.   The pertinent results include:   Labs Reviewed  COMPREHENSIVE METABOLIC PANEL WITH GFR - Abnormal; Notable for the following components:      Result Value   Glucose, Bld 155 (*)    BUN  31 (*)    Creatinine, Ser 2.37 (*)    GFR, Estimated 33 (*)    All other components within normal limits  CBC WITH DIFFERENTIAL/PLATELET - Abnormal; Notable for the following components:   Hemoglobin 12.7 (*)    HCT 38.7 (*)    All other components within normal limits  LIPASE, BLOOD - Abnormal; Notable for the following components:   Lipase 80 (*)    All other components within normal limits  CBG MONITORING, ED - Abnormal; Notable for the following components:   Glucose-Capillary 146 (*)    All other components within normal limits  TROPONIN I (HIGH SENSITIVITY) - Abnormal; Notable for the following components:   Troponin I (High Sensitivity) 21 (*)    All other components within normal limits  TROPONIN I (HIGH SENSITIVITY) - Abnormal; Notable for the following components:   Troponin I (High Sensitivity) 23 (*)    All other components within normal limits  URINALYSIS, ROUTINE W REFLEX MICROSCOPIC      EKG   EKG Interpretation Date/Time:  Wednesday May 04 2024 09:44:26 EDT Ventricular Rate:  104 PR Interval:  138 QRS Duration:  102 QT Interval:  362 QTC Calculation: 476 R Axis:   100  Text Interpretation: Sinus tachycardia Right atrial enlargement Rightward axis ST & T wave abnormality, consider inferolateral ischemia Abnormal ECG When compared with ECG of 13-Jan-2024 15:39, PREVIOUS ECG IS PRESENT Confirmed by Billy Turvey ([id number]) on 05/04/2024 10:13:00 AM          Medicines ordered and prescription drug management: Meds ordered this encounter  Medications   lactated ringers  bolus 1,000 mL   AND Linked Order Group    alum & mag hydroxide-simeth (MAALOX/MYLANTA) 200-200-20 MG/5ML suspension 30 mL    lidocaine (XYLOCAINE) 2 % viscous mouth solution 15 mL   pantoprazole (PROTONIX) 20 MG tablet    Sig: Take 1 tablet (20 mg total) by mouth daily.    Dispense:  30 tablet    Refill:  0   famotidine (PEPCID) 20 MG tablet  Sig: Take 1 tablet (20 mg total) by mouth 2  (two) times daily as needed for heartburn or indigestion.    Dispense:  30 tablet    Refill:  0    -I have reviewed the patients home medicines and have made adjustments as needed  Critical interventions none   Cardiac Monitoring: The patient was maintained on a cardiac monitor.  I personally viewed and interpreted the cardiac monitored which showed an underlying rhythm of: NSR  Social Determinants of Health:  Factors impacting patients care include: none   Reevaluation: After the interventions noted above, I reevaluated the patient and found that they have :improved  Co morbidities that complicate the patient evaluation  Past Medical History:  Diagnosis Date   Diabetes mellitus without complication (HCC)    Hypertension       Dispostion: I considered admission for this patient, but at this time he does not meet inpatient criteria for admission will be discharged with outpatient follow-up     Final Clinical Impression(s) / ED Diagnoses Final diagnoses:  Epigastric pain  Lightheadedness     @PCDICTATION @    Aliana Kreischer, Alyse July, MD 05/04/24 1355

## 2024-05-06 ENCOUNTER — Encounter (HOSPITAL_COMMUNITY): Payer: Self-pay

## 2024-05-06 ENCOUNTER — Emergency Department (HOSPITAL_COMMUNITY)
Admission: EM | Admit: 2024-05-06 | Discharge: 2024-05-07 | Disposition: A | Discharge status: Home / Self Care | Attending: Emergency Medicine | Admitting: Emergency Medicine

## 2024-05-06 DIAGNOSIS — S29012A Strain of muscle and tendon of back wall of thorax, initial encounter: Secondary | ICD-10-CM | POA: Diagnosis not present

## 2024-05-06 DIAGNOSIS — S39011A Strain of muscle, fascia and tendon of abdomen, initial encounter: Secondary | ICD-10-CM | POA: Diagnosis not present

## 2024-05-06 DIAGNOSIS — M549 Dorsalgia, unspecified: Secondary | ICD-10-CM | POA: Diagnosis not present

## 2024-05-06 DIAGNOSIS — F17200 Nicotine dependence, unspecified, uncomplicated: Secondary | ICD-10-CM | POA: Insufficient documentation

## 2024-05-06 DIAGNOSIS — E1122 Type 2 diabetes mellitus with diabetic chronic kidney disease: Secondary | ICD-10-CM | POA: Insufficient documentation

## 2024-05-06 DIAGNOSIS — X58XXXA Exposure to other specified factors, initial encounter: Secondary | ICD-10-CM | POA: Insufficient documentation

## 2024-05-06 DIAGNOSIS — S29002A Unspecified injury of muscle and tendon of back wall of thorax, initial encounter: Secondary | ICD-10-CM | POA: Diagnosis not present

## 2024-05-06 DIAGNOSIS — Z7984 Long term (current) use of oral hypoglycemic drugs: Secondary | ICD-10-CM | POA: Diagnosis not present

## 2024-05-06 DIAGNOSIS — R1012 Left upper quadrant pain: Secondary | ICD-10-CM | POA: Insufficient documentation

## 2024-05-06 DIAGNOSIS — I129 Hypertensive chronic kidney disease with stage 1 through stage 4 chronic kidney disease, or unspecified chronic kidney disease: Secondary | ICD-10-CM | POA: Diagnosis not present

## 2024-05-06 DIAGNOSIS — D631 Anemia in chronic kidney disease: Secondary | ICD-10-CM | POA: Insufficient documentation

## 2024-05-06 DIAGNOSIS — R1010 Upper abdominal pain, unspecified: Secondary | ICD-10-CM

## 2024-05-06 DIAGNOSIS — Z79899 Other long term (current) drug therapy: Secondary | ICD-10-CM | POA: Insufficient documentation

## 2024-05-06 DIAGNOSIS — S29019A Strain of muscle and tendon of unspecified wall of thorax, initial encounter: Secondary | ICD-10-CM

## 2024-05-06 DIAGNOSIS — N189 Chronic kidney disease, unspecified: Secondary | ICD-10-CM | POA: Diagnosis not present

## 2024-05-06 MED ORDER — METOCLOPRAMIDE HCL 5 MG/ML IJ SOLN
5.0000 mg | Freq: Once | INTRAMUSCULAR | Status: AC
  Administered 2024-05-07: 5 mg via INTRAVENOUS
  Filled 2024-05-06: qty 2

## 2024-05-06 MED ORDER — ALUM & MAG HYDROXIDE-SIMETH 200-200-20 MG/5ML PO SUSP
30.0000 mL | Freq: Once | ORAL | Status: AC
  Administered 2024-05-07: 30 mL via ORAL
  Filled 2024-05-06: qty 30

## 2024-05-06 MED ORDER — LIDOCAINE VISCOUS HCL 2 % MT SOLN
15.0000 mL | Freq: Once | OROMUCOSAL | Status: AC
  Administered 2024-05-07: 15 mL via ORAL
  Filled 2024-05-06: qty 15

## 2024-05-06 NOTE — ED Triage Notes (Signed)
 Pt is coming in for mid upper back pain, this is a issue he has been seen for recently and was given some medications and fluids in the hospital which allowed the pain to subside. He is coming in with the same issue today. He has taken the GERD medication and some tylenol  today which has not helped with the pain. He is otherwise stable at this time.

## 2024-05-06 NOTE — ED Provider Notes (Signed)
 [place] [facility] AT [facility] Provider Note  CSN: [id number] Arrival date & time: 05/06/24 2047  Chief Complaint(s) Back Pain  HPI Paul Roberson is a 53 y.o. male {Add pertinent medical, surgical, social history, OB history to HPI:1}    Back Pain   Past Medical History Past Medical History:  Diagnosis Date   Diabetes mellitus without complication (HCC)    Hypertension    Patient Active Problem List   Diagnosis Date Noted   Adjustment reaction with anxiety and depression 03/07/2024   Acute kidney injury superimposed on chronic kidney disease (HCC) 01/13/2024   Spinal stenosis of lumbar region 05/12/2023   Moderate episode of recurrent major depressive disorder (HCC) 04/07/2023   Current smoker 07/01/2022   Chronic anemia 05/27/2022   Hypertension associated with type 2 diabetes mellitus (HCC) 05/28/2017   Type 2 diabetes mellitus without complication, without long-term current use of insulin  (HCC) 05/28/2017   Home Medication(s) Prior to Admission medications   Medication Sig Start Date End Date Taking? Authorizing Provider  amLODipine  (NORVASC ) 10 MG tablet Take 1 tablet (10 mg total) by mouth daily. 01/14/24   Armenta Canning, MD  Aspirin-Salicylamide-Caffeine (BC HEADACHE POWDER PO) Take 1 packet by mouth 2 (two) times daily as needed (pain/headache).    [provider]  citalopram  (CELEXA ) 10 MG tablet Take 1 tablet (10 mg total) by mouth daily. 04/07/23   Purcell Emil Schanz, MD  Continuous Glucose Receiver (FREESTYLE LIBRE 2 READER) DEVI Dose,Route,Frequency: As Directed 01/14/24   Armenta Canning, MD  Continuous Glucose Sensor (FREESTYLE LIBRE 2 SENSOR) MISC Dose, Route Frequency: As Directed 01/14/24   Armenta Canning, MD  cyclobenzaprine  (FLEXERIL ) 10 MG tablet Take 1 tablet (10 mg total) by mouth 2 (two) times daily as needed for muscle spasms. 05/05/23   Schutt, Marsa HERO, PA-C  Dulaglutide  (TRULICITY ) 1.5 MG/0.5ML SOAJ Inject 1.5  mg into the skin once a week. 01/20/24   Purcell Emil Schanz, MD  empagliflozin  (JARDIANCE ) 10 MG TABS tablet Take 1 tablet (10 mg total) by mouth daily. 01/20/24   Purcell Emil Schanz, MD  famotidine (PEPCID) 20 MG tablet Take 1 tablet (20 mg total) by mouth 2 (two) times daily as needed for heartburn or indigestion. 05/04/24   Kommor, Madison, MD  glipiZIDE  (GLUCOTROL ) 5 MG tablet Take 1 tablet (5 mg total) by mouth every morning. 01/14/24   Armenta Canning, MD  losartan -hydrochlorothiazide (HYZAAR) 50-12.5 MG tablet Take 1 tablet by mouth daily. 01/14/24   Armenta Canning, MD  metFORMIN  (GLUCOPHAGE ) 1000 MG tablet Take 1 tablet (1,000 mg total) by mouth 2 (two) times daily with a meal. 01/14/24   Armenta Canning, MD  pantoprazole (PROTONIX) 20 MG tablet Take 1 tablet (20 mg total) by mouth daily. 05/04/24 06/03/24  Kommor, Madison, MD  rosuvastatin  (CRESTOR ) 5 MG tablet Take 1 tablet (5 mg total) by mouth at bedtime. 01/14/24 01/08/25  Armenta Canning, MD  Allergies Sulfa antibiotics and Penicillins  Review of Systems Review of Systems  Musculoskeletal:  Positive for back pain.   As noted in HPI  Physical Exam Vital Signs  I have reviewed the triage vital signs BP (!) 144/93 (BP Location: Left Arm)   Pulse 84   Temp 98 F (36.7 C) (Oral)   Resp 20   SpO2 97%  *** Physical Exam  ED Results and Treatments Labs (all labs ordered are listed, but only abnormal results are displayed) Labs Reviewed - No data to display                                                                                                                       EKG  EKG Interpretation Date/Time:    Ventricular Rate:    PR Interval:    QRS Duration:    QT Interval:    QTC Calculation:   R Axis:      Text Interpretation:         Radiology No results found.  Medications Ordered  in ED Medications - No data to display Procedures Procedures  (including critical care time) Medical Decision Making / ED Course   Medical Decision Making Amount and/or Complexity of Data Reviewed Labs: ordered. Radiology: ordered.  Risk OTC drugs. Prescription drug management.    ***    Final Clinical Impression(s) / ED Diagnoses Final diagnoses:  None    This chart was dictated using voice recognition software.  Despite best efforts to proofread,  errors can occur which can change the documentation meaning.

## 2024-05-07 ENCOUNTER — Emergency Department (HOSPITAL_COMMUNITY)

## 2024-05-07 DIAGNOSIS — M549 Dorsalgia, unspecified: Secondary | ICD-10-CM | POA: Diagnosis not present

## 2024-05-07 LAB — CBC WITH DIFFERENTIAL/PLATELET
Abs Immature Granulocytes: 0.02 10*3/uL (ref 0.00–0.07)
Basophils Absolute: 0 10*3/uL (ref 0.0–0.1)
Basophils Relative: 1 %
Eosinophils Absolute: 0.1 10*3/uL (ref 0.0–0.5)
Eosinophils Relative: 2 %
HCT: 40.3 % (ref 39.0–52.0)
Hemoglobin: 12.8 g/dL — ABNORMAL LOW (ref 13.0–17.0)
Immature Granulocytes: 0 %
Lymphocytes Relative: 31 %
Lymphs Abs: 1.7 10*3/uL (ref 0.7–4.0)
MCH: 28.8 pg (ref 26.0–34.0)
MCHC: 31.8 g/dL (ref 30.0–36.0)
MCV: 90.8 fL (ref 80.0–100.0)
Monocytes Absolute: 0.4 10*3/uL (ref 0.1–1.0)
Monocytes Relative: 8 %
Neutro Abs: 3.1 10*3/uL (ref 1.7–7.7)
Neutrophils Relative %: 58 %
Platelets: 249 10*3/uL (ref 150–400)
RBC: 4.44 MIL/uL (ref 4.22–5.81)
RDW: 13.2 % (ref 11.5–15.5)
WBC: 5.3 10*3/uL (ref 4.0–10.5)
nRBC: 0 % (ref 0.0–0.2)

## 2024-05-07 LAB — COMPREHENSIVE METABOLIC PANEL WITH GFR
ALT: 17 U/L (ref 0–44)
AST: 18 U/L (ref 15–41)
Albumin: 4 g/dL (ref 3.5–5.0)
Alkaline Phosphatase: 71 U/L (ref 38–126)
Anion gap: 10 (ref 5–15)
BUN: 29 mg/dL — ABNORMAL HIGH (ref 6–20)
CO2: 26 mmol/L (ref 22–32)
Calcium: 9.6 mg/dL (ref 8.9–10.3)
Chloride: 103 mmol/L (ref 98–111)
Creatinine, Ser: 1.93 mg/dL — ABNORMAL HIGH (ref 0.61–1.24)
GFR, Estimated: 42 mL/min — ABNORMAL LOW (ref >60)
Glucose, Bld: 238 mg/dL — ABNORMAL HIGH (ref 70–99)
Potassium: 4.1 mmol/L (ref 3.5–5.1)
Sodium: 139 mmol/L (ref 135–145)
Total Bilirubin: 0.7 mg/dL (ref 0.0–1.2)
Total Protein: 8.2 g/dL — ABNORMAL HIGH (ref 6.5–8.1)

## 2024-05-07 LAB — BLOOD GAS, VENOUS
Acid-Base Excess: 1.1 mmol/L (ref 0.0–2.0)
Bicarbonate: 28.3 mmol/L — ABNORMAL HIGH (ref 20.0–28.0)
O2 Saturation: 42.9 %
Patient temperature: 37
pCO2, Ven: 55 mmHg (ref 44–60)
pH, Ven: 7.32 (ref 7.25–7.43)
pO2, Ven: <31 mmHg — CL (ref 32–45)

## 2024-05-07 LAB — URINALYSIS, ROUTINE W REFLEX MICROSCOPIC
Bacteria, UA: NONE SEEN
Bilirubin Urine: NEGATIVE
Glucose, UA: >=500 mg/dL — AB
Hgb urine dipstick: NEGATIVE
Ketones, ur: NEGATIVE mg/dL
Leukocytes,Ua: NEGATIVE
Nitrite: NEGATIVE
Protein, ur: NEGATIVE mg/dL
Specific Gravity, Urine: 1.027 (ref 1.005–1.030)
pH: 5 (ref 5.0–8.0)

## 2024-05-07 LAB — LIPASE, BLOOD: Lipase: 121 U/L — ABNORMAL HIGH (ref 11–51)

## 2024-05-07 LAB — CBG MONITORING, ED: Glucose-Capillary: 217 mg/dL — ABNORMAL HIGH (ref 70–99)

## 2024-05-07 MED ORDER — ONDANSETRON 4 MG PO TBDP: 4.0000 mg | ORAL_TABLET | Freq: Three times a day (TID) | ORAL | 0 refills | Status: AC | PRN

## 2024-05-07 MED ORDER — SUCRALFATE 1 GM/10ML PO SUSP
1.0000 g | Freq: Three times a day (TID) | ORAL | 0 refills | Status: AC
Start: 2024-05-07 — End: ?

## 2024-05-07 MED ORDER — KETOROLAC TROMETHAMINE 15 MG/ML IJ SOLN
15.0000 mg | Freq: Once | INTRAMUSCULAR | Status: AC
  Administered 2024-05-07: 15 mg via INTRAVENOUS
  Filled 2024-05-07: qty 1

## 2024-05-07 NOTE — Discharge Instructions (Addendum)
 For pain control you may take 1000 mg of acetaminophen  (Tylenol ) every 8 hours  as needed.  Please limit acetaminophen  (Tylenol ) to 4000 mg. Please note that other over-the-counter medicine may contain acetaminophen  as a component of their ingredients.   You may use over-the-counter topical muscle creams such as SalonPas, [employer], Bengay, etc. Please stretch, apply ice or heat (whichever helps), and have massage therapy for additional assistance.

## 2024-05-09 ENCOUNTER — Ambulatory Visit: Admitting: Emergency Medicine

## 2024-05-09 ENCOUNTER — Encounter: Payer: Self-pay | Admitting: Emergency Medicine

## 2024-05-09 VITALS — BP 126/84 | HR 95 | Temp 98.3°F | Resp 16 | Ht 70.0 in | Wt 215.2 lb

## 2024-05-09 DIAGNOSIS — F4323 Adjustment disorder with mixed anxiety and depressed mood: Secondary | ICD-10-CM | POA: Diagnosis not present

## 2024-05-09 DIAGNOSIS — I152 Hypertension secondary to endocrine disorders: Secondary | ICD-10-CM

## 2024-05-09 DIAGNOSIS — F17200 Nicotine dependence, unspecified, uncomplicated: Secondary | ICD-10-CM | POA: Diagnosis not present

## 2024-05-09 DIAGNOSIS — Z7984 Long term (current) use of oral hypoglycemic drugs: Secondary | ICD-10-CM

## 2024-05-09 DIAGNOSIS — E119 Type 2 diabetes mellitus without complications: Secondary | ICD-10-CM

## 2024-05-09 DIAGNOSIS — Z7985 Long-term (current) use of injectable non-insulin antidiabetic drugs: Secondary | ICD-10-CM

## 2024-05-09 DIAGNOSIS — E1159 Type 2 diabetes mellitus with other circulatory complications: Secondary | ICD-10-CM

## 2024-05-09 DIAGNOSIS — Z09 Encounter for follow-up examination after completed treatment for conditions other than malignant neoplasm: Secondary | ICD-10-CM

## 2024-05-09 LAB — POCT GLYCOSYLATED HEMOGLOBIN (HGB A1C): Hemoglobin A1C: 9.5 % — AB (ref 4.0–5.6)

## 2024-05-09 NOTE — Assessment & Plan Note (Signed)
 BP Readings from Last 3 Encounters:  05/09/24 126/84  05/07/24 125/74  05/04/24 129/84  Well-controlled hypertension Continue amlodipine  10 mg and Hyzaar 50-12.5 mg daily Hemoglobin A1c today at 9.4.  Better than before but still not at goal Cardiovascular risks associated with uncontrolled diabetes discussed Advised to continue Jardiance  10 mg daily, metformin  1000 mg twice a day, glipizide  5 mg twice a day and Trulicity  1.5 mg weekly Diet and nutrition discussed Follow-up in 3 months

## 2024-05-09 NOTE — Assessment & Plan Note (Signed)
Cardiovascular and cancer risk associated with smoking discussed. Smoking cessation advice given. 

## 2024-05-09 NOTE — Progress Notes (Signed)
 Paul Roberson 53 y.o.   Chief Complaint  Patient presents with   Diabetes    Here for follow up   Depression    Follow up, has not seen phycologist yet    HISTORY OF PRESENT ILLNESS: This is a 53 y.o. male here for follow-up of diabetes Lab Results  Component Value Date   HGBA1C 14.3 (H) 01/14/2024   Wt Readings from Last 3 Encounters:  05/09/24 215 lb 3.2 oz (97.6 kg)  05/04/24 219 lb (99.3 kg)  03/07/24 218 lb 4 oz (99 kg)   Here also for follow-up of emergency department visit on 05/06/2024 Assessment and plan as follows:  Abd pain Differential diagnosis considered.  Workup below. Patient with a history of diabetes, chronic pancreatitis and gastritis. CBC without leukocytosis or anemia.  CMP without significant electrolyte derangements.  Hyperglycemia without evidence of DKA.  Stable renal function.  No evidence of bili obstruction.  Mildly elevated lipase but not consistent with acute pancreatitis. Doubt any other suspicious intra-abdominal inflammatory/infectious process requiring imaging at this time. Provided with GI cocktail which provided relief   Back pain Tender to palpation in the periscapular region.  Likely muscular in etiology.  Chest x-ray negative for pneumonia, pneumothorax, pulmonary edema pleural effusions.   Final Clinical Impression(s) / ED Diagnoses Final diagnoses:  Strain of muscle at thorax level  Upper abdominal pain    The patient appears reasonably screened and/or stabilized for discharge and I doubt any other medical condition or other [facility] requiring further screening, evaluation, or treatment in the ED at this time. I have discussed the findings, Dx and Tx plan with the patient/family who expressed understanding and agree(s) with the plan. Discharge instructions discussed at length. The patient/family was given strict return precautions who verbalized understanding of the instructions. No further questions at time of discharge.   Disposition:  Discharge   Condition: Good   ED Discharge Orders             Ordered      ondansetron  (ZOFRAN -ODT) 4 MG disintegrating tablet  Every 8 hours PRN        05/07/24 0352      sucralfate (CARAFATE) 1 GM/10ML suspension  3 times daily with meals & bedtime        05/07/24 0352        Diabetes Pertinent negatives for hypoglycemia include no dizziness or headaches. Pertinent negatives for diabetes include no chest pain.  Depression        Associated symptoms include no headaches.    Prior to Admission medications   Medication Sig Start Date End Date Taking? Authorizing Provider  amLODipine  (NORVASC ) 10 MG tablet Take 1 tablet (10 mg total) by mouth daily. 01/14/24   Armenta Canning, MD  Aspirin-Salicylamide-Caffeine (BC HEADACHE POWDER PO) Take 1 packet by mouth 2 (two) times daily as needed (pain/headache).    [provider]  citalopram  (CELEXA ) 10 MG tablet Take 1 tablet (10 mg total) by mouth daily. 04/07/23   Purcell Emil Schanz, MD  Continuous Glucose Receiver (FREESTYLE LIBRE 2 READER) DEVI Dose,Route,Frequency: As Directed 01/14/24   Armenta Canning, MD  Continuous Glucose Sensor (FREESTYLE LIBRE 2 SENSOR) MISC Dose, Route Frequency: As Directed 01/14/24   Armenta Canning, MD  cyclobenzaprine  (FLEXERIL ) 10 MG tablet Take 1 tablet (10 mg total) by mouth 2 (two) times daily as needed for muscle spasms. 05/05/23   Schutt, Marsa HERO, PA-C  Dulaglutide  (TRULICITY ) 1.5 MG/0.5ML SOAJ Inject 1.5 mg into the skin once a  week. 01/20/24   Purcell Emil Schanz, MD  empagliflozin  (JARDIANCE ) 10 MG TABS tablet Take 1 tablet (10 mg total) by mouth daily. 01/20/24   Purcell Emil Schanz, MD  famotidine (PEPCID) 20 MG tablet Take 1 tablet (20 mg total) by mouth 2 (two) times daily as needed for heartburn or indigestion. 05/04/24   Kommor, Madison, MD  glipiZIDE  (GLUCOTROL ) 5 MG tablet Take 1 tablet (5 mg total) by mouth every morning. 01/14/24   Armenta Canning, MD  losartan -hydrochlorothiazide  (HYZAAR) 50-12.5 MG tablet Take 1 tablet by mouth daily. 01/14/24   Armenta Canning, MD  metFORMIN  (GLUCOPHAGE ) 1000 MG tablet Take 1 tablet (1,000 mg total) by mouth 2 (two) times daily with a meal. 01/14/24   Armenta Canning, MD  ondansetron  (ZOFRAN -ODT) 4 MG disintegrating tablet Take 1 tablet (4 mg total) by mouth every 8 (eight) hours as needed for up to 3 days for nausea or vomiting. 05/07/24 05/10/24  Trine Raynell Moder, MD  pantoprazole (PROTONIX) 20 MG tablet Take 1 tablet (20 mg total) by mouth daily. 05/04/24 06/03/24  Kommor, Madison, MD  rosuvastatin  (CRESTOR ) 5 MG tablet Take 1 tablet (5 mg total) by mouth at bedtime. 01/14/24 01/08/25  Armenta Canning, MD  sucralfate (CARAFATE) 1 GM/10ML suspension Take 10 mLs (1 g total) by mouth 4 (four) times daily -  with meals and at bedtime. 05/07/24   Trine Raynell Moder, MD    Allergies  Allergen Reactions   Sulfa Antibiotics Other (See Comments)    Causes real bad headache   Penicillins Itching    Patient Active Problem List   Diagnosis Date Noted   Adjustment reaction with anxiety and depression 03/07/2024   Acute kidney injury superimposed on chronic kidney disease (HCC) 01/13/2024   Spinal stenosis of lumbar region 05/12/2023   Moderate episode of recurrent major depressive disorder (HCC) 04/07/2023   Current smoker 07/01/2022   Chronic anemia 05/27/2022   Hypertension associated with type 2 diabetes mellitus (HCC) 05/28/2017   Type 2 diabetes mellitus without complication, without long-term current use of insulin  (HCC) 05/28/2017    Past Medical History:  Diagnosis Date   Diabetes mellitus without complication (HCC)    Hypertension     No past surgical history on file.  Social History   Socioeconomic History   Marital status: Legally Separated    Spouse name: Not on file   Number of children: Not on file   Years of education: Not on file   Highest education level: Not on file  Occupational History   Not on file   Tobacco Use   Smoking status: Some Days    Types: Cigarettes   Smokeless tobacco: Never  Vaping Use   Vaping status: Never Used  Substance and Sexual Activity   Alcohol use: Not Currently   Drug use: No   Sexual activity: Yes  Other Topics Concern   Not on file  Social History Narrative   Not on file   Social Drivers of Health   Financial Resource Strain: Not on file  Food Insecurity: Not on file  Transportation Needs: No Transportation Needs (06/05/2022)   PRAPARE - Transportation    Lack of Transportation (Medical): No    Lack of Transportation (Non-Medical): No  Physical Activity: Not on file  Stress: Not on file  Social Connections: Not on file  Intimate Partner Violence: Not on file    Family History  Problem Relation Age of Onset   Diabetes Mother    Heart disease Mother  Hypertension Mother    Asthma Mother    Diabetes Father    Heart disease Father    Hypertension Father    Kidney disease Father    Cancer Father      Review of Systems  Constitutional: Negative.  Negative for chills and fever.  HENT: Negative.  Negative for congestion and sore throat.   Respiratory: Negative.  Negative for cough and shortness of breath.   Cardiovascular: Negative.  Negative for chest pain and palpitations.  Gastrointestinal:  Negative for abdominal pain, diarrhea, nausea and vomiting.  Genitourinary: Negative.  Negative for dysuria and hematuria.  Skin: Negative.  Negative for rash.  Neurological:  Negative for dizziness and headaches.  Psychiatric/Behavioral:  Positive for depression.   All other systems reviewed and are negative.   Vitals:   05/09/24 1106  BP: 126/84  Pulse: 95  Resp: 16  Temp: 98.3 F (36.8 C)  SpO2: 99%    Physical Exam Vitals reviewed.  Constitutional:      Appearance: Normal appearance.  HENT:     Head: Normocephalic.     Mouth/Throat:     Mouth: Mucous membranes are moist.     Pharynx: Oropharynx is clear.   Eyes:      Extraocular Movements: Extraocular movements intact.     Pupils: Pupils are equal, round, and reactive to light.    Cardiovascular:     Rate and Rhythm: Normal rate and regular rhythm.     Pulses: Normal pulses.     Heart sounds: Normal heart sounds.  Pulmonary:     Effort: Pulmonary effort is normal.     Breath sounds: Normal breath sounds.   Musculoskeletal:     Cervical back: No tenderness.  Lymphadenopathy:     Cervical: No cervical adenopathy.   Skin:    General: Skin is warm and dry.     Capillary Refill: Capillary refill takes less than 2 seconds.   Neurological:     General: No focal deficit present.     Mental Status: He is alert and oriented to person, place, and time.   Psychiatric:        Mood and Affect: Mood normal.        Behavior: Behavior normal.    Results for orders placed or performed in visit on 05/09/24 (from the past 24 hours)  POCT HgB A1C     Status: Abnormal   Collection Time: 05/09/24 11:33 AM  Result Value Ref Range   Hemoglobin A1C 9.5 (A) 4.0 - 5.6 %   HbA1c POC (<> result, manual entry)     HbA1c, POC (prediabetic range)     HbA1c, POC (controlled diabetic range)        ASSESSMENT & PLAN: A total of 44 minutes was spent with the patient and counseling/coordination of care regarding preparing for this visit, review of most recent office visit notes, review of multiple chronic medical conditions and their management, cardiovascular risks associated with uncontrolled diabetes, review of all medications and changes made, review of most recent bloodwork results including interpretation of today's hemoglobin A1c, review of health maintenance items, education on nutrition, prognosis, documentation, and need for follow up.   Problem List Items Addressed This Visit       Cardiovascular and Mediastinum   Hypertension associated with type 2 diabetes mellitus (HCC) - Primary   BP Readings from Last 3 Encounters:  05/09/24 126/84  05/07/24 125/74   05/04/24 129/84  Well-controlled hypertension Continue amlodipine  10 mg and Hyzaar 50-12.5  mg daily Hemoglobin A1c today at 9.4.  Better than before but still not at goal Cardiovascular risks associated with uncontrolled diabetes discussed Advised to continue Jardiance  10 mg daily, metformin  1000 mg twice a day, glipizide  5 mg twice a day and Trulicity  1.5 mg weekly Diet and nutrition discussed Follow-up in 3 months        Endocrine   Type 2 diabetes mellitus without complication, without long-term current use of insulin  (HCC)   Relevant Orders   POCT HgB A1C (Completed)     Other   Current smoker   Cardiovascular and cancer risk associated with smoking discussed Smoking cessation advice given.      Adjustment reaction with anxiety and depression   Still struggling with both depression and anxiety Continue Celexa  10 mg daily Needs psychiatric evaluation. Referral was placed during last visit.  Will look into status of it.      Other Visit Diagnoses       Hospital discharge follow-up          Patient Instructions  Diabetes Mellitus and Nutrition, Adult When you have diabetes, or diabetes mellitus, it is very important to have healthy eating habits because your blood sugar (glucose) levels are greatly affected by what you eat and drink. Eating healthy foods in the right amounts, at about the same times every day, can help you: Manage your blood glucose. Lower your risk of heart disease. Improve your blood pressure. Reach or maintain a healthy weight. What can affect my meal plan? Every person with diabetes is different, and each person has different needs for a meal plan. Your health care provider may recommend that you work with a dietitian to make a meal plan that is best for you. Your meal plan may vary depending on factors such as: The calories you need. The medicines you take. Your weight. Your blood glucose, blood pressure, and cholesterol levels. Your activity  level. Other health conditions you have, such as heart or kidney disease. How do carbohydrates affect me? Carbohydrates, also called carbs, affect your blood glucose level more than any other type of food. Eating carbs raises the amount of glucose in your blood. It is important to know how many carbs you can safely have in each meal. This is different for every person. Your dietitian can help you calculate how many carbs you should have at each meal and for each snack. How does alcohol affect me? Alcohol can cause a decrease in blood glucose (hypoglycemia), especially if you use insulin  or take certain diabetes medicines by mouth. Hypoglycemia can be a life-threatening condition. Symptoms of hypoglycemia, such as sleepiness, dizziness, and confusion, are similar to symptoms of having too much alcohol. Do not drink alcohol if: Your health care provider tells you not to drink. You are pregnant, may be pregnant, or are planning to become pregnant. If you drink alcohol: Limit how much you have to: 0-1 drink a day for women. 0-2 drinks a day for men. Know how much alcohol is in your drink. In the [place] one drink equals one 12 oz bottle of beer (355 mL), one 5 oz glass of wine (148 mL), or one 1 oz glass of hard liquor (44 mL). Keep yourself hydrated with water, diet soda, or unsweetened iced tea. Keep in mind that regular soda, juice, and other mixers may contain a lot of sugar and must be counted as carbs. What are tips for following this plan?  Reading food labels Start by checking the serving size  on the Nutrition Facts label of packaged foods and drinks. The number of calories and the amount of carbs, fats, and other nutrients listed on the label are based on one serving of the item. Many items contain more than one serving per package. Check the total grams (g) of carbs in one serving. Check the number of grams of saturated fats and trans fats in one serving. Choose foods that have a low amount  or none of these fats. Check the number of milligrams (mg) of salt (sodium) in one serving. Most people should limit total sodium intake to less than 2,300 mg per day. Always check the nutrition information of foods labeled as low-fat or nonfat. These foods may be higher in added sugar or refined carbs and should be avoided. Talk to your dietitian to identify your daily goals for nutrients listed on the label. Shopping Avoid buying canned, pre-made, or processed foods. These foods tend to be high in fat, sodium, and added sugar. Shop around the outside edge of the grocery store. This is where you will most often find fresh fruits and vegetables, bulk grains, fresh meats, and fresh dairy products. Cooking Use low-heat cooking methods, such as baking, instead of high-heat cooking methods, such as deep frying. Cook using healthy oils, such as olive, canola, or sunflower oil. Avoid cooking with butter, cream, or high-fat meats. Meal planning Eat meals and snacks regularly, preferably at the same times every day. Avoid going long periods of time without eating. Eat foods that are high in fiber, such as fresh fruits, vegetables, beans, and whole grains. Eat 4-6 oz (112-168 g) of lean protein each day, such as lean meat, chicken, fish, eggs, or tofu. One ounce (oz) (28 g) of lean protein is equal to: 1 oz (28 g) of meat, chicken, or fish. 1 egg.  cup (62 g) of tofu. Eat some foods each day that contain healthy fats, such as avocado, nuts, seeds, and fish. What foods should I eat? Fruits Berries. Apples. Oranges. Peaches. Apricots. Plums. Grapes. Mangoes. Papayas. Pomegranates. Kiwi. Cherries. Vegetables Leafy greens, including lettuce, spinach, kale, chard, collard greens, mustard greens, and cabbage. Beets. Cauliflower. Broccoli. Carrots. [place] beans. Tomatoes. Peppers. Onions. Cucumbers. Brussels sprouts. Grains Whole grains, such as whole-wheat or whole-grain bread, crackers, tortillas,  cereal, and pasta. Unsweetened oatmeal. Quinoa. Brown or wild rice. Meats and other proteins Seafood. Poultry without skin. Lean cuts of poultry and beef. Tofu. Nuts. Seeds. Dairy Low-fat or fat-free dairy products such as milk, yogurt, and cheese. The items listed above may not be a complete list of foods and beverages you can eat and drink. Contact a dietitian for more information. What foods should I avoid? Fruits Fruits canned with syrup. Vegetables Canned vegetables. Frozen vegetables with butter or cream sauce. Grains Refined white flour and flour products such as bread, pasta, snack foods, and cereals. Avoid all processed foods. Meats and other proteins Fatty cuts of meat. Poultry with skin. Breaded or fried meats. Processed meat. Avoid saturated fats. Dairy Full-fat yogurt, cheese, or milk. Beverages Sweetened drinks, such as soda or iced tea. The items listed above may not be a complete list of foods and beverages you should avoid. Contact a dietitian for more information. Questions to ask a health care provider Do I need to meet with a certified diabetes care and education specialist? Do I need to meet with a dietitian? What number can I call if I have questions? When are the best times to check my blood glucose? Where  to find more information: American Diabetes Association: diabetes.org Academy of Nutrition and Dietetics: eatright.[employer] of Diabetes and Digestive and Kidney Diseases: StageSync.si Association of Diabetes Care & Education Specialists: diabeteseducator.org Summary It is important to have healthy eating habits because your blood sugar (glucose) levels are greatly affected by what you eat and drink. It is important to use alcohol carefully. A healthy meal plan will help you manage your blood glucose and lower your risk of heart disease. Your health care provider may recommend that you work with a dietitian to make a meal plan that is best for  you. This information is not intended to replace advice given to you by your health care provider. Make sure you discuss any questions you have with your health care provider. Document Revised: 06/05/2020 Document Reviewed: 06/06/2020 Elsevier Patient Education  2024 Elsevier Inc.   Emil Schaumann, MD [facility]

## 2024-05-09 NOTE — Patient Instructions (Signed)

## 2024-05-09 NOTE — Assessment & Plan Note (Signed)
 Still struggling with both depression and anxiety Continue Celexa  10 mg daily Needs psychiatric evaluation. Referral was placed during last visit.  Will look into status of it.

## 2024-08-10 ENCOUNTER — Ambulatory Visit: Admitting: Emergency Medicine

## 2024-09-26 ENCOUNTER — Ambulatory Visit (HOSPITAL_COMMUNITY)
Admission: EM | Admit: 2024-09-26 | Discharge: 2024-09-26 | Disposition: A | Discharge status: Home / Self Care | Attending: Physician Assistant | Admitting: Physician Assistant

## 2024-09-26 ENCOUNTER — Encounter (HOSPITAL_COMMUNITY): Payer: Self-pay | Admitting: *Deleted

## 2024-09-26 DIAGNOSIS — M546 Pain in thoracic spine: Secondary | ICD-10-CM | POA: Diagnosis not present

## 2024-09-26 DIAGNOSIS — R1013 Epigastric pain: Secondary | ICD-10-CM | POA: Diagnosis not present

## 2024-09-26 LAB — COMPREHENSIVE METABOLIC PANEL WITH GFR
ALT: 18 U/L (ref 0–44)
AST: 20 U/L (ref 15–41)
Albumin: 3.9 g/dL (ref 3.5–5.0)
Alkaline Phosphatase: 81 U/L (ref 38–126)
Anion gap: 15 (ref 5–15)
BUN: 13 mg/dL (ref 6–20)
CO2: 25 mmol/L (ref 22–32)
Calcium: 9.9 mg/dL (ref 8.9–10.3)
Chloride: 100 mmol/L (ref 98–111)
Creatinine, Ser: 1.47 mg/dL — ABNORMAL HIGH (ref 0.61–1.24)
GFR, Estimated: 57 mL/min — ABNORMAL LOW (ref >60)
Glucose, Bld: 305 mg/dL — ABNORMAL HIGH (ref 70–99)
Potassium: 3.8 mmol/L (ref 3.5–5.1)
Sodium: 140 mmol/L (ref 135–145)
Total Bilirubin: 0.8 mg/dL (ref 0.0–1.2)
Total Protein: 7.7 g/dL (ref 6.5–8.1)

## 2024-09-26 LAB — CBC WITH DIFFERENTIAL/PLATELET
Abs Immature Granulocytes: 0.01 K/uL (ref 0.00–0.07)
Basophils Absolute: 0 K/uL (ref 0.0–0.1)
Basophils Relative: 0 %
Eosinophils Absolute: 0.1 K/uL (ref 0.0–0.5)
Eosinophils Relative: 1 %
HCT: 43.3 % (ref 39.0–52.0)
Hemoglobin: 14 g/dL (ref 13.0–17.0)
Immature Granulocytes: 0 %
Lymphocytes Relative: 28 %
Lymphs Abs: 1.9 K/uL (ref 0.7–4.0)
MCH: 28.7 pg (ref 26.0–34.0)
MCHC: 32.3 g/dL (ref 30.0–36.0)
MCV: 88.9 fL (ref 80.0–100.0)
Monocytes Absolute: 0.6 K/uL (ref 0.1–1.0)
Monocytes Relative: 8 %
Neutro Abs: 4.2 K/uL (ref 1.7–7.7)
Neutrophils Relative %: 63 %
Platelets: 281 K/uL (ref 150–400)
RBC: 4.87 MIL/uL (ref 4.22–5.81)
RDW: 13.2 % (ref 11.5–15.5)
WBC: 6.8 K/uL (ref 4.0–10.5)
nRBC: 0 % (ref 0.0–0.2)

## 2024-09-26 LAB — LIPASE, BLOOD: Lipase: 51 U/L (ref 11–51)

## 2024-09-26 MED ORDER — ACETAMINOPHEN 325 MG PO TABS
975.0000 mg | ORAL_TABLET | Freq: Once | ORAL | Status: AC
  Administered 2024-09-26: 975 mg via ORAL

## 2024-09-26 MED ORDER — ACETAMINOPHEN 325 MG PO TABS
ORAL_TABLET | ORAL | Status: AC
  Filled 2024-09-26: qty 3

## 2024-09-26 NOTE — ED Triage Notes (Addendum)
 Pt states he has back pain X 5 days. He has been taking tylenol  and muscle relaxer without relief. He states he did eat some fried food on Friday and he vomited.   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

## 2024-09-26 NOTE — ED Provider Notes (Signed)
 [facility]    CSN: [id number] Arrival date & time: 09/26/24  1041      History   Chief Complaint Chief Complaint  Patient presents with   Back Pain    HPI Paul Roberson is a 53 y.o. male.  has a past medical history of Diabetes mellitus without complication (HCC) and Hypertension.   HPI  Discussed the use of AI scribe software for clinical note transcription with the patient, who gave verbal consent to proceed.  The patient, with a history of pancreatitis, presents with upper and lower back pain and nausea.  He has been experiencing pain in the upper front and back, as well as in the lower back, for about four to five days. The pain sometimes radiates upwards and is described as a constant seven out of ten in severity. 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 The pain feels similar to when he previously had pancreatitis.  He has experienced nausea and vomiting, which began after eating on Friday. Vomiting occurred on Thursday and Friday, but has not persisted over the past two days. He has not eaten much recently due to discomfort. He had a bowel movement yesterday, which appeared normal. No blood in the stool or pain during urination.  He mentions a history of back pain and notes discomfort when lifting his legs, particularly in the lower back. He has not had his A1c checked recently.     Past Medical History:  Diagnosis Date   Diabetes mellitus without complication (HCC)    Hypertension     Patient Active Problem List   Diagnosis Date Noted   Adjustment reaction with anxiety and depression 03/07/2024   Spinal stenosis of lumbar region 05/12/2023   Moderate episode of recurrent major depressive disorder (HCC) 04/07/2023   Current smoker 07/01/2022   Chronic anemia 05/27/2022   Hypertension associated with type 2 diabetes mellitus (HCC) 05/28/2017   Type 2 diabetes  mellitus without complication, without long-term current use of insulin  (HCC) 05/28/2017    History reviewed. No pertinent surgical history.     Home Medications    Prior to Admission medications   Medication Sig Start Date End Date Taking? Authorizing Provider  amLODipine  (NORVASC ) 10 MG tablet Take 1 tablet (10 mg total) by mouth daily. 01/14/24  Yes Armenta Canning, MD  cyclobenzaprine  (FLEXERIL ) 10 MG tablet Take 1 tablet (10 mg total) by mouth 2 (two) times daily as needed for muscle spasms. 05/05/23  Yes Schutt, Marsa HERO, PA-C  Dulaglutide  (TRULICITY ) 1.5 MG/0.5ML SOAJ Inject 1.5 mg into the skin once a week. 01/20/24  Yes Sagardia, Emil Schanz, MD  empagliflozin  (JARDIANCE ) 10 MG TABS tablet Take 1 tablet (10 mg total) by mouth daily. 01/20/24  Yes Sagardia, Emil Schanz, MD  glipiZIDE  (GLUCOTROL ) 5 MG tablet Take 1 tablet (5 mg total) by mouth every morning. 01/14/24  Yes Armenta Canning, MD  losartan -hydrochlorothiazide (HYZAAR) 50-12.5 MG tablet Take 1 tablet by mouth daily. 01/14/24  Yes Armenta Canning, MD  metFORMIN  (GLUCOPHAGE ) 1000 MG tablet Take 1 tablet (1,000 mg total) by mouth 2 (two) times daily with a meal. 01/14/24  Yes Pfeiffer, Canning, MD  rosuvastatin  (CRESTOR ) 5 MG tablet Take 1 tablet (5 mg total) by mouth at bedtime. 01/14/24 01/08/25 Yes Armenta Canning, MD  Aspirin-Salicylamide-Caffeine (BC HEADACHE POWDER PO) Take 1 packet by mouth 2 (two) times daily as needed (pain/headache).    [provider]  citalopram  (CELEXA ) 10 MG tablet Take 1 tablet (10 mg total) by mouth daily. 04/07/23   Purcell Emil Schanz, MD  Continuous Glucose Receiver (FREESTYLE LIBRE 2 READER) DEVI Dose,Route,Frequency: As Directed 01/14/24   Armenta Canning, MD  Continuous Glucose Sensor (FREESTYLE LIBRE 2 SENSOR) MISC Dose, Route Frequency: As Directed 01/14/24   Armenta Canning, MD  famotidine  (PEPCID ) 20 MG tablet Take 1 tablet (20 mg total) by mouth 2 (two) times daily as needed for  heartburn or indigestion. 05/04/24   Kommor, Madison, MD  pantoprazole  (PROTONIX ) 20 MG tablet Take 1 tablet (20 mg total) by mouth daily. 05/04/24 06/03/24  Kommor, Madison, MD  sucralfate  (CARAFATE ) 1 GM/10ML suspension Take 10 mLs (1 g total) by mouth 4 (four) times daily -  with meals and at bedtime. 05/07/24   Cardama, Raynell Moder, MD    Family History Family History  Problem Relation Age of Onset   Diabetes Mother    Heart disease Mother    Hypertension Mother    Asthma Mother    Diabetes Father    Heart disease Father    Hypertension Father    Kidney disease Father    Cancer Father     Social History Social History   Tobacco Use   Smoking status: Some Days    Types: Cigarettes   Smokeless tobacco: Never  Vaping Use   Vaping status: Never Used  Substance Use Topics   Alcohol use: Not Currently   Drug use: No     Allergies   Sulfa antibiotics and Penicillins   Review of Systems Review of Systems  Musculoskeletal:  Positive for back pain.     Physical Exam Triage Vital Signs ED Triage Vitals  Encounter Vitals Group     BP 09/26/24 1239 (!) 143/90     Girls Systolic BP Percentile --      Girls Diastolic BP Percentile --      Boys Systolic BP Percentile --      Boys Diastolic BP Percentile --      Pulse Rate 09/26/24 1239 98     Resp 09/26/24 1239 18     Temp 09/26/24 1239 98.1 F (36.7 C)     Temp Source 09/26/24 1239 Oral     SpO2 09/26/24 1239 97 %     Weight --      Height --      Head Circumference --      Peak Flow --      Pain Score 09/26/24 1237 7     Pain Loc --      Pain Education --      Exclude from Growth Chart --    No data found.  Updated Vital Signs BP (!) 143/90 (BP Location: Left Arm)   Pulse 98   Temp 98.1 F (36.7 C) (Oral)   Resp 18   SpO2 97%   Visual Acuity Right Eye Distance:   Left Eye Distance:   Bilateral Distance:    Right Eye Near:   Left Eye Near:    Bilateral Near:     Physical Exam Vitals reviewed.   Constitutional:      General: He is awake. He is not in acute distress.    Appearance: Normal appearance. He is well-developed and well-groomed. He is not ill-appearing, toxic-appearing or diaphoretic.  HENT:     Head: Normocephalic and atraumatic.  Eyes:     Extraocular Movements: Extraocular movements intact.     Conjunctiva/sclera: Conjunctivae normal.  Cardiovascular:     Rate and Rhythm: Normal rate and regular rhythm.     Heart sounds: Normal heart sounds. No murmur heard.    No friction rub. No gallop.  Pulmonary:     Effort: Pulmonary effort is normal.     Breath sounds: Normal breath sounds. No decreased air movement. No decreased breath sounds, wheezing, rhonchi or rales.  Abdominal:     General: Abdomen is flat. Bowel sounds are normal.     Palpations: Abdomen is soft.     Tenderness: There is abdominal tenderness in the right upper quadrant and epigastric area. There is guarding. There is no rebound. Negative signs include Murphy's sign and McBurney's sign.  Musculoskeletal:     Cervical back: Normal range of motion.     Comments: ROM findings Thoracic: Lateral flexion and lateral rotation are intact and symmetrical Lumbar: Extension, flexion are intact Hips: Extension, flexion, abduction, adduction are symmetrical and intact He reports back pain with returning upright from lumbar flexion and pain with bilateral hip flexion- states there is a pulling sensation in the back   Neurological:     Mental Status: He is alert and oriented to person, place, and time.  Psychiatric:        Attention and Perception: Attention normal.        Mood and Affect: Mood normal.        Speech: Speech normal.        Behavior: Behavior normal. Behavior is cooperative.        Thought Content: Thought content normal.        Judgment: Judgment normal.      UC Treatments / Results  Labs (all labs ordered are listed, but only abnormal results are displayed) Labs Reviewed  LIPASE, BLOOD   COMPREHENSIVE METABOLIC PANEL WITH GFR  CBC WITH DIFFERENTIAL/PLATELET    EKG   Radiology No results found.  Procedures Procedures (including critical care time)  Medications Ordered in UC Medications  acetaminophen  (TYLENOL ) tablet 975 mg (975 mg Oral Given 09/26/24 1341)    Initial Impression / Assessment and Plan / UC Course  I have reviewed the triage vital signs and the nursing notes.  Pertinent labs & imaging results that were available during my care of the patient were reviewed by me and considered in my medical decision making (see chart for details).     Pt has previous hx of diabetes, most recent A1c of 9.5 as of labs completed on 05/09/24.  eGFR of 42 per labs from 05/07/24     Final Clinical Impressions(s) / UC Diagnoses   Final diagnoses:  Epigastric pain  Acute bilateral thoracic back pain    Acute abdominal pain with back pain, possible pancreatitis Acute abdominal pain with associated back pain for 4-5 days, exacerbated by lifting and twisting. Pain is constant, rated 7/10, with nausea and vomiting since Thursday. Differential includes pancreatitis due to similar past episodes, acute oblique injury or radiating thoracic/lumbar back pain. No recent injuries reported. - Ordered lab work to assess for pancreatitis- lipase, CBC and CMP - Advised to go to the emergency room if pain worsens or if lab results indicate pancreatitis flare- will keep patient updated on results once available   Low back pain Chronic low back pain exacerbated by lifting and twisting.  - Recommended Tylenol  for pain management - Advised use of Voltaren gel and warm compresses due to most recent eGFR results and high A1c  - Advised to avoid ibuprofen  or other NSAIDs until  lab results confirm kidney function  Type 2 diabetes mellitus Elevated A1c limits use of steroids for inflammation management.  Chronic kidney disease Limits use of NSAIDs like ibuprofen  or Toradol  until kidney  function is confirmed with lab results. - Await lab results to confirm kidney function before prescribing NSAIDs    Discharge Instructions      VISIT SUMMARY:  You came in today with upper and lower back pain and nausea. You have been experiencing this pain for about four to five days, and it sometimes radiates upwards. The pain is constant and severe, and you have also had nausea and vomiting. 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 You have a history of pancreatitis and mentioned that the pain feels similar to previous episodes. You also have a history of back pain, diabetes, and kidney issues.  YOUR PLAN:  -ACUTE ABDOMINAL PAIN WITH BACK PAIN, POSSIBLE PANCREATITIS: Acute abdominal pain with back pain can be a sign of pancreatitis, which is inflammation of the pancreas. We have ordered lab work to check for pancreatitis. If your pain worsens or if the lab results indicate a pancreatitis flare, you should go to the emergency room immediately.  -LOW BACK PAIN: Your chronic low back pain has been made worse by lifting and twisting. We recommend using Tylenol  for pain management, applying Voltaren gel, and using warm compresses. Please avoid using ibuprofen  or other NSAIDs until we confirm your kidney function with lab results.  -TYPE 2 DIABETES MELLITUS: Type 2 diabetes is a condition where your blood sugar levels are too high. Your elevated A1c limits the use of steroids for managing inflammation.  -CHRONIC KIDNEY DISEASE: Chronic kidney disease means your kidneys are not functioning as well as they should. This limits the use of certain pain medications like ibuprofen  or other NSAIDs until we confirm your kidney function with lab results.  INSTRUCTIONS:  We have ordered lab work to assess for pancreatitis and to confirm your kidney function. Please go to the emergency room if your pain worsens or if the lab  results indicate a pancreatitis flare.     ED Prescriptions   None    PDMP not reviewed this encounter.   Marylene Rocky BRAVO, PA-C 09/26/24 1423

## 2024-09-26 NOTE — Discharge Instructions (Addendum)
 VISIT SUMMARY:  You came in today with upper and lower back pain and nausea. You have been experiencing this pain for about four to five days, and it sometimes radiates upwards. The pain is constant and severe, and you have also had nausea and vomiting. 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 You have a history of pancreatitis and mentioned that the pain feels similar to previous episodes. You also have a history of back pain, diabetes, and kidney issues.  YOUR PLAN:  -ACUTE ABDOMINAL PAIN WITH BACK PAIN, POSSIBLE PANCREATITIS: Acute abdominal pain with back pain can be a sign of pancreatitis, which is inflammation of the pancreas. We have ordered lab work to check for pancreatitis. If your pain worsens or if the lab results indicate a pancreatitis flare, you should go to the emergency room immediately.  -LOW BACK PAIN: Your chronic low back pain has been made worse by lifting and twisting. We recommend using Tylenol  for pain management, applying Voltaren gel, and using warm compresses. Please avoid using ibuprofen  or other NSAIDs until we confirm your kidney function with lab results.  -TYPE 2 DIABETES MELLITUS: Type 2 diabetes is a condition where your blood sugar levels are too high. Your elevated A1c limits the use of steroids for managing inflammation.  -CHRONIC KIDNEY DISEASE: Chronic kidney disease means your kidneys are not functioning as well as they should. This limits the use of certain pain medications like ibuprofen  or other NSAIDs until we confirm your kidney function with lab results.  INSTRUCTIONS:  We have ordered lab work to assess for pancreatitis and to confirm your kidney function. Please go to the emergency room if your pain worsens or if the lab results indicate a pancreatitis flare.

## 2024-09-27 ENCOUNTER — Ambulatory Visit (HOSPITAL_COMMUNITY): Payer: Self-pay

## 2024-12-23 ENCOUNTER — Encounter: Payer: Self-pay | Admitting: *Deleted

## 2024-12-23 NOTE — Progress Notes (Signed)
 Paul Roberson                                          MRN: [id number]   12/23/2024   The [facility] Quality Team Specialist reviewed this patient medical record for the purposes of chart review for care gap closure. The following were reviewed: abstraction for care gap closure-kidney health evaluation for diabetes:eGFR  and uACR.    [facility] Quality Team

## 2024-12-23 NOTE — Progress Notes (Signed)
 KRESTON AHRENDT                                          MRN: [id number]   12/23/2024   The [facility] Quality Team Specialist reviewed this patient medical record for the purposes of chart review for care gap closure. The following were reviewed: chart review for care gap closure-glycemic status assessment.    [facility] Quality Team
